# Patient Record
Sex: Female | Born: 1955 | Race: White | Hispanic: No
Health system: Midwestern US, Community
[De-identification: ages and names within clinical notes are randomized; demographics above are authoritative.]

## PROBLEM LIST (undated history)

## (undated) DIAGNOSIS — R0683 Snoring: Secondary | ICD-10-CM

## (undated) DIAGNOSIS — R0602 Shortness of breath: Secondary | ICD-10-CM

## (undated) DIAGNOSIS — I10 Essential (primary) hypertension: Secondary | ICD-10-CM

## (undated) DIAGNOSIS — IMO0002 Reserved for concepts with insufficient information to code with codable children: Secondary | ICD-10-CM

## (undated) DIAGNOSIS — E119 Type 2 diabetes mellitus without complications: Secondary | ICD-10-CM

## (undated) DIAGNOSIS — M25569 Pain in unspecified knee: Secondary | ICD-10-CM

## (undated) DIAGNOSIS — G2581 Restless legs syndrome: Secondary | ICD-10-CM

## (undated) DIAGNOSIS — M2550 Pain in unspecified joint: Secondary | ICD-10-CM

## (undated) DIAGNOSIS — R7303 Prediabetes: Secondary | ICD-10-CM

## (undated) DIAGNOSIS — E559 Vitamin D deficiency, unspecified: Secondary | ICD-10-CM

## (undated) DIAGNOSIS — M5430 Sciatica, unspecified side: Secondary | ICD-10-CM

## (undated) DIAGNOSIS — M797 Fibromyalgia: Secondary | ICD-10-CM

## (undated) DIAGNOSIS — R600 Localized edema: Secondary | ICD-10-CM

## (undated) DIAGNOSIS — M1990 Unspecified osteoarthritis, unspecified site: Secondary | ICD-10-CM

## (undated) DIAGNOSIS — K5900 Constipation, unspecified: Secondary | ICD-10-CM

## (undated) DIAGNOSIS — R519 Headache, unspecified: Secondary | ICD-10-CM

## (undated) DIAGNOSIS — M419 Scoliosis, unspecified: Secondary | ICD-10-CM

## (undated) DIAGNOSIS — F32A Depression, unspecified: Secondary | ICD-10-CM

## (undated) DIAGNOSIS — M549 Dorsalgia, unspecified: Secondary | ICD-10-CM

## (undated) HISTORY — DX: Localized edema: R60.0

## (undated) HISTORY — DX: Pain in unspecified joint: M25.50

## (undated) HISTORY — DX: Unspecified osteoarthritis, unspecified site: M19.90

## (undated) HISTORY — DX: Fibromyalgia: M79.7

## (undated) HISTORY — DX: Vitamin D deficiency, unspecified: E55.9

## (undated) HISTORY — PX: TUBAL LIGATION: SHX77

## (undated) HISTORY — DX: Prediabetes: R73.03

## (undated) HISTORY — PX: ABDOMINAL HYSTERECTOMY: SHX81

## (undated) HISTORY — PX: BACK SURGERY: SHX140

## (undated) HISTORY — DX: Pain in unspecified knee: M25.569

## (undated) HISTORY — DX: Depression, unspecified: F32.A

## (undated) HISTORY — DX: Dorsalgia, unspecified: M54.9

## (undated) HISTORY — DX: Scoliosis, unspecified: M41.9

## (undated) HISTORY — DX: Shortness of breath: R06.02

## (undated) HISTORY — DX: Restless legs syndrome: G25.81

## (undated) HISTORY — DX: Type 2 diabetes mellitus without complications: E11.9

## (undated) HISTORY — DX: Constipation, unspecified: K59.00

---

## 1997-09-01 HISTORY — PX: ABDOMINAL HYSTERECTOMY: SHX81

## 1998-09-01 DIAGNOSIS — D869 Sarcoidosis, unspecified: Secondary | ICD-10-CM

## 1998-09-01 HISTORY — DX: Sarcoidosis, unspecified: D86.9

## 2002-08-30 ENCOUNTER — Emergency Department (HOSPITAL_COMMUNITY): Admission: EM | Admit: 2002-08-30 | Discharge: 2002-08-31 | Payer: Self-pay | Admitting: *Deleted

## 2005-04-17 ENCOUNTER — Ambulatory Visit: Payer: Self-pay | Admitting: Family Medicine

## 2005-04-25 ENCOUNTER — Ambulatory Visit: Payer: Self-pay | Admitting: *Deleted

## 2005-04-25 ENCOUNTER — Ambulatory Visit (HOSPITAL_COMMUNITY): Admission: RE | Admit: 2005-04-25 | Discharge: 2005-04-25 | Payer: Self-pay | Admitting: Family Medicine

## 2005-04-25 ENCOUNTER — Ambulatory Visit: Payer: Self-pay | Admitting: Family Medicine

## 2005-07-02 ENCOUNTER — Encounter (INDEPENDENT_AMBULATORY_CARE_PROVIDER_SITE_OTHER): Payer: Self-pay | Admitting: Internal Medicine

## 2005-07-18 ENCOUNTER — Ambulatory Visit: Payer: Self-pay | Admitting: Family Medicine

## 2005-07-18 ENCOUNTER — Encounter (INDEPENDENT_AMBULATORY_CARE_PROVIDER_SITE_OTHER): Payer: Self-pay | Admitting: Family Medicine

## 2005-07-30 ENCOUNTER — Ambulatory Visit: Payer: Self-pay | Admitting: Internal Medicine

## 2005-09-11 ENCOUNTER — Ambulatory Visit: Payer: Self-pay | Admitting: Family Medicine

## 2005-09-11 ENCOUNTER — Ambulatory Visit (HOSPITAL_COMMUNITY): Admission: RE | Admit: 2005-09-11 | Discharge: 2005-09-11 | Payer: Self-pay | Admitting: Family Medicine

## 2005-09-22 ENCOUNTER — Ambulatory Visit: Payer: Self-pay | Admitting: Family Medicine

## 2005-09-25 ENCOUNTER — Ambulatory Visit (HOSPITAL_COMMUNITY): Admission: RE | Admit: 2005-09-25 | Discharge: 2005-09-25 | Payer: Self-pay | Admitting: Family Medicine

## 2005-10-23 ENCOUNTER — Ambulatory Visit: Payer: Self-pay | Admitting: Family Medicine

## 2006-06-04 ENCOUNTER — Ambulatory Visit: Payer: Self-pay | Admitting: Family Medicine

## 2006-06-25 ENCOUNTER — Ambulatory Visit: Payer: Self-pay | Admitting: Family Medicine

## 2006-07-02 ENCOUNTER — Ambulatory Visit: Payer: Self-pay | Admitting: Family Medicine

## 2007-02-28 ENCOUNTER — Emergency Department (HOSPITAL_COMMUNITY): Admission: EM | Admit: 2007-02-28 | Discharge: 2007-02-28 | Payer: Self-pay | Admitting: Emergency Medicine

## 2007-05-07 ENCOUNTER — Encounter (INDEPENDENT_AMBULATORY_CARE_PROVIDER_SITE_OTHER): Payer: Self-pay | Admitting: Internal Medicine

## 2007-05-13 DIAGNOSIS — K069 Disorder of gingiva and edentulous alveolar ridge, unspecified: Secondary | ICD-10-CM

## 2007-05-13 DIAGNOSIS — K056 Periodontal disease, unspecified: Secondary | ICD-10-CM | POA: Insufficient documentation

## 2007-05-13 DIAGNOSIS — I10 Essential (primary) hypertension: Secondary | ICD-10-CM

## 2007-05-13 DIAGNOSIS — T7840XA Allergy, unspecified, initial encounter: Secondary | ICD-10-CM | POA: Insufficient documentation

## 2007-05-13 DIAGNOSIS — E1159 Type 2 diabetes mellitus with other circulatory complications: Secondary | ICD-10-CM | POA: Insufficient documentation

## 2007-05-13 DIAGNOSIS — R319 Hematuria, unspecified: Secondary | ICD-10-CM | POA: Insufficient documentation

## 2007-05-13 DIAGNOSIS — F329 Major depressive disorder, single episode, unspecified: Secondary | ICD-10-CM | POA: Insufficient documentation

## 2007-05-19 ENCOUNTER — Encounter (INDEPENDENT_AMBULATORY_CARE_PROVIDER_SITE_OTHER): Payer: Self-pay | Admitting: *Deleted

## 2007-12-14 ENCOUNTER — Ambulatory Visit: Payer: Self-pay | Admitting: Family Medicine

## 2007-12-14 ENCOUNTER — Ambulatory Visit (HOSPITAL_COMMUNITY): Admission: RE | Admit: 2007-12-14 | Discharge: 2007-12-14 | Payer: Self-pay | Admitting: Family Medicine

## 2007-12-14 DIAGNOSIS — M542 Cervicalgia: Secondary | ICD-10-CM | POA: Insufficient documentation

## 2007-12-14 DIAGNOSIS — R1084 Generalized abdominal pain: Secondary | ICD-10-CM | POA: Insufficient documentation

## 2007-12-14 DIAGNOSIS — K625 Hemorrhage of anus and rectum: Secondary | ICD-10-CM | POA: Insufficient documentation

## 2007-12-14 LAB — CONVERTED CEMR LAB
Albumin: 4.4 g/dL (ref 3.5–5.2)
BUN: 12 mg/dL (ref 6–23)
Calcium: 10 mg/dL (ref 8.4–10.5)
Chloride: 105 meq/L (ref 96–112)
Glucose, Bld: 104 mg/dL — ABNORMAL HIGH (ref 70–99)
Lymphocytes Relative: 34 % (ref 12–46)
Lymphs Abs: 2.9 10*3/uL (ref 0.7–4.0)
Monocytes Relative: 6 % (ref 3–12)
Neutro Abs: 5 10*3/uL (ref 1.7–7.7)
Neutrophils Relative %: 58 % (ref 43–77)
Potassium: 3.9 meq/L (ref 3.5–5.3)
RBC: 4.78 M/uL (ref 3.87–5.11)
WBC: 8.6 10*3/uL (ref 4.0–10.5)

## 2007-12-21 ENCOUNTER — Ambulatory Visit: Payer: Self-pay | Admitting: Family Medicine

## 2007-12-21 DIAGNOSIS — D869 Sarcoidosis, unspecified: Secondary | ICD-10-CM | POA: Insufficient documentation

## 2007-12-21 DIAGNOSIS — R5381 Other malaise: Secondary | ICD-10-CM | POA: Insufficient documentation

## 2007-12-21 DIAGNOSIS — R5383 Other fatigue: Secondary | ICD-10-CM

## 2007-12-22 ENCOUNTER — Ambulatory Visit (HOSPITAL_COMMUNITY): Admission: RE | Admit: 2007-12-22 | Discharge: 2007-12-22 | Payer: Self-pay | Admitting: Family Medicine

## 2007-12-28 ENCOUNTER — Ambulatory Visit (HOSPITAL_COMMUNITY): Admission: RE | Admit: 2007-12-28 | Discharge: 2007-12-28 | Payer: Self-pay | Admitting: Family Medicine

## 2007-12-28 ENCOUNTER — Encounter (INDEPENDENT_AMBULATORY_CARE_PROVIDER_SITE_OTHER): Payer: Self-pay | Admitting: Family Medicine

## 2008-01-04 ENCOUNTER — Ambulatory Visit: Payer: Self-pay | Admitting: Family Medicine

## 2008-01-04 LAB — CONVERTED CEMR LAB
Rhuematoid fact SerPl-aCnc: <20 intl units/mL (ref 0–20)
Sed Rate: 30 mm/hr — ABNORMAL HIGH (ref 0–22)

## 2008-01-05 ENCOUNTER — Ambulatory Visit (HOSPITAL_COMMUNITY): Admission: RE | Admit: 2008-01-05 | Discharge: 2008-01-05 | Payer: Self-pay | Admitting: Family Medicine

## 2008-01-25 ENCOUNTER — Ambulatory Visit: Payer: Self-pay | Admitting: Family Medicine

## 2008-05-29 ENCOUNTER — Ambulatory Visit: Payer: Self-pay | Admitting: Family Medicine

## 2008-05-29 DIAGNOSIS — M545 Low back pain, unspecified: Secondary | ICD-10-CM | POA: Insufficient documentation

## 2008-06-22 ENCOUNTER — Telehealth (INDEPENDENT_AMBULATORY_CARE_PROVIDER_SITE_OTHER): Payer: Self-pay | Admitting: Family Medicine

## 2008-06-26 ENCOUNTER — Emergency Department (HOSPITAL_COMMUNITY): Admission: EM | Admit: 2008-06-26 | Discharge: 2008-06-26 | Payer: Self-pay | Admitting: Emergency Medicine

## 2008-07-06 ENCOUNTER — Encounter (INDEPENDENT_AMBULATORY_CARE_PROVIDER_SITE_OTHER): Payer: Self-pay | Admitting: *Deleted

## 2008-10-05 ENCOUNTER — Emergency Department (HOSPITAL_COMMUNITY): Admission: EM | Admit: 2008-10-05 | Discharge: 2008-10-05 | Payer: Self-pay | Admitting: Emergency Medicine

## 2010-09-21 ENCOUNTER — Encounter: Payer: Self-pay | Admitting: Family Medicine

## 2010-09-22 ENCOUNTER — Encounter: Payer: Self-pay | Admitting: Family Medicine

## 2013-03-02 ENCOUNTER — Encounter (HOSPITAL_COMMUNITY): Payer: Self-pay | Admitting: Family Medicine

## 2013-03-02 ENCOUNTER — Emergency Department (HOSPITAL_COMMUNITY)
Admission: EM | Admit: 2013-03-02 | Discharge: 2013-03-03 | Disposition: A | Discharge status: Home / Self Care | Payer: Medicaid Other | Attending: Emergency Medicine | Admitting: Emergency Medicine

## 2013-03-02 ENCOUNTER — Emergency Department (HOSPITAL_COMMUNITY): Payer: Medicaid Other

## 2013-03-02 DIAGNOSIS — M545 Low back pain, unspecified: Secondary | ICD-10-CM | POA: Insufficient documentation

## 2013-03-02 DIAGNOSIS — IMO0002 Reserved for concepts with insufficient information to code with codable children: Secondary | ICD-10-CM | POA: Insufficient documentation

## 2013-03-02 DIAGNOSIS — F17200 Nicotine dependence, unspecified, uncomplicated: Secondary | ICD-10-CM | POA: Insufficient documentation

## 2013-03-02 DIAGNOSIS — Z79899 Other long term (current) drug therapy: Secondary | ICD-10-CM | POA: Insufficient documentation

## 2013-03-02 DIAGNOSIS — M5416 Radiculopathy, lumbar region: Secondary | ICD-10-CM

## 2013-03-02 DIAGNOSIS — I10 Essential (primary) hypertension: Secondary | ICD-10-CM | POA: Insufficient documentation

## 2013-03-02 HISTORY — DX: Essential (primary) hypertension: I10

## 2013-03-02 MED ORDER — METHYLPREDNISOLONE 4 MG PO KIT: PACK | ORAL | Status: DC

## 2013-03-02 MED ORDER — MORPHINE SULFATE 4 MG/ML IJ SOLN
4.0000 mg | Freq: Once | INTRAMUSCULAR | Status: AC
  Administered 2013-03-02: 4 mg via INTRAMUSCULAR
  Filled 2013-03-02: qty 1

## 2013-03-02 MED ORDER — HYDROCODONE-ACETAMINOPHEN 5-325 MG PO TABS: ORAL_TABLET | ORAL | Status: DC

## 2013-03-02 MED ORDER — DIAZEPAM 5 MG PO TABS: 5.0000 mg | ORAL_TABLET | Freq: Four times a day (QID) | ORAL | Status: DC | PRN

## 2013-03-02 MED ORDER — DIAZEPAM 5 MG PO TABS
5.0000 mg | ORAL_TABLET | Freq: Once | ORAL | Status: AC
  Administered 2013-03-02: 5 mg via ORAL
  Filled 2013-03-02: qty 1

## 2013-03-02 MED ORDER — ONDANSETRON 4 MG PO TBDP
4.0000 mg | ORAL_TABLET | Freq: Once | ORAL | Status: AC
  Administered 2013-03-02: 4 mg via ORAL
  Filled 2013-03-02: qty 1

## 2013-03-02 NOTE — ED Notes (Addendum)
Patient states that she has been having bilateral leg pain "for 5 months." States her legs "feel like wobbley noddles sometimes." Patient states she has pain with walking and at night her feet are very cold and her legs hurt. Went to a free clinic and was told she needed xrays and be evaluated for blood clots because her legs are sore to the touch.

## 2013-03-02 NOTE — ED Notes (Signed)
Pt ambulated to bathroom with assistance. Pt c/o 9/10 pain while ambulating.

## 2013-03-02 NOTE — ED Provider Notes (Signed)
History    CSN: [id number] Arrival date & time 03/02/13  2141  First MD Initiated Contact with Patient 03/02/13 2159     Chief Complaint  Patient presents with  . Leg Pain   (Consider location/radiation/quality/duration/timing/severity/associated sxs/prior Treatment) HPI  Shannon [name] is a 57 y.o. female complaining of low back pain radiating down with bilateral leg pain from hip to toes worsening over the course of the last 5 months. Patient describes her pain as 8/10, aching, exacerbated by walking and weightbearing. And taking BC powder and Aleve with little relief. She denies any trauma. Patient was seen by family practice clinic in [place] and sent to the ED for "x-rays and tests to rule out blood clots" because she was tender to palpation. Past medical history significant for hypertension, active daily smoker, herniated lumbar discs. Patient denies recent immobilization, exogenous estrogen, calf specific pain, leg swelling, change in bowel or bladder habits, fever, history of IV drug use, history of cancer.   Past Medical History  Diagnosis Date  . Hypertension    Past Surgical History  Procedure Laterality Date  . Abdominal hysterectomy     No family history on file. History  Substance Use Topics  . Smoking status: Current Every Day Smoker -- 1.00 packs/day    Types: Cigarettes  . Smokeless tobacco: Not on file  . Alcohol Use: No   OB History   Grav Para Term Preterm Abortions TAB SAB Ect Mult Living                 Review of Systems  Constitutional: Negative for fever.  Respiratory: Negative for shortness of breath.   Cardiovascular: Negative for chest pain.  Gastrointestinal: Negative for nausea, vomiting, abdominal pain and diarrhea.  Musculoskeletal: Positive for arthralgias.  All other systems reviewed and are negative.    Allergies  Propoxyphene-acetaminophen  Home Medications  No current outpatient prescriptions on file. BP 112/66  Pulse 84   Temp(Src) 98.8 F (37.1 C) (Oral)  Resp 18  SpO2 93% Physical Exam  Nursing note and vitals reviewed. Constitutional: She is oriented to person, place, and time. She appears well-developed and well-nourished. No distress.  HENT:  Head: Normocephalic.  Mouth/Throat: Oropharynx is clear and moist.  Eyes: Conjunctivae and EOM are normal. Pupils are equal, round, and reactive to light.  Neck: Normal range of motion. No JVD present.  Cardiovascular: Normal rate, regular rhythm and intact distal pulses.   Pulmonary/Chest: Effort normal and breath sounds normal. No stridor. No respiratory distress. She has no wheezes. She has no rales. She exhibits no tenderness.  Abdominal: Soft. Bowel sounds are normal. She exhibits no distension and no mass. There is no tenderness. There is no rebound and no guarding.  Musculoskeletal: Normal range of motion. She exhibits no edema and no tenderness.  No calf asymmetry, superficial collaterals, palpable cords, edema, Homans sign negative bilaterally.   Dorsalis pedis 2+ bilaterally, feet are warm, well perfused and cap refill is less than 2 seconds x10 digits  Straight leg raise is positive bilaterally at approximately 40.  Patellar DTRs 2+ bilaterally.  Extensor hallicus longus 5 out of 5 bilaterally   Neurological: She is alert and oriented to person, place, and time.  Psychiatric: She has a normal mood and affect.    ED Course  Procedures (including critical care time) Labs Reviewed - No data to display Dg Lumbar Spine Complete  03/02/2013   *RADIOLOGY REPORT*  Clinical Data: No injury.  Low back pain radiating to  both legs.  LUMBAR SPINE - COMPLETE 4+ VIEW  Comparison: MRI 09/25/2005.  Findings: Grade 1 anterolisthesis of L4 on L5 appears degenerative, and associated with facet arthrosis.  Severe L5-S1 degenerative disc disease with vacuum disc.  L4-L5 vacuum disc is also present. Vertebral body height is preserved.  There is no compression fracture  or acute osseous abnormality.  Notably, there is a mild levoconvex curvature with the apex at L4. Potentially this could be due to spasm or positioning.  This curvature appears little changed compared to the prior abdominal radiograph 04/02/2011.  IMPRESSION: No acute osseous abnormality.  Lumbar spondylosis and facet arthrosis with grade 1 anterolisthesis of L4 on L5.   Original Report Authenticated By: Andreas Newport, M.D.   1. Lumbar radicular pain     MDM   Filed Vitals:   03/02/13 2148  BP: 112/66  Pulse: 84  Temp: 98.8 F (37.1 C)  TempSrc: Oral  Resp: 18  SpO2: 93%     Shannon [name] is a 57 y.o. female  with low back pain and bilateral lower extremity pain ranging from the hip to the toes. Clinically, and I doubt DVT. Likely lumbar radiculopathy. I will treat her with pain control and steroids for anti-inflammatory  Medications  morphine 4 MG/ML injection 4 mg (4 mg Intramuscular Given 03/02/13 2252)  ondansetron (ZOFRAN-ODT) disintegrating tablet 4 mg (4 mg Oral Given 03/02/13 2252)  diazepam (VALIUM) tablet 5 mg (5 mg Oral Given 03/02/13 2351)    Pt is hemodynamically stable, appropriate for, and amenable to discharge at this time. Pt verbalized understanding and agrees with care plan. Outpatient follow-up and specific return precautions discussed.    New Prescriptions   DIAZEPAM (VALIUM) 5 MG TABLET    Take 1 tablet (5 mg total) by mouth every 6 (six) hours as needed (muscle spasms).   HYDROCODONE-ACETAMINOPHEN (NORCO/VICODIN) 5-325 MG PER TABLET    Take 1-2 tablets by mouth every 6 hours as needed for pain.   METHYLPREDNISOLONE (MEDROL DOSEPAK) 4 MG TABLET    As directed by package insert     [employer], PA-C 03/03/13 0000

## 2013-03-03 NOTE — ED Notes (Signed)
Patient is very sleepy after the pain medication. VSS - the patient is d/c'd to home with a family to monitor her pain levels. RR WNL and o2 sats WNL. The patient reports that she wants to go home.

## 2013-03-03 NOTE — ED Provider Notes (Signed)
Medical screening examination/treatment/procedure(s) were performed by non-physician practitioner and as supervising physician I was immediately available for consultation/collaboration.  Flint Melter, MD 03/03/13 [phone]

## 2013-04-04 ENCOUNTER — Emergency Department (HOSPITAL_COMMUNITY)
Admission: EM | Admit: 2013-04-04 | Discharge: 2013-04-04 | Disposition: A | Discharge status: Home / Self Care | Payer: Medicaid Other | Attending: Emergency Medicine | Admitting: Emergency Medicine

## 2013-04-04 ENCOUNTER — Encounter (HOSPITAL_COMMUNITY): Payer: Self-pay | Admitting: Nurse Practitioner

## 2013-04-04 ENCOUNTER — Emergency Department (HOSPITAL_COMMUNITY): Payer: Medicaid Other

## 2013-04-04 DIAGNOSIS — E876 Hypokalemia: Secondary | ICD-10-CM | POA: Insufficient documentation

## 2013-04-04 DIAGNOSIS — I10 Essential (primary) hypertension: Secondary | ICD-10-CM | POA: Insufficient documentation

## 2013-04-04 DIAGNOSIS — F17200 Nicotine dependence, unspecified, uncomplicated: Secondary | ICD-10-CM | POA: Insufficient documentation

## 2013-04-04 DIAGNOSIS — J029 Acute pharyngitis, unspecified: Secondary | ICD-10-CM | POA: Insufficient documentation

## 2013-04-04 DIAGNOSIS — R1310 Dysphagia, unspecified: Secondary | ICD-10-CM | POA: Insufficient documentation

## 2013-04-04 DIAGNOSIS — Z79899 Other long term (current) drug therapy: Secondary | ICD-10-CM | POA: Insufficient documentation

## 2013-04-04 LAB — BASIC METABOLIC PANEL
CO2: 27 mEq/L (ref 19–32)
Chloride: 106 mEq/L (ref 96–112)
Creatinine, Ser: 0.68 mg/dL (ref 0.50–1.10)
Glucose, Bld: 104 mg/dL — ABNORMAL HIGH (ref 70–99)
Sodium: 142 mEq/L (ref 135–145)

## 2013-04-04 LAB — CBC WITH DIFFERENTIAL/PLATELET
Basophils Absolute: 0 10*3/uL (ref 0.0–0.1)
Hemoglobin: 12.6 g/dL (ref 12.0–15.0)
MCH: 29.4 pg (ref 26.0–34.0)
MCHC: 34 g/dL (ref 30.0–36.0)
Monocytes Absolute: 0.5 10*3/uL (ref 0.1–1.0)
Monocytes Relative: 6 % (ref 3–12)
Neutrophils Relative %: 64 % (ref 43–77)
Platelets: 240 10*3/uL (ref 150–400)

## 2013-04-04 MED ORDER — CLINDAMYCIN PHOSPHATE 600 MG/50ML IV SOLN
600.0000 mg | Freq: Once | INTRAVENOUS | Status: AC
  Administered 2013-04-04: 600 mg via INTRAVENOUS
  Filled 2013-04-04: qty 50

## 2013-04-04 MED ORDER — POTASSIUM CHLORIDE CRYS ER 20 MEQ PO TBCR: 20.0000 meq | EXTENDED_RELEASE_TABLET | Freq: Two times a day (BID) | ORAL | Status: DC

## 2013-04-04 MED ORDER — IOHEXOL 300 MG/ML  SOLN
80.0000 mL | Freq: Once | INTRAMUSCULAR | Status: AC | PRN
  Administered 2013-04-04: 80 mL via INTRAVENOUS

## 2013-04-04 MED ORDER — ONDANSETRON HCL 4 MG/2ML IJ SOLN
4.0000 mg | Freq: Once | INTRAMUSCULAR | Status: AC
  Administered 2013-04-04: 4 mg via INTRAVENOUS
  Filled 2013-04-04: qty 2

## 2013-04-04 MED ORDER — ACETAMINOPHEN 160 MG/5ML PO SOLN
325.0000 mg | Freq: Once | ORAL | Status: AC
  Administered 2013-04-04: 325 mg via ORAL

## 2013-04-04 MED ORDER — POTASSIUM CHLORIDE CRYS ER 20 MEQ PO TBCR: 40.0000 meq | EXTENDED_RELEASE_TABLET | Freq: Once | ORAL | Status: DC

## 2013-04-04 MED ORDER — ACETAMINOPHEN-CODEINE 120-12 MG/5ML PO SUSP: 10.0000 mL | Freq: Four times a day (QID) | ORAL | Status: DC | PRN

## 2013-04-04 MED ORDER — CLINDAMYCIN HCL 150 MG PO CAPS: 150.0000 mg | ORAL_CAPSULE | Freq: Four times a day (QID) | ORAL | Status: DC

## 2013-04-04 MED ORDER — POTASSIUM CHLORIDE 20 MEQ/15ML (10%) PO LIQD
40.0000 meq | Freq: Once | ORAL | Status: AC
  Administered 2013-04-04: 40 meq via ORAL
  Filled 2013-04-04 (×2): qty 30

## 2013-04-04 NOTE — ED Provider Notes (Signed)
Medical screening examination/treatment/procedure(s) were performed by non-physician practitioner and as supervising physician I was immediately available for consultation/collaboration.  Lyanne Co, MD 04/04/13 [phone]

## 2013-04-04 NOTE — ED Notes (Signed)
Pt c/o 8/10 burning sensation on right ear and side of face accompanied with headache (hx of migraines). Pt sts also having increased salivation and difficulty swallowing. Pt orientedx4 and arousable with loud speech. Pt resting comfortably in bed hooked up to cardiac and oximetry monitoring.

## 2013-04-04 NOTE — ED Notes (Signed)
Pt. C/o headache. Tiffany PA-C made aware.

## 2013-04-04 NOTE — ED Provider Notes (Signed)
CSN: [id number]     Arrival date & time 04/04/13  1635 History     First MD Initiated Contact with Patient 04/04/13 1659     Chief Complaint  Patient presents with  . lethargic    (Consider location/radiation/quality/duration/timing/severity/associated sxs/prior Treatment) HPI   Rande Dario is a 57 y.o.female with a significant PMH of hypertension presents to the ER by EMS transfer for lethargy. HPI taken from patient who is sedated but will wake up to verbal stimuli. She went to the Urgent Care for right sided sore throat, thick and foul tasting [place] with burning to the right side of her face and intense pain with swallowing. They were concerned that the patient was having a stroke and gave her 0.2 mg of clonidine and 1 mg of Xanax. Pt denies that she was anxious but she said that her BP was high, but she doesn't know how high. After receiving the medications she  Became very sedated at which point she was transferred here to the ED. She has not had any fevers, slurring of speech, difficulty talking, difficulty ambulating or focal weakness.   Past Medical History  Diagnosis Date  . Hypertension    Past Surgical History  Procedure Laterality Date  . Abdominal hysterectomy     No family history on file. History  Substance Use Topics  . Smoking status: Current Every Day Smoker -- 1.00 packs/day    Types: Cigarettes  . Smokeless tobacco: Not on file  . Alcohol Use: No   OB History   Grav Para Term Preterm Abortions TAB SAB Ect Mult Living                 Review of Systems  Constitutional:       Sedated  HENT: Positive for sore throat and trouble swallowing.     Allergies  Propoxyphene-acetaminophen  Home Medications   Current Outpatient Rx  Name  Route  Sig  Dispense  Refill  . amLODipine (NORVASC) 5 MG tablet   Oral   Take 5 mg by mouth daily.         . cloNIDine (CATAPRES) 0.1 MG tablet   Oral   Take 0.1 mg by mouth 2 (two) times daily.         .  diazepam (VALIUM) 5 MG tablet   Oral   Take 5 mg by mouth every 6 (six) hours as needed for anxiety.         Marland Kitchen HYDROcodone-acetaminophen (NORCO/VICODIN) 5-325 MG per tablet   Oral   Take 1 tablet by mouth every 6 (six) hours as needed for pain.         Marland Kitchen lisinopril-hydrochlorothiazide (PRINZIDE,ZESTORETIC) 20-25 MG per tablet   Oral   Take 1 tablet by mouth daily.         . Multiple Vitamin (MULTIVITAMIN WITH MINERALS) TABS   Oral   Take 1 tablet by mouth daily.         . sertraline (ZOLOFT) 100 MG tablet   Oral   Take 100 mg by mouth daily.          BP 111/61  Pulse 65  Temp(Src) 97.6 F (36.4 C) (Oral)  Resp 18  SpO2 95% Physical Exam  Nursing note and vitals reviewed. Constitutional: She is oriented to person, place, and time. She appears well-developed and well-nourished. No distress.  HENT:  Head: Normocephalic and atraumatic. No trismus in the jaw.  Right Ear: Tympanic membrane, external ear and ear canal normal.  Left Ear: Tympanic membrane, external ear and ear canal normal.  Nose: Nose normal. No rhinorrhea. Right sinus exhibits no maxillary sinus tenderness and no frontal sinus tenderness. Left sinus exhibits no maxillary sinus tenderness and no frontal sinus tenderness.  Mouth/Throat: Uvula is midline and mucous membranes are normal. Normal dentition. No dental abscesses or edematous. Oropharyngeal exudate and posterior oropharyngeal edema present. No posterior oropharyngeal erythema or tonsillar abscesses.  No submental edema, tongue not elevated, no trismus. No impending airway obstruction; Pt able to speak full sentences, swallow intact, no drooling, stridor,. No palatal petechia  pts right tonsil is grossly larger than the left and erythematous.    Eyes: Conjunctivae are normal.  Neck: Trachea normal, normal range of motion and full passive range of motion without pain. Neck supple. No rigidity. Normal range of motion present. No Brudzinski's sign  noted.  Flexion and extension of neck without pain or difficulty. Able to breath without difficulty in extension.  Cardiovascular: Normal rate and regular rhythm.   Pulmonary/Chest: Effort normal and breath sounds normal. No stridor. No respiratory distress. She has no wheezes.  Abdominal: Soft. There is no tenderness.  No obvious evidence of splenomegaly. Non ttp.   Musculoskeletal: Normal range of motion.  Lymphadenopathy:       Head (right side): No preauricular and no posterior auricular adenopathy present.       Head (left side): No preauricular and no posterior auricular adenopathy present.    She has cervical adenopathy.  Neurological: She is alert and oriented to person, place, and time.  Skin: Skin is warm and dry. No rash noted. She is not diaphoretic.  Psychiatric: She has a normal mood and affect.    ED Course   Procedures (including critical care time)  Labs Reviewed  BASIC METABOLIC PANEL - Abnormal; Notable for the following:    Potassium 2.9 (*)    Glucose, Bld 104 (*)    All other components within normal limits  CBC WITH DIFFERENTIAL   Ct Soft Tissue Neck W Contrast  04/04/2013   *RADIOLOGY REPORT*  Clinical Data: Right-sided asymmetric tonsil swelling.  Difficult swallowing over past 2 days.  CT NECK WITH CONTRAST  Technique:  Multidetector CT imaging of the neck was performed with intravenous contrast.  Contrast: 80mL OMNIPAQUE IOHEXOL 300 MG/ML  SOLN  Comparison: None.  Findings: Mild prominence of the lymphoid tissue of Waldeyers ring. This includes slightly prominent palatine tonsils which may indicate result of mild inflammation however, no drainable abscess is noted.  Direct visualization would be necessary to exclude mucosal abnormality.  No inflammatory process noted involving the parapharyngeal space.  Scattered normal to slightly enlarged lymph nodes largest in the level II region measuring up to 1.2 cm short axis dimension.  Poor dentition.  Partial  opacification anterior right ethmoid sinus air cells.  Lung apices without worrisome mass identified.  Mild cervical spondylotic changes most notable C5-6.  IMPRESSION: Mild prominence of the lymphoid tissue of Waldeyers ring.  This includes slightly prominent palatine tonsils which may indicate result of mild inflammation however, no drainable abscess is noted. No inflammatory process noted involving the parapharyngeal space.  Scattered normal to slightly enlarged lymph nodes largest in the level II region measuring up to 1.2 cm short axis dimension.  Poor dentition.   Original Report Authenticated By: Lacy Duverney, M.D.   1. Pharyngitis     MDM  CT scan is negative for peritonsillar abscess. But does show infection and inflamed lymph nodes. The patient is still  sleepy but stays awake and has full conversations and is oriented x 4. She would like to go home now. She said this happened before when they gave her 4 mg Morphine at [facility], Husband, niece and sister are here and feel comfortable with her going home. Patient wants to go home.   She received 600 mg IV of Clindamycin.  Rx:  Clindamycin Tylenol # 3 Potassium    56 y.o.Emmalynne Epps's evaluation in the Emergency Department is complete. It has been determined that no acute conditions requiring further emergency intervention are present at this time. The patient/guardian have been advised of the diagnosis and plan. We have discussed signs and symptoms that warrant return to the ED, such as changes or worsening in symptoms.  Vital signs are stable at discharge. Filed Vitals:   04/04/13 2030  BP: 135/85  Pulse: 59  Temp:   Resp:     Patient/guardian has voiced understanding and agreed to follow-up with the PCP or specialist.   Dorthula Matas, PA-C 04/04/13 2056

## 2013-04-04 NOTE — ED Notes (Signed)
Per EMS pt from [facility] on [address] being seen Right sided facial burning and thick saliva. [facility] gave pt 0.2 of clonidine and 1mg  of xanax. Pt became increasing lethargic. Sent here. SpO2 98%/RA BP 130/80 HR 70

## 2013-04-04 NOTE — ED Provider Notes (Signed)
Date: 04/04/2013  Rate: 67  Rhythm: normal sinus rhythm  QRS Axis: normal  Intervals: normal  ST/T Wave abnormalities: normal  Conduction Disutrbances: none  Narrative Interpretation:   Old EKG Reviewed: No significant changes noted     Lyanne Co, MD 04/04/13 2046

## 2013-10-27 ENCOUNTER — Ambulatory Visit: Payer: Medicaid Other

## 2013-11-07 ENCOUNTER — Ambulatory Visit: Payer: Medicaid Other | Attending: Neurosurgery

## 2013-11-07 DIAGNOSIS — M545 Low back pain, unspecified: Secondary | ICD-10-CM | POA: Insufficient documentation

## 2013-11-07 DIAGNOSIS — IMO0001 Reserved for inherently not codable concepts without codable children: Secondary | ICD-10-CM | POA: Insufficient documentation

## 2013-11-07 DIAGNOSIS — R5381 Other malaise: Secondary | ICD-10-CM | POA: Insufficient documentation

## 2013-11-07 DIAGNOSIS — R293 Abnormal posture: Secondary | ICD-10-CM | POA: Insufficient documentation

## 2013-11-07 DIAGNOSIS — R262 Difficulty in walking, not elsewhere classified: Secondary | ICD-10-CM | POA: Insufficient documentation

## 2013-11-07 DIAGNOSIS — R269 Unspecified abnormalities of gait and mobility: Secondary | ICD-10-CM | POA: Insufficient documentation

## 2014-01-01 ENCOUNTER — Encounter (HOSPITAL_COMMUNITY): Payer: Self-pay | Admitting: Emergency Medicine

## 2014-01-01 ENCOUNTER — Emergency Department (HOSPITAL_COMMUNITY)
Admission: EM | Admit: 2014-01-01 | Discharge: 2014-01-01 | Disposition: A | Discharge status: Home / Self Care | Payer: Medicaid Other | Attending: Emergency Medicine | Admitting: Emergency Medicine

## 2014-01-01 ENCOUNTER — Emergency Department (HOSPITAL_COMMUNITY): Payer: Medicaid Other

## 2014-01-01 DIAGNOSIS — Z8739 Personal history of other diseases of the musculoskeletal system and connective tissue: Secondary | ICD-10-CM | POA: Insufficient documentation

## 2014-01-01 DIAGNOSIS — K08109 Complete loss of teeth, unspecified cause, unspecified class: Secondary | ICD-10-CM | POA: Insufficient documentation

## 2014-01-01 DIAGNOSIS — I10 Essential (primary) hypertension: Secondary | ICD-10-CM | POA: Insufficient documentation

## 2014-01-01 DIAGNOSIS — J029 Acute pharyngitis, unspecified: Secondary | ICD-10-CM | POA: Insufficient documentation

## 2014-01-01 DIAGNOSIS — K047 Periapical abscess without sinus: Secondary | ICD-10-CM | POA: Insufficient documentation

## 2014-01-01 DIAGNOSIS — F17200 Nicotine dependence, unspecified, uncomplicated: Secondary | ICD-10-CM | POA: Insufficient documentation

## 2014-01-01 DIAGNOSIS — Z79899 Other long term (current) drug therapy: Secondary | ICD-10-CM | POA: Insufficient documentation

## 2014-01-01 DIAGNOSIS — K029 Dental caries, unspecified: Secondary | ICD-10-CM | POA: Insufficient documentation

## 2014-01-01 DIAGNOSIS — K0889 Other specified disorders of teeth and supporting structures: Secondary | ICD-10-CM

## 2014-01-01 DIAGNOSIS — E876 Hypokalemia: Secondary | ICD-10-CM | POA: Insufficient documentation

## 2014-01-01 DIAGNOSIS — K0381 Cracked tooth: Secondary | ICD-10-CM | POA: Insufficient documentation

## 2014-01-01 DIAGNOSIS — K002 Abnormalities of size and form of teeth: Secondary | ICD-10-CM | POA: Insufficient documentation

## 2014-01-01 HISTORY — DX: Sciatica, unspecified side: M54.30

## 2014-01-01 HISTORY — DX: Reserved for concepts with insufficient information to code with codable children: IMO0002

## 2014-01-01 LAB — CBC
HCT: 45.1 % (ref 36.0–46.0)
HEMOGLOBIN: 15.2 g/dL — AB (ref 12.0–15.0)
MCH: 29.1 pg (ref 26.0–34.0)
MCHC: 33.7 g/dL (ref 30.0–36.0)
MCV: 86.4 fL (ref 78.0–100.0)
PLATELETS: 261 10*3/uL (ref 150–400)
RBC: 5.22 MIL/uL — ABNORMAL HIGH (ref 3.87–5.11)
RDW: 13.6 % (ref 11.5–15.5)
WBC: 9.5 10*3/uL (ref 4.0–10.5)

## 2014-01-01 LAB — I-STAT CHEM 8, ED
BUN: 8 mg/dL (ref 6–23)
Calcium, Ion: 1.16 mmol/L (ref 1.12–1.23)
Chloride: 104 mEq/L (ref 96–112)
Creatinine, Ser: 0.9 mg/dL (ref 0.50–1.10)
Glucose, Bld: 112 mg/dL — ABNORMAL HIGH (ref 70–99)
HEMATOCRIT: 49 % — AB (ref 36.0–46.0)
HEMOGLOBIN: 16.7 g/dL — AB (ref 12.0–15.0)
POTASSIUM: 3.6 meq/L — AB (ref 3.7–5.3)
SODIUM: 142 meq/L (ref 137–147)
TCO2: 23 mmol/L (ref 0–100)

## 2014-01-01 MED ORDER — ONDANSETRON 4 MG PO TBDP: 4.0000 mg | ORAL_TABLET | Freq: Once | ORAL | Status: DC

## 2014-01-01 MED ORDER — ACETAMINOPHEN 325 MG PO TABS
325.0000 mg | ORAL_TABLET | Freq: Once | ORAL | Status: AC
  Administered 2014-01-01: 325 mg via ORAL
  Filled 2014-01-01: qty 1

## 2014-01-01 MED ORDER — CLINDAMYCIN PHOSPHATE 600 MG/50ML IV SOLN: 600.0000 mg | Freq: Once | INTRAVENOUS | Status: DC

## 2014-01-01 MED ORDER — HYDROCODONE-ACETAMINOPHEN 5-325 MG PO TABS: 1.0000 | ORAL_TABLET | Freq: Once | ORAL | Status: DC

## 2014-01-01 MED ORDER — CLINDAMYCIN HCL 150 MG PO CAPS: 450.0000 mg | ORAL_CAPSULE | Freq: Four times a day (QID) | ORAL | Status: DC

## 2014-01-01 MED ORDER — ONDANSETRON 4 MG PO TBDP
4.0000 mg | ORAL_TABLET | Freq: Once | ORAL | Status: AC
  Administered 2014-01-01: 4 mg via ORAL
  Filled 2014-01-01: qty 1

## 2014-01-01 MED ORDER — ACETAMINOPHEN 325 MG PO TABS: 325.0000 mg | ORAL_TABLET | Freq: Four times a day (QID) | ORAL | Status: DC | PRN

## 2014-01-01 MED ORDER — ACETAMINOPHEN 325 MG PO TABS: 325.0000 mg | ORAL_TABLET | Freq: Once | ORAL | Status: DC

## 2014-01-01 MED ORDER — CLINDAMYCIN HCL 300 MG PO CAPS
450.0000 mg | ORAL_CAPSULE | Freq: Once | ORAL | Status: AC
  Administered 2014-01-01: 450 mg via ORAL
  Filled 2014-01-01: qty 1

## 2014-01-01 MED ORDER — IOHEXOL 300 MG/ML  SOLN
100.0000 mL | Freq: Once | INTRAMUSCULAR | Status: AC | PRN
  Administered 2014-01-01: 100 mL via INTRAVENOUS

## 2014-01-01 MED ORDER — ONDANSETRON HCL 4 MG PO TABS: 4.0000 mg | ORAL_TABLET | Freq: Four times a day (QID) | ORAL | Status: DC

## 2014-01-01 NOTE — Discharge Instructions (Signed)
Please call and set-up an appointment with dentist-teeth need to be extracted/removed, only way for full relief. Please take antibiotics as prescribed and on a full stomach - please finish a full round of antibiotics Please take medications as prescribed-no more than 4000 mg/4 g of Tylenol per day Please take antinausea medications as prescribed Please apply warm and cool compressions Please continue to monitor symptoms closely if symptoms are to worsen or change (fever greater than 101, chills, swelling to the face, nausea, vomiting, neck pain, neck stiffness, chest pain, shortness of breath, difficulty breathing, worsening or changes to pain symptoms, visual changes, blurred vision, redness, hot to the touch of the face) please report back to the ED immediately   Dental Abscess A dental abscess is a collection of infected fluid (pus) from a bacterial infection in the inner part of the tooth (pulp). It usually occurs at the end of the tooth's root.  CAUSES   Severe tooth decay.  Trauma to the tooth that allows bacteria to enter into the pulp, such as a broken or chipped tooth. SYMPTOMS   Severe pain in and around the infected tooth.  Swelling and redness around the abscessed tooth or in the mouth or face.  Tenderness.  Pus drainage.  Bad breath.  Bitter taste in the mouth.  Difficulty swallowing.  Difficulty opening the mouth.  Nausea.  Vomiting.  Chills.  Swollen neck glands. DIAGNOSIS   A medical and dental history will be taken.  An examination will be performed by tapping on the abscessed tooth.  X-rays may be taken of the tooth to identify the abscess. TREATMENT The goal of treatment is to eliminate the infection. You may be prescribed antibiotic medicine to stop the infection from spreading. A root canal may be performed to save the tooth. If the tooth cannot be saved, it may be pulled (extracted) and the abscess may be drained.  HOME CARE INSTRUCTIONS  Only  take over-the-counter or prescription medicines for pain, fever, or discomfort as directed by your caregiver.  Rinse your mouth (gargle) often with salt water ( tsp salt in 8 oz [250 ml] of warm water) to relieve pain or swelling.  Do not drive after taking pain medicine (narcotics).  Do not apply heat to the outside of your face.  Return to your dentist for further treatment as directed. SEEK MEDICAL CARE IF:  Your pain is not helped by medicine.  Your pain is getting worse instead of better. SEEK IMMEDIATE MEDICAL CARE IF:  You have a fever or persistent symptoms for more than 2 3 days.  You have a fever and your symptoms suddenly get worse.  You have chills or a very bad headache.  You have problems breathing or swallowing.  You have trouble opening your mouth.  You have swelling in the neck or around the eye. Document Released: 08/18/2005 Document Revised: 05/12/2012 Document Reviewed: 11/26/2010 [facility] Patient Information 2014 [place], [place].   Emergency Department Resource Guide 1) Find a Doctor and Pay Out of Pocket Although you won't have to find out who is covered by your insurance plan, it is a good idea to ask around and get recommendations. You will then need to call the office and see if the doctor you have chosen will accept you as a new patient and what types of options they offer for patients who are self-pay. Some doctors offer discounts or will set up payment plans for their patients who do not have insurance, but you will need to  ask so you aren't surprised when you get to your appointment.  2) Contact Your Local Health Department Not all health departments have doctors that can see patients for sick visits, but many do, so it is worth a call to see if yours does. If you don't know where your local health department is, you can check in your phone book. The CDC also has a tool to help you locate your state's health department, and many state websites also  have listings of all of their local health departments.  3) Find a [facility] If your illness is not likely to be very severe or complicated, you may want to try a walk in clinic. These are popping up all over the country in pharmacies, drugstores, and shopping centers. They're usually staffed by nurse practitioners or physician assistants that have been trained to treat common illnesses and complaints. They're usually fairly quick and inexpensive. However, if you have serious medical issues or chronic medical problems, these are probably not your best option.  No Primary Care Doctor: - Call Health Connect at  [phone] - they can help you locate a primary care doctor that  accepts your insurance, provides certain services, etc. - Physician Referral Service- [phone]  Chronic Pain Problems: Organization         Address  Phone   Notes  [facility]  [phone] Patients need to be referred by their primary care doctor.   Medication Assistance: Organization         Address  Phone   Notes  [facility] Medication [facility] [address] [place], [place] [address] [phone] --Must be a resident of [facility] -- Must have NO insurance coverage whatsoever (no Medicaid/ Medicare, etc.) -- The pt. MUST have a primary care doctor that directs their care regularly and follows them in the community   [facility]ist  [phone]   [employer]  [phone]    Agencies that provide inexpensive medical care: Organization         Address  Phone   Notes  [place]  [phone]   [facility]    [phone]   [facility] [place], [place] [address] [phone]   [facility] [address], [place] [phone]   Planned Parenthood    [phone]   [facility]    [phone]   [place] [address] [place] [address], [place] Phone:  [phone], Fax:  [phone] Hours of Operation:  9 am - 6 pm, M-F.  Also accepts Medicaid/Medicare and self-pay.  [facility] [place], [address], [place] Phone: [phone], Fax: [phone]. Hours of Operation:  8:30 am - 5:30 pm, M-F.  Also accepts Medicaid and self-pay.  [facility] [place] [address] [place] Phone: [phone]   [place], [place], [place] [phone], Ext. [phone] Mondays & Thursdays: 7-9 AM.  First 15 patients are seen on a first come, first serve basis.    [place] Providers:  Organization         Address  Phone   Notes  [facility] [address], [place] [phone] Also accepts self-pay patients.  [facility] [address], [place]  [phone])  [place], [address], [place] [phone]   [place] [address] [place] [phone]   Lucianne Lei [address], [place]   [phone] Only accepts [place] Access [place] patients after they have their name applied to their card.   Self-Pay (no insurance) in [facility]:  Organization         Address  Phone   Notes  Sickle Cell Patients, [facility] [place] [phone]   [facility] [place] [phone]   [facility] [place]  [place], [place], [place] [phone]   [facility]/Dr. Osei-Bonsu  [address] [place] or [address], [place] [phone] Phone number for both [place] and [place] locations is the same.  [facility] [address] [place] [phone]   [facility] [address]  [place] or [address] [phone]   [facility] [address], [place] [phone], phone; [phone], fax Sees patients 1st and 3rd Saturday of every month.  Must not qualify for public or private insurance (i.e. Medicaid, Medicare, [place] Health Choice, Veterans' Benefits)  Household income should be no more than 200% of the poverty level The clinic cannot treat you if you are pregnant or think you are pregnant  Sexually transmitted diseases are not treated at the clinic.    Dental Care: Organization         Address  Phone  Notes  [facility] Department of [facility] [place] [phone] Accepts children up to age 67 who are enrolled in [place] or [place]; pregnant women with a Medicaid card; and children who have applied for Medicaid or [place] Health Choice, but were declined, whose parents can pay a reduced fee at time of service.  [facility] Department of [facility]  [address], [place] [phone] Accepts children up to age 83 who are enrolled in [place] or [place]; pregnant women with a Medicaid card; and children who have applied for Medicaid or [place] Health Choice, but were declined, whose parents can pay a reduced fee at time of service.  [facility]  [place] [phone] Patients are seen by appointment only. Walk-ins are not accepted. [place] will see patients 9 years of age and older. Monday - Tuesday (8am-5pm) Most Wednesdays (8:30-5pm) $30 per visit, cash only  [facility] Adult Dental Access PROGRAM  [address], [facility] [phone] Patients are seen by appointment only. Walk-ins are not accepted. [place] will see patients 23 years of age and older. One Wednesday Evening (Monthly: Volunteer Based).  $30 per visit, cash only  [place]  [phone] for adults; Children under age 44, call Graduate Pediatric Dentistry at [phone]. Children aged 38-14, please call [phone] to request a pediatric application.  Dental services are provided in all areas of dental care including fillings, crowns and bridges, complete and partial dentures, implants, gum treatment, root canals, and extractions. Preventive care is also provided. Treatment is provided to both adults and children. Patients are selected via a lottery and there is often  a waiting list.   [facility] [address] [place]  [phone] [email or web address]   [facility] [address] [place], [place] [phone], Ext. [phone] Second and Fourth Thursday of each month, opens at 6:30 AM; Clinic ends at 9 AM.  Patients are seen on a first-come first-served basis, and a limited number are seen during each clinic.   [facility]  [address] Ether Griffins [place], [place] [phone]   Eligibility Requirements You must have lived in [place], [place], or [place] counties for at least the last three months.   You cannot be eligible for state or federal sponsored [place], including [employer], [place], or [employer].   You generally cannot be eligible for healthcare insurance through your employer.    How to apply: Eligibility screenings are held every Tuesday and Wednesday afternoon from 1:00 pm until 4:00 pm. You do not need an appointment for the interview!  [facility] [address] [place], [place] [phone]   [facility] Health Department  [phone]   [facility] Health Department  [phone]   [facility] Health Department  [phone]    Behavioral Health Resources in the Community: Intensive Outpatient Programs Organization         Address  Phone  Notes  [facility] [address] [place], [place] [phone]     [facility] Outpatient [address] [place], [place] [phone]   [facility] [address], [place], [place]  [phone]   [facility] Mental Health [address],  [place], [place] [phone] or [phone]   Substance Abuse Resources Organization         Address  Phone  Notes  Alcohol [facility] Drug Services  [phone]   [facility]  [phone]   [facility]  [phone]   Floydene Flock  [phone]   Residential & Outpatient Substance Abuse Program  [phone]   Psychological Services Organization         Address  Phone  Notes  [facility] Behavioral Health  [phone]   [facility] Services  [phone]   [facility] Mental Health [address] [place] [phone] or [phone]    Mobile Crisis Teams Organization         Address  Phone  Notes  [facility], Mobile Crisis Care Unit  [phone]   [facility]  [address] [place], [place] [phone]   Doristine Locks [address] [place] [phone]    Self-Help/Support Groups Organization         Address  Phone             Notes  [facility] [place] - variety of support groups  [phone]- [phone] Call for more information  Narcotics Anonymous (NA), [facility] [address], [employer] [place]  2 meetings at this location   Statistician         Address  Phone  Notes  [facility] [address],    [place]  [phone]   [facility]  [address], [place] [address], [place], [place] [phone]   [facility] [address] [place], [place] [phone] Admissions: 8am-3pm M-F  [facility] [address],    [place], [place] [phone]   [facility] [address] [place], [place], [place] [phone]  [facility] [address],  [place],  [place]   [facility] - Intensive Outpatient [address], [place], [place]   [facility] [place].,  [place], [place] [phone] or [phone]   [facility] (RTS) [address] [place], [place] Accepts Medicaid  [facility] [place] [address],  [place] [phone] Substance Abuse/Addiction Treatment   [facility] Organization         Address  Phone  Notes  [facility]  [phone]   Domenic Schwab, PhD [address] Arlis Porta [place], [place]   [phone] or [phone]   [place], [place] [phone]   [address], [place], [place] [phone] Insurance/Medicaid/sponsorship through [facility] and Families [address]                                    [place], [place] [phone] [place] [address], [place] [phone]    Dr. Adele Schilder  [phone]   [facility] Dept. 1) [address] [place] 2) [place] 3)  [place] [address], [place] [phone]  [phone]   [place] [phone] or [phone] (After Hours)

## 2014-01-01 NOTE — ED Notes (Signed)
Pt c/o dental pain x several months.  States that it got worse yesterday

## 2014-01-01 NOTE — ED Provider Notes (Signed)
CSN: [id number]     Arrival date & time 01/01/14  1028 History   First MD Initiated Contact with Patient 01/01/14 1028     Chief Complaint  Patient presents with  . Dental Pain     (Consider location/radiation/quality/duration/timing/severity/associated sxs/prior Treatment) The history is provided by the patient. No language interpreter was used.  Shannon [name] is a 58 year old female with past medical history of hypertension, sciatica, herniated discs presenting to the ED with dental pain that started yesterday. Patient reported that she last seen her dentist approximately one month ago when she was due to get her teeth extracted, reported that she did not get procedure performed secondary to insurance issues. Patient reports a muscular discomfort is localized to the right side of the maxillary gum line described as a throbbing sensation with radiation towards bilateral aspects of her neck. Stated that the pain is constant. Reported that she has used salt water to gargle without much relief. Stated that she has felt some swelling. Denied dentures, numbness, tingling, blurred vision, sudden loss of vision, neck pain, neck stiffness, chest pain, shortness of breath, difficulty breathing, bleeding, discharge, abnormal taste in mouth. PCP none   Past Medical History  Diagnosis Date  . Hypertension   . Sciatica   . Herniated disc    Past Surgical History  Procedure Laterality Date  . Abdominal hysterectomy     No family history on file. History  Substance Use Topics  . Smoking status: Current Every Day Smoker -- 1.00 packs/day    Types: Cigarettes  . Smokeless tobacco: Not on file  . Alcohol Use: No   OB History   Grav Para Term Preterm Abortions TAB SAB Ect Mult Living                 Review of Systems  Constitutional: Negative for fever and chills.  HENT: Positive for dental problem, sore throat and trouble swallowing.   Respiratory: Negative for shortness of breath.     Cardiovascular: Negative for chest pain.  Neurological: Negative for numbness.  All other systems reviewed and are negative.     Allergies  Propoxyphene n-acetaminophen  Home Medications   Prior to Admission medications   Medication Sig Start Date End Date Taking? Authorizing Provider  acetaminophen-codeine 120-12 MG/5ML suspension Take 10 mLs by mouth every 6 (six) hours as needed for pain. 04/04/13   Tiffany Marilu Favre, PA-C  amLODipine (NORVASC) 5 MG tablet Take 5 mg by mouth daily.    Historical Provider, MD  clindamycin (CLEOCIN) 150 MG capsule Take 1 capsule (150 mg total) by mouth every 6 (six) hours. 04/04/13   Linus Mako, PA-C  cloNIDine (CATAPRES) 0.1 MG tablet Take 0.1 mg by mouth 2 (two) times daily.    Historical Provider, MD  diazepam (VALIUM) 5 MG tablet Take 5 mg by mouth every 6 (six) hours as needed for anxiety.    Historical Provider, MD  HYDROcodone-acetaminophen (NORCO/VICODIN) 5-325 MG per tablet Take 1 tablet by mouth every 6 (six) hours as needed for pain.    Historical Provider, MD  lisinopril-hydrochlorothiazide (PRINZIDE,ZESTORETIC) 20-25 MG per tablet Take 1 tablet by mouth daily.    Historical Provider, MD  Multiple Vitamin (MULTIVITAMIN WITH MINERALS) TABS Take 1 tablet by mouth daily.    Historical Provider, MD  potassium chloride SA (K-DUR,KLOR-CON) 20 MEQ tablet Take 1 tablet (20 mEq total) by mouth 2 (two) times daily. 04/04/13   Linus Mako, PA-C  sertraline (ZOLOFT) 100 MG tablet Take 100  mg by mouth daily.    Historical Provider, MD   BP 158/91  Pulse 92  Temp(Src) 98.5 F (36.9 C)  Resp 18  SpO2 100% Physical Exam  Nursing note and vitals reviewed. Constitutional: She is oriented to person, place, and time. She appears well-developed and well-nourished. No distress.  HENT:  Head: Normocephalic and atraumatic.  Mouth/Throat: No oropharyngeal exudate.  Negative swelling or facial cellulitis noted.  Poor dentition noted with missing  maxillary teeth - most teeth have been broken off and in decaying process. Swelling and erythema noted to the gums surrounding the right maxillary canine and first premolar region. Negative active bleeding or drainage noted.  Discomfort upon palpation. Negative trismus noted. Uvula midline with symmetrical elevation. Negative sublingual lesions noted.   Eyes: Conjunctivae and EOM are normal. Pupils are equal, round, and reactive to light. Right eye exhibits no discharge. Left eye exhibits no discharge.  Neck: Normal range of motion. Neck supple. No tracheal deviation present.    Negative neck stiffness Negative nuchal rigidity Negative pain upon palpation to the c-spine Negative meningismus Mild tonsillar adenopathy noted bilaterally  Discomfort upon palpation to the anterior and left side of the neck   Cardiovascular: Normal rate, regular rhythm and normal heart sounds.  Exam reveals no friction rub.   No murmur heard. Pulmonary/Chest: Effort normal and breath sounds normal. No respiratory distress. She has no wheezes. She has no rales.  Musculoskeletal: Normal range of motion.  Full ROM to upper and lower extremities without difficulty noted, negative ataxia noted.  Lymphadenopathy:    She has cervical adenopathy.  Neurological: She is alert and oriented to person, place, and time. No cranial nerve deficit. She exhibits normal muscle tone. Coordination normal.  Cranial nerves III-XII grossly intact  Skin: Skin is warm and dry. No rash noted. She is not diaphoretic. No erythema.  Psychiatric: She has a normal mood and affect. Her behavior is normal. Thought content normal.    ED Course  Procedures (including critical care time)  10:57 AM This provider spoke with CT tech who recommended CT soft tissue with contrast to be ordered, this image can get teeth as well.   Labs Review Labs Reviewed  CBC - Abnormal; Notable for the following:    RBC 5.22 (*)    Hemoglobin 15.2 (*)    All  other components within normal limits  I-STAT CHEM 8, ED - Abnormal; Notable for the following:    Potassium 3.6 (*)    Glucose, Bld 112 (*)    Hemoglobin 16.7 (*)    HCT 49.0 (*)    All other components within normal limits    Imaging Review Ct Soft Tissue Neck W Contrast  01/01/2014   CLINICAL DATA:  Teeth pain  EXAM: CT NECK WITH CONTRAST  TECHNIQUE: Multidetector CT imaging of the neck was performed using the standard protocol following the bolus administration of intravenous contrast.  CONTRAST:  186mL OMNIPAQUE IOHEXOL 300 MG/ML  SOLN  COMPARISON:  04/04/2013  FINDINGS: The visualized intracranial contents appear normal. And parotid glands appear normal. The vascular structures of the neck are patent. There is a low attenuation nodule in the right lobe of thyroid gland measuring 7 mm. Cervical lymph nodes. No soft tissue fluid collections or mass identified. The paranasal sinuses are clear. Very poor dentition. There are several right upper periapical abscess ease involving the first bicuspid, first molar, and second molar. Right lower second bicuspid periapical abscesses noted.  IMPRESSION: 1. Multiple right upper and  lower periapical abscesses which may account for the patient's dental symptoms. 2. No soft tissue abscess identified.   Electronically Signed   By: Kerby Moors M.D.   On: 01/01/2014 12:40     EKG Interpretation None      MDM   Final diagnoses:  Periapical abscess  Pain, dental  Hypokalemia   Medications  ondansetron (ZOFRAN-ODT) disintegrating tablet 4 mg (not administered)  clindamycin (CLEOCIN) capsule 450 mg (not administered)  acetaminophen (TYLENOL) tablet 325 mg (not administered)  iohexol (OMNIPAQUE) 300 MG/ML solution 100 mL (100 mLs Intravenous Contrast Given 01/01/14 1229)   Filed Vitals:   01/01/14 1035  BP: 158/91  Pulse: 92  Temp: 98.5 F (36.9 C)  Resp: 18  SpO2: 100%    CBC negative elevated white blood cell count identified. Chem-8 -  kidney functioning well. Mild hypokalemia 3.6. CT soft tissue identified multiple right upper and lower periapical abscess these which may account for the patient's dental symptoms. No soft tissue abscess identified. Negative findings of peritonsillar abscess. Negative findings of retropharyngeal abscess. Periapical abscess ease identified to the right upper and lower gumline-poor dentition noted with numerous teeth diagrammed in missing, all teeth are in decaying process. No identified drainable abscess identified in the gumline. Dose of antibiotics given in ED setting. Patient is not septic appearing-no elevated white blood cell count, leukocytosis. Patient stable, afebrile. Patient no acute respiratory distress. Discharged patient. Discharged patient with antibiotics, Tylenol, antibiotics. Discussed with patient to apply warm compressions. Referred patient to dentist and oral surgeon. Discussed with patient importance of following up with a dentist. Discussed with patient to closely monitor symptoms and if symptoms are to worsen or change to report back to the ED - strict return instructions given.  Patient agreed to plan of care, understood, all questions answered.   Jamse Mead, PA-C 01/01/14 2053

## 2014-01-03 NOTE — ED Provider Notes (Signed)
Medical screening examination/treatment/procedure(s) were performed by non-physician practitioner and as supervising physician I was immediately available for consultation/collaboration.   EKG Interpretation None        Mirna Mires, MD 01/03/14 [phone]

## 2014-01-12 ENCOUNTER — Other Ambulatory Visit: Payer: Self-pay | Admitting: Neurosurgery

## 2014-01-12 DIAGNOSIS — M4316 Spondylolisthesis, lumbar region: Secondary | ICD-10-CM

## 2014-01-15 ENCOUNTER — Ambulatory Visit
Admission: RE | Admit: 2014-01-15 | Discharge: 2014-01-15 | Disposition: A | Discharge status: Home / Self Care | Payer: Medicaid Other | Source: Ambulatory Visit | Attending: Neurosurgery | Admitting: Neurosurgery

## 2014-01-15 DIAGNOSIS — M4316 Spondylolisthesis, lumbar region: Secondary | ICD-10-CM

## 2014-08-28 ENCOUNTER — Emergency Department (HOSPITAL_COMMUNITY)
Admission: EM | Admit: 2014-08-28 | Discharge: 2014-08-28 | Disposition: A | Discharge status: Home / Self Care | Payer: Medicaid Other | Attending: Emergency Medicine | Admitting: Emergency Medicine

## 2014-08-28 ENCOUNTER — Emergency Department (HOSPITAL_COMMUNITY): Payer: Medicaid Other

## 2014-08-28 ENCOUNTER — Encounter (HOSPITAL_COMMUNITY): Payer: Self-pay

## 2014-08-28 DIAGNOSIS — M1990 Unspecified osteoarthritis, unspecified site: Secondary | ICD-10-CM | POA: Diagnosis not present

## 2014-08-28 DIAGNOSIS — R52 Pain, unspecified: Secondary | ICD-10-CM

## 2014-08-28 DIAGNOSIS — I10 Essential (primary) hypertension: Secondary | ICD-10-CM | POA: Diagnosis not present

## 2014-08-28 DIAGNOSIS — M19042 Primary osteoarthritis, left hand: Secondary | ICD-10-CM

## 2014-08-28 DIAGNOSIS — Z792 Long term (current) use of antibiotics: Secondary | ICD-10-CM | POA: Diagnosis not present

## 2014-08-28 DIAGNOSIS — Z79899 Other long term (current) drug therapy: Secondary | ICD-10-CM | POA: Insufficient documentation

## 2014-08-28 DIAGNOSIS — M79642 Pain in left hand: Secondary | ICD-10-CM | POA: Diagnosis present

## 2014-08-28 MED ORDER — HYDROCODONE-ACETAMINOPHEN 5-325 MG PO TABS: ORAL_TABLET | ORAL | Status: DC

## 2014-08-28 NOTE — Discharge Instructions (Signed)
Rest, Ice intermittently (in the first 24-48 hours), Gentle compression with an Ace wrap, and elevate (Limb above the level of the heart)   Take up to 400mg  of ibuprofen (that is usually 4 over the counter pills)  3 times a day for 5 days. Take with food.  Take vicodin for breakthrough pain, do not drink alcohol, drive, care for children or do other critical tasks while taking vicodin.  Please follow with your primary care doctor in the next 2 days for a check-up. They must obtain records for further management.   Do not hesitate to return to the Emergency Department for any new, worsening or concerning symptoms.    Arthritis, Nonspecific Arthritis is inflammation of a joint. This usually means pain, redness, warmth or swelling are present. One or more joints may be involved. There are a number of types of arthritis. Your caregiver may not be able to tell what type of arthritis you have right away. CAUSES  The most common cause of arthritis is the wear and tear on the joint (osteoarthritis). This causes damage to the cartilage, which can break down over time. The knees, hips, back and neck are most often affected by this type of arthritis. Other types of arthritis and common causes of joint pain include:  Sprains and other injuries near the joint. Sometimes minor sprains and injuries cause pain and swelling that develop hours later.  Rheumatoid arthritis. This affects hands, feet and knees. It usually affects both sides of your body at the same time. It is often associated with chronic ailments, fever, weight loss and general weakness.  Crystal arthritis. Gout and pseudo gout can cause occasional acute severe pain, redness and swelling in the foot, ankle, or knee.  Infectious arthritis. Bacteria can get into a joint through a break in overlying skin. This can cause infection of the joint. Bacteria and viruses can also spread through the blood and affect your joints.  Drug, infectious and  allergy reactions. Sometimes joints can become mildly painful and slightly swollen with these types of illnesses. SYMPTOMS   Pain is the main symptom.  Your joint or joints can also be red, swollen and warm or hot to the touch.  You may have a fever with certain types of arthritis, or even feel overall ill.  The joint with arthritis will hurt with movement. Stiffness is present with some types of arthritis. DIAGNOSIS  Your caregiver will suspect arthritis based on your description of your symptoms and on your exam. Testing may be needed to find the type of arthritis:  Blood and sometimes urine tests.  X-ray tests and sometimes CT or MRI scans.  Removal of fluid from the joint (arthrocentesis) is done to check for bacteria, crystals or other causes. Your caregiver (or a specialist) will numb the area over the joint with a local anesthetic, and use a needle to remove joint fluid for examination. This procedure is only minimally uncomfortable.  Even with these tests, your caregiver may not be able to tell what kind of arthritis you have. Consultation with a specialist (rheumatologist) may be helpful. TREATMENT  Your caregiver will discuss with you treatment specific to your type of arthritis. If the specific type cannot be determined, then the following general recommendations may apply. Treatment of severe joint pain includes:  Rest.  Elevation.  Anti-inflammatory medication (for example, ibuprofen) may be prescribed. Avoiding activities that cause increased pain.  Only take over-the-counter or prescription medicines for pain and discomfort as recommended by your  caregiver.  Cold packs over an inflamed joint may be used for 10 to 15 minutes every hour. Hot packs sometimes feel better, but do not use overnight. Do not use hot packs if you are diabetic without your caregiver's permission.  A cortisone shot into arthritic joints may help reduce pain and swelling.  Any acute arthritis  that gets worse over the next 1 to 2 days needs to be looked at to be sure there is no joint infection. Long-term arthritis treatment involves modifying activities and lifestyle to reduce joint stress jarring. This can include weight loss. Also, exercise is needed to nourish the joint cartilage and remove waste. This helps keep the muscles around the joint strong. HOME CARE INSTRUCTIONS   Do not take aspirin to relieve pain if gout is suspected. This elevates uric acid levels.  Only take over-the-counter or prescription medicines for pain, discomfort or fever as directed by your caregiver.  Rest the joint as much as possible.  If your joint is swollen, keep it elevated.  Use crutches if the painful joint is in your leg.  Drinking plenty of fluids may help for certain types of arthritis.  Follow your caregiver's dietary instructions.  Try low-impact exercise such as:  Swimming.  Water aerobics.  Biking.  Walking.  Morning stiffness is often relieved by a warm shower.  Put your joints through regular range-of-motion. SEEK MEDICAL CARE IF:   You do not feel better in 24 hours or are getting worse.  You have side effects to medications, or are not getting better with treatment. SEEK IMMEDIATE MEDICAL CARE IF:   You have a fever.  You develop severe joint pain, swelling or redness.  Many joints are involved and become painful and swollen.  There is severe back pain and/or leg weakness.  You have loss of bowel or bladder control. Document Released: 09/25/2004 Document Revised: 11/10/2011 Document Reviewed: 10/11/2008 [facility] Patient Information 2015 [place], [place]. This information is not intended to replace advice given to you by your health care provider. Make sure you discuss any questions you have with your health care provider.

## 2014-08-28 NOTE — ED Provider Notes (Signed)
CSN: [id number]     Arrival date & time 08/28/14  1035 History   First MD Initiated Contact with Patient 08/28/14 1044     Chief Complaint  Patient presents with  . Hand Pain     (Consider location/radiation/quality/duration/timing/severity/associated sxs/prior Treatment) HPI  Shannon [name] is a 58 y.o. female complaining of worsening left hand hand and wrist pain over the course of one month. Patient states she hit the hand lightly, she didn't think twice about it this was approximately one month ago (note that this contradicts triage note) 6 that the pain started about 3 days after the initial mild trauma. It's been worsening since then. Rates it 7 out of 10, is on the radial side of the hand and radiates up the arm. She's been taking naproxen BC powder a home with some relief. Patient is right-hand-dominant. States this started swelling recently. Patient was not evaluated after the initial trauma, no x-rays were taken. She's been using a wrist brace which she has for the right hand at home with some relief. States that this pain is so bad that it stops her from sleep at night. Patient denies weakness, numbness, prior trauma or surgeries to the affected joints.  Past Medical History  Diagnosis Date  . Hypertension   . Sciatica   . Herniated disc    Past Surgical History  Procedure Laterality Date  . Abdominal hysterectomy     History reviewed. No pertinent family history. History  Substance Use Topics  . Smoking status: Current Every Day Smoker -- 1.00 packs/day    Types: Cigarettes  . Smokeless tobacco: Not on file  . Alcohol Use: No   OB History    No data available     Review of Systems  10 systems reviewed and found to be negative, except as noted in the HPI.  Allergies  Propoxyphene n-acetaminophen  Home Medications   Prior to Admission medications   Medication Sig Start Date End Date Taking? Authorizing Provider  acetaminophen (TYLENOL) 325 MG tablet Take 1  tablet (325 mg total) by mouth every 6 (six) hours as needed. 01/01/14   Marissa Sciacca, PA-C  acetaminophen-codeine 120-12 MG/5ML suspension Take 10 mLs by mouth every 6 (six) hours as needed for pain. 04/04/13   Tiffany Marilu Favre, PA-C  amLODipine (NORVASC) 5 MG tablet Take 5 mg by mouth daily.    Historical Provider, MD  clindamycin (CLEOCIN) 150 MG capsule Take 1 capsule (150 mg total) by mouth every 6 (six) hours. 04/04/13   Tiffany Marilu Favre, PA-C  clindamycin (CLEOCIN) 150 MG capsule Take 3 capsules (450 mg total) by mouth 4 (four) times daily. 01/01/14   Marissa Sciacca, PA-C  cloNIDine (CATAPRES) 0.1 MG tablet Take 0.1 mg by mouth 2 (two) times daily.    Historical Provider, MD  diazepam (VALIUM) 5 MG tablet Take 5 mg by mouth every 6 (six) hours as needed for anxiety.    Historical Provider, MD  HYDROcodone-acetaminophen (NORCO/VICODIN) 5-325 MG per tablet Take 1-2 tablets by mouth every 6 hours as needed for pain. 08/28/14   Nickolaos Brallier, PA-C  lisinopril-hydrochlorothiazide (PRINZIDE,ZESTORETIC) 20-25 MG per tablet Take 1 tablet by mouth daily.    Historical Provider, MD  Multiple Vitamin (MULTIVITAMIN WITH MINERALS) TABS Take 1 tablet by mouth daily.    Historical Provider, MD  ondansetron (ZOFRAN) 4 MG tablet Take 1 tablet (4 mg total) by mouth every 6 (six) hours. 01/01/14   Marissa Sciacca, PA-C  potassium chloride SA (K-DUR,KLOR-CON) 20 MEQ  tablet Take 1 tablet (20 mEq total) by mouth 2 (two) times daily. 04/04/13   Tiffany Marilu Favre, PA-C  sertraline (ZOLOFT) 100 MG tablet Take 100 mg by mouth daily.    Historical Provider, MD   BP 135/81 mmHg  Pulse 85  Temp(Src) 98.7 F (37.1 C) (Oral)  Resp 20  SpO2 97% Physical Exam  Constitutional: She is oriented to person, place, and time. She appears well-developed and well-nourished. No distress.  HENT:  Head: Normocephalic.  Eyes: Conjunctivae and EOM are normal.  Cardiovascular: Normal rate.   Pulmonary/Chest: Effort normal. No stridor.    Musculoskeletal: Normal range of motion. She exhibits edema and tenderness.       Arms: NVI,   Mild swelling to dorsum of left wrist   + Snuffbox TTP  Neurological: She is alert and oriented to person, place, and time.  Psychiatric: She has a normal mood and affect.  Nursing note and vitals reviewed.   ED Course  Procedures (including critical care time) Labs Review Labs Reviewed - No data to display  Imaging Review Dg Wrist Complete Left  08/28/2014   CLINICAL DATA:  Left hand and wrist pain and swelling. Pain worse through the thumb and radial aspect of the wrist.  EXAM: LEFT WRIST - COMPLETE 3+ VIEW  COMPARISON:  None.  FINDINGS: No fracture or dislocation. The alignment and joint spaces are maintained. No erosion or periosteal reaction pre no soft tissue calcifications or focal soft tissue abnormality.  IMPRESSION: Unremarkable radiographs of the left wrist.   Electronically Signed   By: Jeb Levering M.D.   On: 08/28/2014 11:19   Dg Hand Complete Left  08/28/2014   CLINICAL DATA:  Left hand and wrist pain and swelling. Pain worse in the thumb and radial aspect of the wrist.  EXAM: LEFT HAND - COMPLETE 3+ VIEW  COMPARISON:  None.  FINDINGS: No fracture or dislocation. The alignment and joint spaces are maintained. Bone mineralization is normal. No erosion or periosteal reaction. There are tiny osteophytes in the thumb interphalangeal joint and distal interphalangeal joints of the digits. No abnormal soft tissue calcifications or focal soft tissue abnormality.  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 No acute bony abnormality or findings of inflammatory arthropathy.   Electronically Signed   By: Jeb Levering M.D.   On: 08/28/2014 11:18     EKG Interpretation None      MDM   Final diagnoses:  Arthritis of left hand    Filed Vitals:   08/28/14 1049  BP: 135/81  Pulse: 85  Temp: 98.7 F (37.1 C)  TempSrc:  Oral  Resp: 20  SpO2: 97%   Shannon [name] is a pleasant 58 y.o. female presenting with severe left hand and wrist pain worsening over the course of the month. Patient is right-hand-dominant. Physical exam with swelling and diffusely tender to palpation. X-ray shows an arthritis in the left thumb which is seems to be the focal point of patient's pain. Will put her in a splint and she says this gives her relief. Patient will be encouraged to take NSAIDs and she will be given Vicodin for breakthrough pain she states that this pain is severe it stops her from sleeping at night. I've encouraged her to follow closely with her primary care physician for management of this chronic issue.   Evaluation does not show pathology that would require ongoing emergent intervention or inpatient treatment. Pt is hemodynamically stable and mentating appropriately. Discussed findings and  plan with patient/guardian, who agrees with care plan. All questions answered. Return precautions discussed and outpatient follow up given.   New Prescriptions   HYDROCODONE-ACETAMINOPHEN (NORCO/VICODIN) 5-325 MG PER TABLET    Take 1-2 tablets by mouth every 6 hours as needed for pain.         Monico Blitz, PA-C 08/28/14 [place], MD 08/29/14 [phone]

## 2014-08-28 NOTE — ED Notes (Signed)
Hand has been hurting for "a while".  Pt states pain is getting worse.  Does not recall trauma. Using brace at home with no improvement.

## 2014-12-04 DIAGNOSIS — Z0100 Encounter for examination of eyes and vision without abnormal findings: Secondary | ICD-10-CM | POA: Diagnosis not present

## 2014-12-20 DIAGNOSIS — K219 Gastro-esophageal reflux disease without esophagitis: Secondary | ICD-10-CM | POA: Diagnosis not present

## 2014-12-20 DIAGNOSIS — R6884 Jaw pain: Secondary | ICD-10-CM | POA: Diagnosis not present

## 2015-02-13 ENCOUNTER — Emergency Department (HOSPITAL_COMMUNITY)
Admission: EM | Admit: 2015-02-13 | Discharge: 2015-02-13 | Disposition: A | Discharge status: Home / Self Care | Payer: Commercial Managed Care - HMO | Attending: Emergency Medicine | Admitting: Emergency Medicine

## 2015-02-13 ENCOUNTER — Encounter (HOSPITAL_COMMUNITY): Payer: Self-pay | Admitting: Emergency Medicine

## 2015-02-13 DIAGNOSIS — Z79899 Other long term (current) drug therapy: Secondary | ICD-10-CM | POA: Insufficient documentation

## 2015-02-13 DIAGNOSIS — Z720 Tobacco use: Secondary | ICD-10-CM | POA: Insufficient documentation

## 2015-02-13 DIAGNOSIS — I10 Essential (primary) hypertension: Secondary | ICD-10-CM | POA: Insufficient documentation

## 2015-02-13 DIAGNOSIS — Z791 Long term (current) use of non-steroidal anti-inflammatories (NSAID): Secondary | ICD-10-CM | POA: Diagnosis not present

## 2015-02-13 DIAGNOSIS — M79642 Pain in left hand: Secondary | ICD-10-CM | POA: Diagnosis not present

## 2015-02-13 DIAGNOSIS — M1990 Unspecified osteoarthritis, unspecified site: Secondary | ICD-10-CM | POA: Diagnosis not present

## 2015-02-13 DIAGNOSIS — Z7982 Long term (current) use of aspirin: Secondary | ICD-10-CM | POA: Insufficient documentation

## 2015-02-13 MED ORDER — IBUPROFEN 800 MG PO TABS
800.0000 mg | ORAL_TABLET | Freq: Once | ORAL | Status: AC
Start: 2015-02-13 — End: 2015-02-13
  Administered 2015-02-13: 800 mg via ORAL
  Filled 2015-02-13: qty 1

## 2015-02-13 NOTE — ED Provider Notes (Signed)
CSN: [id number]     Arrival date & time 02/13/15  1217 History  This chart was scribed for non-physician practitioner, Ottie Glazier, PA-C working with Jola Schmidt, MD, by Chester Holstein, ED Scribe. This patient was seen in room WTR5/WTR5 and the patient's care was started at 12:40 PM.     Chief Complaint  Patient presents with  . Hand Pain     The history is provided by the patient. No language interpreter was used.   HPI Comments: Shannon [name] is a 59 y.o. female with PMHx of HTN, sciatica and herniated disc who presents to the Emergency Department complaining of constant left hand pain with first onset in December. Pt states pain began after hitting her hand on dog cage. She states pain is mostly in fingers and wrist and notes sharp pains that intermittently radiate into upper arm. She notes associated swelling and states holding items aggravates the pain. Pt is right handed. Pt was seen in ED on 08/28/2014 and was told she has arthritis in that hand but states she does no think this is so, since it feels different than her h/o arthritis in her other hand.  Pt has been taking Aleve and BC powder for relief. Pt states she does not type on a computer often at work.  Pt denies neck pain, numbness, weakness, and known recent injury.   Past Medical History  Diagnosis Date  . Hypertension   . Sciatica   . Herniated disc    Past Surgical History  Procedure Laterality Date  . Abdominal hysterectomy     History reviewed. No pertinent family history. History  Substance Use Topics  . Smoking status: Current Every Day Smoker -- 1.00 packs/day    Types: Cigarettes  . Smokeless tobacco: Not on file  . Alcohol Use: No   OB History    No data available     Review of Systems  Musculoskeletal: Positive for myalgias, joint swelling and arthralgias.  Neurological: Positive for numbness. Negative for weakness.      Allergies  Propoxyphene n-acetaminophen  Home Medications   Prior  to Admission medications   Medication Sig Start Date End Date Taking? Authorizing Provider  amLODipine (NORVASC) 5 MG tablet Take 5 mg by mouth daily.   Yes Historical Provider, MD  Aspirin-Salicylamide-Caffeine (BC HEADACHE POWDER PO) Take 1 each by mouth daily as needed (pain).   Yes Historical Provider, MD  cloNIDine (CATAPRES) 0.1 MG tablet Take 0.1 mg by mouth 2 (two) times daily.   Yes Historical Provider, MD  diazepam (VALIUM) 5 MG tablet Take 5 mg by mouth every 6 (six) hours as needed for anxiety.   Yes Historical Provider, MD  HYDROcodone-acetaminophen (NORCO/VICODIN) 5-325 MG per tablet Take 1-2 tablets by mouth every 6 hours as needed for pain. Patient taking differently: Take 1-2 tablets by mouth every 6 (six) hours as needed for moderate pain.  08/28/14  Yes Nicole Pisciotta, PA-C  lisinopril-hydrochlorothiazide (PRINZIDE,ZESTORETIC) 20-25 MG per tablet Take 1 tablet by mouth daily.   Yes Historical Provider, MD  Multiple Vitamin (MULTIVITAMIN WITH MINERALS) TABS Take 1 tablet by mouth daily.   Yes Historical Provider, MD  naproxen sodium (ANAPROX) 220 MG tablet Take 440 mg by mouth 2 (two) times daily as needed (pain).   Yes Historical Provider, MD  acetaminophen (TYLENOL) 325 MG tablet Take 1 tablet (325 mg total) by mouth every 6 (six) hours as needed. Patient not taking: Reported on 02/13/2015 01/01/14   Jamse Mead, PA-C  acetaminophen-codeine 120-12  MG/5ML suspension Take 10 mLs by mouth every 6 (six) hours as needed for pain. Patient not taking: Reported on 02/13/2015 04/04/13   Delos Haring, PA-C  clindamycin (CLEOCIN) 150 MG capsule Take 1 capsule (150 mg total) by mouth every 6 (six) hours. Patient not taking: Reported on 02/13/2015 04/04/13   Delos Haring, PA-C  clindamycin (CLEOCIN) 150 MG capsule Take 3 capsules (450 mg total) by mouth 4 (four) times daily. Patient not taking: Reported on 02/13/2015 01/01/14   Marissa Sciacca, PA-C  ondansetron (ZOFRAN) 4 MG tablet Take 1  tablet (4 mg total) by mouth every 6 (six) hours. Patient not taking: Reported on 02/13/2015 01/01/14   Marissa Sciacca, PA-C  potassium chloride SA (K-DUR,KLOR-CON) 20 MEQ tablet Take 1 tablet (20 mEq total) by mouth 2 (two) times daily. Patient not taking: Reported on 02/13/2015 04/04/13   Delos Haring, PA-C   BP 174/82 mmHg  Pulse 82  Temp(Src) 98.2 F (36.8 C) (Oral)  Resp 16  SpO2 98% Physical Exam  Constitutional: She is oriented to person, place, and time. She appears well-developed and well-nourished.  HENT:  Head: Normocephalic.  Eyes: Conjunctivae are normal.  Neck: Normal range of motion. Neck supple.  Cardiovascular: Intact distal pulses.   Good radial pulse  Pulmonary/Chest: Effort normal.  Musculoskeletal: Normal range of motion. She exhibits edema (left wrist and hand).       Left hand: She exhibits tenderness (snuff box). She exhibits normal capillary refill (<2 sec). Normal sensation noted. Normal strength noted.  Neurological: She is alert and oriented to person, place, and time. She has normal strength.  Skin: Skin is warm and dry.  Psychiatric: She has a normal mood and affect. Her behavior is normal.  Nursing note and vitals reviewed.   ED Course  Procedures (including critical care time) DIAGNOSTIC STUDIES: Oxygen Saturation is 97% on room air, normal by my interpretation.    COORDINATION OF CARE: 12:46 PM Discussed treatment plan with patient at beside, the patient agrees with the plan and has no further questions at this time.   Labs Review Labs Reviewed - No data to display  Imaging Review No results found.   EKG Interpretation None      MDM   Final diagnoses:  Hand pain, left  Arthritis   Patient presents for chronic left hand pain. She is been evaluated in the ED before and had an x-ray which showed arthritis along the area of her pain.  She has no new injury to the hand since her initial visit. She has mild swelling of the left hand and  wrist but good radial pulse, and NVI.  On her previous wrist x-ray she does not have a scaphoid fracture. She explained that she did not think she had arthritis in her left hand. I have given her the x-ray results from both her hand and wrist. I also gave her referral to hand surgery, Dr. Grandville Silos. She can take Tylenol or naproxen for pain and agrees with the plan. She has a hand splint as well as a wrist splint at home but states it is too bulky to wear all the time. I personally performed the services described in this documentation, which was scribed in my presence. The recorded information has been reviewed and is accurate.     Ottie Glazier, PA-C 02/13/15 [place], MD 02/16/15 [phone]

## 2015-02-13 NOTE — Discharge Instructions (Signed)
Arthritis, Nonspecific Take ibuprofen or Tylenol for pain. Follow-up with hand orthopedics. Arthritis is inflammation of a joint. This usually means pain, redness, warmth or swelling are present. One or more joints may be involved. There are a number of types of arthritis. Your caregiver may not be able to tell what type of arthritis you have right away. CAUSES  The most common cause of arthritis is the wear and tear on the joint (osteoarthritis). This causes damage to the cartilage, which can break down over time. The knees, hips, back and neck are most often affected by this type of arthritis. Other types of arthritis and common causes of joint pain include:  Sprains and other injuries near the joint. Sometimes minor sprains and injuries cause pain and swelling that develop hours later.  Rheumatoid arthritis. This affects hands, feet and knees. It usually affects both sides of your body at the same time. It is often associated with chronic ailments, fever, weight loss and general weakness.  Crystal arthritis. Gout and pseudo gout can cause occasional acute severe pain, redness and swelling in the foot, ankle, or knee.  Infectious arthritis. Bacteria can get into a joint through a break in overlying skin. This can cause infection of the joint. Bacteria and viruses can also spread through the blood and affect your joints.  Drug, infectious and allergy reactions. Sometimes joints can become mildly painful and slightly swollen with these types of illnesses. SYMPTOMS   Pain is the main symptom.  Your joint or joints can also be red, swollen and warm or hot to the touch.  You may have a fever with certain types of arthritis, or even feel overall ill.  The joint with arthritis will hurt with movement. Stiffness is present with some types of arthritis. DIAGNOSIS  Your caregiver will suspect arthritis based on your description of your symptoms and on your exam. Testing may be needed to find  the type of arthritis:  Blood and sometimes urine tests.  X-ray tests and sometimes CT or MRI scans.  Removal of fluid from the joint (arthrocentesis) is done to check for bacteria, crystals or other causes. Your caregiver (or a specialist) will numb the area over the joint with a local anesthetic, and use a needle to remove joint fluid for examination. This procedure is only minimally uncomfortable.  Even with these tests, your caregiver may not be able to tell what kind of arthritis you have. Consultation with a specialist (rheumatologist) may be helpful. TREATMENT  Your caregiver will discuss with you treatment specific to your type of arthritis. If the specific type cannot be determined, then the following general recommendations may apply. Treatment of severe joint pain includes:  Rest.  Elevation.  Anti-inflammatory medication (for example, ibuprofen) may be prescribed. Avoiding activities that cause increased pain.  Only take over-the-counter or prescription medicines for pain and discomfort as recommended by your caregiver.  Cold packs over an inflamed joint may be used for 10 to 15 minutes every hour. Hot packs sometimes feel better, but do not use overnight. Do not use hot packs if you are diabetic without your caregiver's permission.  A cortisone shot into arthritic joints may help reduce pain and swelling.  Any acute arthritis that gets worse over the next 1 to 2 days needs to be looked at to be sure there is no joint infection. Long-term arthritis treatment involves modifying activities and lifestyle to reduce joint stress jarring. This can include weight loss. Also, exercise is needed to nourish  the joint cartilage and remove waste. This helps keep the muscles around the joint strong. HOME CARE INSTRUCTIONS   Do not take aspirin to relieve pain if gout is suspected. This elevates uric acid levels.  Only take over-the-counter or prescription medicines for pain, discomfort  or fever as directed by your caregiver.  Rest the joint as much as possible.  If your joint is swollen, keep it elevated.  Use crutches if the painful joint is in your leg.  Drinking plenty of fluids may help for certain types of arthritis.  Follow your caregiver's dietary instructions.  Try low-impact exercise such as:  Swimming.  Water aerobics.  Biking.  Walking.  Morning stiffness is often relieved by a warm shower.  Put your joints through regular range-of-motion. SEEK MEDICAL CARE IF:   You do not feel better in 24 hours or are getting worse.  You have side effects to medications, or are not getting better with treatment. SEEK IMMEDIATE MEDICAL CARE IF:   You have a fever.  You develop severe joint pain, swelling or redness.  Many joints are involved and become painful and swollen.  There is severe back pain and/or leg weakness.  You have loss of bowel or bladder control. Document Released: 09/25/2004 Document Revised: 11/10/2011 Document Reviewed: 10/11/2008 [facility] Patient Information 2015 [place], [place]. This information is not intended to replace advice given to you by your health care provider. Make sure you discuss any questions you have with your health care provider.

## 2015-02-13 NOTE — ED Notes (Signed)
Pt states she hurt left hand before and was seen for it and was told it was arthritis. Pt states it is not arthritis. States she has sharp pains and is unable to hold objects in hand.

## 2015-02-15 DIAGNOSIS — M25562 Pain in left knee: Secondary | ICD-10-CM | POA: Diagnosis not present

## 2015-02-15 DIAGNOSIS — M25532 Pain in left wrist: Secondary | ICD-10-CM | POA: Diagnosis not present

## 2015-02-15 DIAGNOSIS — M25561 Pain in right knee: Secondary | ICD-10-CM | POA: Diagnosis not present

## 2015-03-16 DIAGNOSIS — R5383 Other fatigue: Secondary | ICD-10-CM | POA: Diagnosis not present

## 2015-03-16 DIAGNOSIS — E559 Vitamin D deficiency, unspecified: Secondary | ICD-10-CM | POA: Diagnosis not present

## 2015-03-21 DIAGNOSIS — M654 Radial styloid tenosynovitis [de Quervain]: Secondary | ICD-10-CM | POA: Diagnosis not present

## 2015-03-28 DIAGNOSIS — Z720 Tobacco use: Secondary | ICD-10-CM | POA: Diagnosis not present

## 2015-03-29 DIAGNOSIS — M170 Bilateral primary osteoarthritis of knee: Secondary | ICD-10-CM | POA: Diagnosis not present

## 2015-03-29 DIAGNOSIS — M4806 Spinal stenosis, lumbar region: Secondary | ICD-10-CM | POA: Diagnosis not present

## 2015-04-04 DIAGNOSIS — M4806 Spinal stenosis, lumbar region: Secondary | ICD-10-CM | POA: Diagnosis not present

## 2015-04-04 DIAGNOSIS — M25561 Pain in right knee: Secondary | ICD-10-CM | POA: Diagnosis not present

## 2015-04-10 DIAGNOSIS — M1711 Unilateral primary osteoarthritis, right knee: Secondary | ICD-10-CM | POA: Diagnosis not present

## 2015-04-10 DIAGNOSIS — M25561 Pain in right knee: Secondary | ICD-10-CM | POA: Diagnosis not present

## 2015-04-10 DIAGNOSIS — M4806 Spinal stenosis, lumbar region: Secondary | ICD-10-CM | POA: Diagnosis not present

## 2015-04-10 DIAGNOSIS — M1712 Unilateral primary osteoarthritis, left knee: Secondary | ICD-10-CM | POA: Diagnosis not present

## 2015-04-10 DIAGNOSIS — M545 Low back pain: Secondary | ICD-10-CM | POA: Diagnosis not present

## 2015-04-19 DIAGNOSIS — M25561 Pain in right knee: Secondary | ICD-10-CM | POA: Diagnosis not present

## 2015-04-19 DIAGNOSIS — M1711 Unilateral primary osteoarthritis, right knee: Secondary | ICD-10-CM | POA: Diagnosis not present

## 2015-04-19 DIAGNOSIS — M4806 Spinal stenosis, lumbar region: Secondary | ICD-10-CM | POA: Diagnosis not present

## 2015-04-25 DIAGNOSIS — M25561 Pain in right knee: Secondary | ICD-10-CM | POA: Diagnosis not present

## 2015-04-25 DIAGNOSIS — M4806 Spinal stenosis, lumbar region: Secondary | ICD-10-CM | POA: Diagnosis not present

## 2015-05-02 DIAGNOSIS — M25561 Pain in right knee: Secondary | ICD-10-CM | POA: Diagnosis not present

## 2015-05-02 DIAGNOSIS — M4806 Spinal stenosis, lumbar region: Secondary | ICD-10-CM | POA: Diagnosis not present

## 2015-05-08 DIAGNOSIS — M1711 Unilateral primary osteoarthritis, right knee: Secondary | ICD-10-CM | POA: Diagnosis not present

## 2015-05-08 DIAGNOSIS — M1712 Unilateral primary osteoarthritis, left knee: Secondary | ICD-10-CM | POA: Diagnosis not present

## 2015-05-09 DIAGNOSIS — R140 Abdominal distension (gaseous): Secondary | ICD-10-CM | POA: Diagnosis not present

## 2015-05-09 DIAGNOSIS — M4806 Spinal stenosis, lumbar region: Secondary | ICD-10-CM | POA: Diagnosis not present

## 2015-05-09 DIAGNOSIS — M25561 Pain in right knee: Secondary | ICD-10-CM | POA: Diagnosis not present

## 2015-05-10 ENCOUNTER — Other Ambulatory Visit: Payer: Self-pay | Admitting: Family Medicine

## 2015-05-10 ENCOUNTER — Ambulatory Visit
Admission: RE | Admit: 2015-05-10 | Discharge: 2015-05-10 | Disposition: A | Discharge status: Home / Self Care | Payer: Commercial Managed Care - HMO | Source: Ambulatory Visit | Attending: Family Medicine | Admitting: Family Medicine

## 2015-05-10 ENCOUNTER — Other Ambulatory Visit (HOSPITAL_COMMUNITY): Payer: Self-pay | Admitting: Family Medicine

## 2015-05-10 DIAGNOSIS — Z1231 Encounter for screening mammogram for malignant neoplasm of breast: Secondary | ICD-10-CM

## 2015-05-10 DIAGNOSIS — R1084 Generalized abdominal pain: Secondary | ICD-10-CM

## 2015-05-10 DIAGNOSIS — R109 Unspecified abdominal pain: Secondary | ICD-10-CM | POA: Diagnosis not present

## 2015-05-16 ENCOUNTER — Ambulatory Visit (HOSPITAL_COMMUNITY)
Admission: RE | Admit: 2015-05-16 | Discharge: 2015-05-16 | Disposition: A | Discharge status: Home / Self Care | Payer: Commercial Managed Care - HMO | Source: Ambulatory Visit | Attending: Family Medicine | Admitting: Family Medicine

## 2015-05-16 DIAGNOSIS — M25561 Pain in right knee: Secondary | ICD-10-CM | POA: Diagnosis not present

## 2015-05-16 DIAGNOSIS — Z1231 Encounter for screening mammogram for malignant neoplasm of breast: Secondary | ICD-10-CM | POA: Diagnosis not present

## 2015-05-16 DIAGNOSIS — M4806 Spinal stenosis, lumbar region: Secondary | ICD-10-CM | POA: Diagnosis not present

## 2015-05-17 DIAGNOSIS — M170 Bilateral primary osteoarthritis of knee: Secondary | ICD-10-CM | POA: Diagnosis not present

## 2015-05-23 DIAGNOSIS — M25561 Pain in right knee: Secondary | ICD-10-CM | POA: Diagnosis not present

## 2015-05-23 DIAGNOSIS — M4806 Spinal stenosis, lumbar region: Secondary | ICD-10-CM | POA: Diagnosis not present

## 2015-05-24 DIAGNOSIS — R0602 Shortness of breath: Secondary | ICD-10-CM | POA: Diagnosis not present

## 2015-05-24 DIAGNOSIS — M170 Bilateral primary osteoarthritis of knee: Secondary | ICD-10-CM | POA: Diagnosis not present

## 2015-05-29 ENCOUNTER — Ambulatory Visit (INDEPENDENT_AMBULATORY_CARE_PROVIDER_SITE_OTHER): Payer: Commercial Managed Care - HMO | Admitting: Internal Medicine

## 2015-05-29 ENCOUNTER — Other Ambulatory Visit (INDEPENDENT_AMBULATORY_CARE_PROVIDER_SITE_OTHER): Payer: Commercial Managed Care - HMO

## 2015-05-29 ENCOUNTER — Encounter: Payer: Self-pay | Admitting: Internal Medicine

## 2015-05-29 VITALS — BP 166/106 | HR 82 | Ht 66.0 in | Wt 246.0 lb

## 2015-05-29 DIAGNOSIS — E669 Obesity, unspecified: Secondary | ICD-10-CM | POA: Diagnosis not present

## 2015-05-29 DIAGNOSIS — R0600 Dyspnea, unspecified: Secondary | ICD-10-CM | POA: Diagnosis not present

## 2015-05-29 DIAGNOSIS — I10 Essential (primary) hypertension: Secondary | ICD-10-CM | POA: Diagnosis not present

## 2015-05-29 DIAGNOSIS — D869 Sarcoidosis, unspecified: Secondary | ICD-10-CM | POA: Diagnosis not present

## 2015-05-29 LAB — BRAIN NATRIURETIC PEPTIDE: Pro B Natriuretic peptide (BNP): 31 pg/mL (ref 0.0–100.0)

## 2015-05-29 LAB — TSH: TSH: 1.33 u[IU]/mL (ref 0.35–4.50)

## 2015-05-29 MED ORDER — NEBIVOLOL HCL 5 MG PO TABS: 5.0000 mg | ORAL_TABLET | Freq: Every day | ORAL | Status: DC

## 2015-05-29 NOTE — Patient Instructions (Addendum)
bystolic 5 mg one daily  Please remember to go to the lab  department downstairs for your tests - we will call you with the results when they are available.  Pantoprazole 40 mg  Take 30-60 min before first meal of the day and Pepcid ac (famotidine) 20 mg one @  bedtime until cough is completely gone for at least a week without the need for cough suppression    GERD (REFLUX)  is an extremely common cause of respiratory symptoms just like yours , many times with no obvious heartburn at all.    It can be treated with medication, but also with lifestyle changes including elevation of the head of your bed (ideally with 6 inch  bed blocks),  Smoking cessation, avoidance of late meals, excessive alcohol, and avoid fatty foods, chocolate, peppermint, colas, red wine, and acidic juices such as orange juice.  NO MINT OR MENTHOL PRODUCTS SO NO COUGH DROPS  USE SUGARLESS CANDY INSTEAD (Jolley ranchers or Stover's or Life Savers) or even ice chips will also do - the key is to swallow to prevent all throat clearing. NO OIL BASED VITAMINS - use powdered substitutes.    Please schedule a follow up office visit in 4 weeks, sooner if needed to see Tammy Np for blood pressure check and set up additional studies if needed

## 2015-05-29 NOTE — Progress Notes (Signed)
Subjective:    Patient ID: Shannon [name], female    DOB: 05/16/1956,   MRN: [id number]  HPI   61 yobf quit smoking 04/02/2015 dx with sarcoid at [facility]  around 2000 with sob and knots in neck > rx prednisone just a week or so then had side effects and never took any more  but breathing got  better until sping 2016  Then indolent onset progressive short of breath with exerting or talking assoc with wt gain and worse late Aug 2016 so referred by referred by [facility] to pulmonary clinic 05/29/2015    05/29/2015 1st [facility] office visit/ [facility]   Chief Complaint  Patient presents with  . Pulmonary Consult    Referred by Dr. Kathyrn Lass. Pt c/o increased DOE for the past month.   does fine sitting perfectly still, has to prop up at least 30 degrees at hs, and doe with anyting more than very minimal adls assoc with sense of pnds but min excess mucus.   No obvious other patterns in day to day or daytime variabilty or assoc chronic cough or cp or chest tightness, subjective wheeze overt sinus or hb symptoms. No unusual exp hx or h/o childhood pna/ asthma or knowledge of premature birth.  Sleeping ok at hob >30 degress without nocturnal  or early am exacerbation  of respiratory  c/o's or need for noct saba. Also denies any obvious fluctuation of symptoms with weather or environmental changes or other aggravating or alleviating factors except as outlined above   Current Medications, Allergies, Complete Past Medical History, Past Surgical History, Family History, and Social History were reviewed in [employer] record.              Review of Systems  Constitutional: Positive for chills. Negative for fever and unexpected weight change.  HENT: Positive for dental problem, postnasal drip and sinus pressure. Negative for congestion, ear pain, nosebleeds, rhinorrhea, sneezing, sore throat, trouble swallowing and voice change.   Eyes: Positive for visual disturbance.    Respiratory: Positive for shortness of breath. Negative for cough and choking.   Cardiovascular: Positive for leg swelling. Negative for chest pain.  Gastrointestinal: Negative for vomiting, abdominal pain and diarrhea.  Genitourinary: Negative for difficulty urinating.  Musculoskeletal: Positive for arthralgias.  Skin: Negative for rash.  Neurological: Negative for tremors, syncope and headaches.  Hematological: Does not bruise/bleed easily.       Objective:   Physical Exam  amb obese bf gets very sob with talking   Wt Readings from Last 3 Encounters:  05/29/15 246 lb (111.585 kg)  05/29/08 201 lb (91.173 kg)  01/25/08 223 lb (101.152 kg)    Vital signs reviewed    HEENT: upper dentures,  turbinates, and orophanx. Nl external ear canals without cough reflex   NECK :  without JVD/Nodes/TM/ nl carotid upstrokes bilaterally   LUNGS: no acc muscle use, clear to A and P bilaterally without cough on insp or exp maneuvers   CV:  RRR  no s3 or murmur or increase in P2, no edema   ABD:  soft and nontender with nl excursion in the supine position. No bruits or organomegaly, bowel sounds nl  MS:  warm without deformities, calf tenderness, cyanosis or clubbing  SKIN: warm and dry without lesions    NEURO:  alert, approp, no deficits     I personally reviewed images and agree with radiology impression as follows:   CXR 05/24/15 slt decreased lung volumes/ vasc congestion  Labs ordered/ reviewed 05/29/2015 :  D dimer   Lab Results  Component Value Date   TSH 1.33 05/29/2015     Lab Results  Component Value Date   PROBNP 31.0 05/29/2015      labs 05/25/15  Esr 33 ACE 90 hct 41.5 hc03 28       Assessment & Plan:

## 2015-05-30 LAB — D-DIMER, QUANTITATIVE: D-Dimer, Quant: 0.36 ug/mL-FEU (ref 0.00–0.48)

## 2015-05-30 NOTE — Assessment & Plan Note (Signed)
She has history of using Ace inhibitors and it's unclear when they were stopped but they're not a good choice in a patient with nonspecific unresolved respiratory complaints.  Strongly prefer in this setting: Bystolic, the most beta -1  selective Beta blocker available in sample form, with bisoprolol the most selective generic choice  on the market.   Try bystolic 5 mg daily and f/u in 2 weeks

## 2015-05-30 NOTE — Assessment & Plan Note (Signed)
There is no evidence at all the support active sarcoidosis here. A good rule of thumb is that if  the patient wasn't prednisone dependent at the time of the diagnosis it is unlikely at this late date that she would become active now or require prednisone. The other is that 95% of patients with sarcoid have a abnormal chest x-ray if they have active disease (and the x-ray typically looks  much worse than the patient, which is clearly not the case here) , and she does not have any evidence at all of active disease on her chest x-ray.

## 2015-05-30 NOTE — Assessment & Plan Note (Addendum)
-   05/29/2015  Walked RA x 1 laps @ 185 ft each stopped due to  Tired/ sob/ tearful slow pace but sats 99%   Symptoms are markedly disproportionate to objective findings and not clear this is a lung problem but pt does appear to have difficult airway management issues. DDX of  difficult airways management all start with A and  include Adherence, Ace Inhibitors, Acid Reflux, Active Sinus Disease, Alpha 1 Antitripsin deficiency, Anxiety masquerading as Airways dz,  ABPA,  allergy(esp in young), Aspiration (esp in elderly), Adverse effects of meds,  Active smokers, A bunch of PE's (a small clot burden can't cause this syndrome unless there is already severe underlying pulm or vascular dz with poor reserve) plus two Bs  = Bronchiectasis and Beta blocker use..and one C= CHF  Adherence is always the initial "prime suspect" and is a multilayered concern that requires a "trust but verify" approach in every patient - starting with knowing how to use medications, especially inhalers, correctly, keeping up with refills and understanding the fundamental difference between maintenance and prns vs those medications only taken for a very short course and then stopped and not refilled.   ? Acid (or non-acid) GERD > always difficult to exclude as up to 75% of pts in some series report no assoc GI/ Heartburn symptoms> rec max (24h)  acid suppression and diet restrictions/ reviewed and instructions given in writing.   ? Anxiety > usually at the bottom of this list of usual suspects but should be much higher on this pt's based on H and P and  Fact that breathing much worse since she stopped smoking which is usually a red flag for me that anxiety may have been partially suppressed by nicotine and now is full-blown issue >defer rx to primary care  ? A bunch of pe's > D dimer nl - while  A nl valute  may miss small peripheral pe, the clot burden with sob is moderately high and the d dimer has a very high neg pred value in this  setting    ? Active smoking > denies x 2 m  ? chf > ruled out with bnp so low     Needs to return for pfts to complete the w/u  Total time = 67m review case with pt/ discussion/ counseling/ giving and going over instructions (see avs)

## 2015-05-30 NOTE — Assessment & Plan Note (Signed)
Body mass index is 39.72 kg/(m^2).  Lab Results  Component Value Date   TSH 1.33 05/29/2015     Contributing to gerd tendency/ doe/reviewed the need and the process to achieve and maintain neg calorie balance > defer f/u primary care including intermittently monitoring thyroid status

## 2015-06-07 DIAGNOSIS — M1712 Unilateral primary osteoarthritis, left knee: Secondary | ICD-10-CM | POA: Diagnosis not present

## 2015-06-07 DIAGNOSIS — M1711 Unilateral primary osteoarthritis, right knee: Secondary | ICD-10-CM | POA: Diagnosis not present

## 2015-06-26 ENCOUNTER — Ambulatory Visit: Payer: Commercial Managed Care - HMO | Admitting: Adult Health

## 2015-07-03 ENCOUNTER — Ambulatory Visit (INDEPENDENT_AMBULATORY_CARE_PROVIDER_SITE_OTHER): Payer: Commercial Managed Care - HMO | Admitting: Adult Health

## 2015-07-03 ENCOUNTER — Encounter: Payer: Self-pay | Admitting: Adult Health

## 2015-07-03 VITALS — BP 138/88 | HR 89 | Temp 98.2°F | Ht 66.0 in | Wt 246.0 lb

## 2015-07-03 DIAGNOSIS — D869 Sarcoidosis, unspecified: Secondary | ICD-10-CM | POA: Diagnosis not present

## 2015-07-03 DIAGNOSIS — M1712 Unilateral primary osteoarthritis, left knee: Secondary | ICD-10-CM | POA: Diagnosis not present

## 2015-07-03 DIAGNOSIS — I10 Essential (primary) hypertension: Secondary | ICD-10-CM

## 2015-07-03 DIAGNOSIS — R0600 Dyspnea, unspecified: Secondary | ICD-10-CM | POA: Diagnosis not present

## 2015-07-03 DIAGNOSIS — M1711 Unilateral primary osteoarthritis, right knee: Secondary | ICD-10-CM | POA: Diagnosis not present

## 2015-07-03 MED ORDER — NEBIVOLOL HCL 5 MG PO TABS: 5.0000 mg | ORAL_TABLET | Freq: Every day | ORAL | Status: DC

## 2015-07-03 NOTE — Assessment & Plan Note (Signed)
Work on weight loss.  Great job on not smoking .  Set up for CT chest  Follow up with Dr. Melvyn Novas  In 2 weeks with PFT .  Please contact office for sooner follow up if symptoms do not improve or worsen or seek emergency care

## 2015-07-03 NOTE — Assessment & Plan Note (Signed)
Will need further evaluation  Check CT chest w/ contrast .  Check PFT   Plan  Great job on not smoking .  Set up for CT chest  Follow up with Dr. Melvyn Novas  In 2 weeks with PFT .  Please contact office for sooner follow up if symptoms do not improve or worsen or seek emergency care

## 2015-07-03 NOTE — Assessment & Plan Note (Signed)
Restart Bystolic 5mg  daily .  Follow up with Primary for blood pressure management in next month.  Work on weight loss.  Great job on not smoking .   Follow up with Dr. Melvyn Novas  In 2 weeks with PFT .  Please contact office for sooner follow up if symptoms do not improve or worsen or seek emergency care

## 2015-07-03 NOTE — Patient Instructions (Addendum)
Restart Bystolic 5mg  daily .  Follow up with Primary for blood pressure management in next month.  Work on weight loss.  Great job on not smoking .  Set up for CT chest  Follow up with Dr. Melvyn Novas  In 2 weeks with PFT .  Please contact office for sooner follow up if symptoms do not improve or worsen or seek emergency care

## 2015-07-03 NOTE — Progress Notes (Signed)
Subjective:    Patient ID: Shannon [name], female    DOB: 1956/06/11,   MRN: [id number]  HPI   66 yobf quit smoking 04/02/2015 dx with sarcoid at [facility]  around 2000 with sob and knots in neck > rx prednisone just a week or so then had side effects and never took any more  but breathing got  better until sping 2016  Then indolent onset progressive short of breath with exerting or talking assoc with wt gain and worse late Aug 2016 so referred by referred by [facility] to pulmonary clinic 05/29/2015    05/29/2015 1st [facility] office visit/ [facility]   Chief Complaint  Patient presents with  . Pulmonary Consult    Referred by Dr. Kathyrn Lass. Pt c/o increased DOE for the past month.   does fine sitting perfectly still, has to prop up at least 30 degrees at hs, and doe with anyting more than very minimal adls assoc with sense of pnds but min excess mucus.  >>add bystolic for elevated b/p , labs   07/03/2015 Follow up : Dyspnea and Sarcoid    Patient returns for a 6 week follow-up.  patient was seen last visit for pulmonary consult for dyspnea.  She has a previous diagnosis of sarcoid diagnosed in 2000.  It appears that this has been in active as she has not been on treatment for many years.  She says that she used prednisone initially. However, had a significant skin reaction is not  Been able to take this.  Lab work last visit showed a normal d-dimer , BNP,  TSH.   Patient's blood pressure was elevated at last visit and patient was started on Bystolic.  She says she did improve, however, ran  Out of samples and needs a prescription.  She has not smoked since last visit.  She was congratulated on smoking cessation.  Patient says that she continues to get short of breath with activity.  His unable to exercise due to her shortness of breath.  Patient has not had any previous PFTs.  Notes from last visit revealed a previous ace level at 90.  patient denies any rash, chest pain, orthopnea PND or increased  leg swelling.   Current Medications, Allergies, Complete Past Medical History, Past Surgical History, Family History, and Social History were reviewed in [employer] record.              Review of Systems   Constitutional:   No  weight loss, night sweats,  Fevers, chills, + fatigue, or  lassitude.  HEENT:   No headaches,  Difficulty swallowing,  Tooth/dental problems, or  Sore throat,                No sneezing, itching, ear ache, nasal congestion, post nasal drip,   CV:  No chest pain,  Orthopnea, PND, swelling in lower extremities, anasarca, dizziness, palpitations, syncope.   GI  No heartburn, indigestion, abdominal pain, nausea, vomiting, diarrhea, change in bowel habits, loss of appetite, bloody stools.   Resp:  .  No chest wall deformity  Skin: no rash or lesions.  GU: no dysuria, change in color of urine, no urgency or frequency.  No flank pain, no hematuria   MS:  No joint pain or swelling.  No decreased range of motion.  No back pain.  Psych:  No change in mood or affect. No depression or anxiety.  No memory loss.  Objective:   Physical Exam  amb obese bf    Vital signs reviewed    HEENT: upper dentures,  turbinates, and orophanx. Nl external ear canals without cough reflex   NECK :  without JVD/Nodes/TM/ nl carotid upstrokes bilaterally   LUNGS: no acc muscle use, clear to A and P bilaterally without cough on insp or exp maneuvers   CV:  RRR  no s3 or murmur or increase in P2, no edema   ABD:  soft and nontender with nl excursion in the supine position. No bruits or organomegaly, bowel sounds nl  MS:  warm without deformities, calf tenderness, cyanosis or clubbing  SKIN: warm and dry without lesions    NEURO:  alert, approp, no deficits       CXR 05/24/15 slt decreased lung volumes/ vasc congestion     Labs ordered/ reviewed 05/29/2015 :  D dimer   Lab Results  Component Value Date   TSH 1.33  05/29/2015     Lab Results  Component Value Date   PROBNP 31.0 05/29/2015      labs 05/25/15  Esr 33 ACE 90 hct 41.5 hc03 28       Assessment & Plan:   

## 2015-07-04 NOTE — Progress Notes (Signed)
Chart and office note reviewed in detail  > agree with a/p as outlined    

## 2015-07-09 ENCOUNTER — Ambulatory Visit (INDEPENDENT_AMBULATORY_CARE_PROVIDER_SITE_OTHER)
Admission: RE | Admit: 2015-07-09 | Discharge: 2015-07-09 | Disposition: A | Discharge status: Home / Self Care | Payer: Commercial Managed Care - HMO | Source: Ambulatory Visit | Attending: Adult Health | Admitting: Adult Health

## 2015-07-09 DIAGNOSIS — Z87891 Personal history of nicotine dependence: Secondary | ICD-10-CM | POA: Diagnosis not present

## 2015-07-09 DIAGNOSIS — J432 Centrilobular emphysema: Secondary | ICD-10-CM | POA: Diagnosis not present

## 2015-07-09 DIAGNOSIS — D869 Sarcoidosis, unspecified: Secondary | ICD-10-CM | POA: Diagnosis not present

## 2015-07-09 MED ORDER — IOHEXOL 300 MG/ML  SOLN
75.0000 mL | Freq: Once | INTRAMUSCULAR | Status: AC | PRN
  Administered 2015-07-09: 75 mL via INTRAVENOUS

## 2015-07-10 NOTE — Progress Notes (Signed)
Quick Note:  Called and spoke with pt. Reviewed results and recs. I verified her PFT on 07/24/15 at 12pm and her ov with MW on 07/24/15 at 1:45. Pt voiced understanding and had no further questions. ______

## 2015-07-12 DIAGNOSIS — M25562 Pain in left knee: Secondary | ICD-10-CM | POA: Diagnosis not present

## 2015-07-17 DIAGNOSIS — M170 Bilateral primary osteoarthritis of knee: Secondary | ICD-10-CM | POA: Diagnosis not present

## 2015-07-24 ENCOUNTER — Encounter: Payer: Self-pay | Admitting: Internal Medicine

## 2015-07-24 ENCOUNTER — Ambulatory Visit (INDEPENDENT_AMBULATORY_CARE_PROVIDER_SITE_OTHER): Payer: Commercial Managed Care - HMO | Admitting: Internal Medicine

## 2015-07-24 ENCOUNTER — Ambulatory Visit (HOSPITAL_COMMUNITY)
Admission: RE | Admit: 2015-07-24 | Discharge: 2015-07-24 | Disposition: A | Discharge status: Home / Self Care | Payer: Commercial Managed Care - HMO | Source: Ambulatory Visit | Attending: Internal Medicine | Admitting: Internal Medicine

## 2015-07-24 VITALS — BP 146/98 | HR 91 | Ht 66.0 in | Wt 248.4 lb

## 2015-07-24 DIAGNOSIS — R0600 Dyspnea, unspecified: Secondary | ICD-10-CM | POA: Diagnosis not present

## 2015-07-24 DIAGNOSIS — F17210 Nicotine dependence, cigarettes, uncomplicated: Secondary | ICD-10-CM | POA: Diagnosis not present

## 2015-07-24 DIAGNOSIS — R0609 Other forms of dyspnea: Secondary | ICD-10-CM | POA: Insufficient documentation

## 2015-07-24 DIAGNOSIS — I10 Essential (primary) hypertension: Secondary | ICD-10-CM | POA: Diagnosis not present

## 2015-07-24 DIAGNOSIS — D869 Sarcoidosis, unspecified: Secondary | ICD-10-CM | POA: Insufficient documentation

## 2015-07-24 DIAGNOSIS — R05 Cough: Secondary | ICD-10-CM | POA: Insufficient documentation

## 2015-07-24 DIAGNOSIS — Z720 Tobacco use: Secondary | ICD-10-CM

## 2015-07-24 LAB — PULMONARY FUNCTION TEST
DL/VA % pred: 82 %
DL/VA: 4.15 ml/min/mmHg/L
DLCO unc % pred: 64 %
DLCO unc: 17.33 ml/min/mmHg
FEF 25-75 PRE: 2.29 L/s
FEF 25-75 Post: 2.69 L/sec
FEF2575-%CHANGE-POST: 17 %
FEF2575-%PRED-PRE: 105 %
FEF2575-%Pred-Post: 124 %
FEV1-%Change-Post: 3 %
FEV1-%Pred-Post: 105 %
FEV1-%Pred-Pre: 102 %
FEV1-Post: 2.36 L
FEV1-Pre: 2.29 L
FEV1FVC-%CHANGE-POST: 1 %
FEV1FVC-%Pred-Pre: 101 %
FEV6-%CHANGE-POST: 0 %
FEV6-%PRED-POST: 104 %
FEV6-%Pred-Pre: 103 %
FEV6-PRE: 2.86 L
FEV6-Post: 2.89 L
FEV6FVC-%CHANGE-POST: 0 %
FEV6FVC-%PRED-PRE: 102 %
FEV6FVC-%Pred-Post: 102 %
FVC-%CHANGE-POST: 1 %
FVC-%PRED-POST: 101 %
FVC-%Pred-Pre: 100 %
FVC-Post: 2.9 L
FVC-Pre: 2.86 L
PRE FEV1/FVC RATIO: 80 %
Post FEV1/FVC ratio: 81 %
Post FEV6/FVC ratio: 99 %
Pre FEV6/FVC Ratio: 100 %

## 2015-07-24 MED ORDER — ALBUTEROL SULFATE (2.5 MG/3ML) 0.083% IN NEBU
2.5000 mg | INHALATION_SOLUTION | Freq: Once | RESPIRATORY_TRACT | Status: AC
  Administered 2015-07-24: 2.5 mg via RESPIRATORY_TRACT

## 2015-07-24 MED ORDER — NEBIVOLOL HCL 5 MG PO TABS: 5.0000 mg | ORAL_TABLET | Freq: Every day | ORAL | Status: DC

## 2015-07-24 NOTE — Patient Instructions (Addendum)
Only use your albuterol as a rescue medication to be used if you can't catch your breath by resting or doing a relaxed purse lip breathing pattern.  - The less you use it, the better it will work when you need it. - Ok to use up to 2 puffs  every 4 hours if you must but call for immediate appointment if use goes up over your usual need - Don't leave home without it !!  (think of it like the spare tire for your car)   Weight control is simply a matter of calorie balance which needs to be tilted in your favor by eating less and exercising more.  To get the most out of exercise, you need to be continuously aware that you are short of breath, but never out of breath, for 30 minutes daily. As you improve, it will actually be easier for you to do the same amount of exercise  in  30 minutes so always push to the level where you are short of breath.  If this does not result in gradual weight reduction then I strongly recommend you see a nutritionist with a food diary x 2 weeks so that we can work out a negative calorie balance which is universally effective in steady weight loss programs.  Think of your calorie balance like you do your bank account where in this case you want the balance to go down so you must take in less calories than you burn up.  It's just that simple:  Hard to do, but easy to understand.  Good luck!   You do not have significant lung damage from smoking or sarcoid, only the effects of your weight on your lungs    If you are satisfied with your treatment plan,  let your doctor know and he/she can either refill your medications or you can return here when your prescription runs out.     If in any way you are not 100% satisfied,  please tell us.  If 100% better, tell your friends!  Pulmonary follow up is as needed

## 2015-07-24 NOTE — Progress Notes (Signed)
Subjective:    Patient ID: Shannon [name], female    DOB: 1956-01-09   MRN: [id number]    Brief patient profile:  59 yobf  Active smoker  dx with sarcoid at [facility]  around 2000 with sob and knots in neck > rx prednisone just a week or so then had side effects and never took any more  but breathing got  better until sping 2016  Then indolent onset progressive short of breath with exerting or talking assoc with wt gain and worse late Aug 2016 so referred by referred by [facility] to pulmonary clinic 05/29/2015    History of Present Illness  05/29/2015 1st [facility] office visit/ [facility]   Chief Complaint  Patient presents with  . Pulmonary Consult    Referred by Dr. Kathyrn Lass. Pt c/o increased DOE for the past month.   does fine sitting perfectly still, has to prop up at least 30 degrees at hs, and doe with anyting more than very minimal adls assoc with sense of pnds but min excess mucus.  >>add bystolic for elevated b/p    07/03/2015 NP Follow up : Dyspnea and Sarcoid    Patient returns for a 6 week follow-up.  patient was seen last visit for pulmonary consult for dyspnea.  She has a previous diagnosis of sarcoid diagnosed in 2000.  It appears that this has been in active as she has not been on treatment for many years.  She says that she used prednisone initially. However, had a significant skin reaction is not  Been able to take this.  Lab work last visit showed a normal d-dimer , BNP,  TSH.   Patient's blood pressure was elevated at last visit and patient was started on Bystolic.  She says she did improve, however, ran  Out of samples and needs a prescription.  Patient says that she continues to get short of breath with activity.  His unable to exercise due to her shortness of breath.  Patient has not had any previous PFTs.  Notes from last visit revealed a previous ace level at 90. rec Restart Bystolic 81m daily .  Follow up with Primary for blood pressure management in next month.    Work on weight loss.   Set up for CT chest  Follow up with Dr. WMelvyn Novas In 2 weeks with PFT .     07/24/2015  f/u ov/[facility] re:  Sob related to obesity   Chief Complaint  Patient presents with  . Follow-up    Pt following for Sarcoidosis with PFT: pt states she is about the same since she was last here. pt c/o SOB, some wheezing and  states sometimes when she takes a deep breath she gets a sharp pain in her chest.    still smoking   No obvious day to day or daytime variability or assoc chronic cough or   chest tightness,   or overt sinus or hb symptoms. No unusual exp hx or h/o childhood pna/ asthma or knowledge of premature birth.  Sleeping ok without nocturnal  or early am exacerbation  of respiratory  c/o's or need for noct saba. Also denies any obvious fluctuation of symptoms with weather or environmental changes or other aggravating or alleviating factors except as outlined above   Current Medications, Allergies, Complete Past Medical History, Past Surgical History, Family History, and Social History were reviewed [facility] [employer].  ROS  The following are not active complaints unless bolded sore throat,  dysphagia, dental problems, itching, sneezing,  nasal congestion or excess/ purulent secretions, ear ache,   fever, chills, sweats, unintended wt loss, classically pleuritic or exertional cp, hemoptysis,  orthopnea pnd or leg swelling, presyncope, palpitations, abdominal pain, anorexia, nausea, vomiting, diarrhea  or change in bowel or bladder habits, change in stools or urine, dysuria,hematuria,  rash, arthralgias, visual complaints, headache, numbness, weakness or ataxia or problems with walking or coordination,  change in mood/affect or memory.                         Objective:   Physical Exam  amb obese bf nad    Wt Readings from Last 3 Encounters:  07/24/15 248 lb 6.4 oz (112.674 kg)  07/03/15 246 lb (111.585 kg)  05/29/15 246 lb (111.585 kg)     Vital signs reviewed    HEENT: upper dentures,  turbinates, and orophanx. Nl external ear canals without cough reflex   NECK :  without JVD/Nodes/TM/ nl carotid upstrokes bilaterally   LUNGS: no acc muscle use, clear to A and P bilaterally without cough on insp or exp maneuvers   CV:  RRR  no s3 or murmur or increase in P2, no edema   ABD:  soft and nontender with nl excursion in the supine position. No bruits or organomegaly, bowel sounds nl  MS:  warm without deformities, calf tenderness, cyanosis or clubbing  SKIN: warm and dry without lesions    NEURO:  alert, approp, no deficits       I personally reviewed images and agree with radiology impression as follows:  CT Chest with contrast  07/09/15 1. No evidence of pulmonary parenchymal or nodal sarcoidosis. 2. Mild centrilobular and paraseptal emphysema. No other explanation for patient's symptoms.          labs 05/25/15  Esr 33 ACE 90 hct 41.5 hc03 28       Assessment & Plan:   Outpatient Encounter Prescriptions as of 07/24/2015  Medication Sig  . albuterol (VENTOLIN HFA) 108 (90 BASE) MCG/ACT inhaler Inhale 2 puffs into the lungs every 6 (six) hours as needed for wheezing or shortness of breath.  . CHANTIX STARTING MONTH PAK 0.5 MG X 11 & 1 MG X 42 tablet Take 1 tablet by mouth 2 (two) times daily.  Marland Kitchen DEXILANT 60 MG capsule Take 1 tablet by mouth daily as needed.  Marland Kitchen levofloxacin (LEVAQUIN) 750 MG tablet Take 750 mg by mouth daily.  . meloxicam (MOBIC) 15 MG tablet Take 1 tablet by mouth daily.  . naproxen sodium (ANAPROX) 220 MG tablet Take 440 mg by mouth 2 (two) times daily as needed (pain).  . nebivolol (BYSTOLIC) 5 MG tablet Take 1 tablet (5 mg total) by mouth daily.  . pantoprazole (PROTONIX) 40 MG tablet Take 1 tablet by mouth daily.  . potassium chloride SA (K-DUR,KLOR-CON) 20 MEQ tablet Take 20 mEq by mouth daily.  Marland Kitchen rOPINIRole (REQUIP) 0.5 MG tablet Take 1 tablet by mouth at bedtime.  .  [DISCONTINUED] nebivolol (BYSTOLIC) 5 MG tablet Take 1 tablet (5 mg total) by mouth daily.  . ondansetron (ZOFRAN) 4 MG tablet Take 1 tablet (4 mg total) by mouth every 6 (six) hours. (Patient not taking: Reported on 07/03/2015)   No facility-administered encounter medications on file as of 07/24/2015.

## 2015-07-24 NOTE — Assessment & Plan Note (Signed)
Body mass index is 40.11 kg/(m^2).  Lab Results  Component Value Date   TSH 1.33 05/29/2015     Contributing to gerd tendency/ doe/reviewed the need and the process to achieve and maintain neg calorie balance > defer f/u primary care including intermittently monitoring thyroid status

## 2015-07-26 ENCOUNTER — Encounter: Payer: Self-pay | Admitting: Internal Medicine

## 2015-07-26 DIAGNOSIS — F17210 Nicotine dependence, cigarettes, uncomplicated: Secondary | ICD-10-CM | POA: Insufficient documentation

## 2015-07-26 NOTE — Assessment & Plan Note (Signed)
Adequate control on present rx, reviewed > no change in rx needed  > Follow up per Primary Care planned

## 2015-07-26 NOTE — Assessment & Plan Note (Signed)

## 2015-07-26 NOTE — Assessment & Plan Note (Addendum)
-   05/29/2015  Walked RA x 1 laps @ 185 ft each stopped due to Tired/ sob/ tearful slow pace but sats 99% - CT chest 07/09/15 ok x min emphysema  - 07/24/2015   wnl except for erv 52%     I had an extended summary final  discussion with the patient reviewing all relevant studies completed to date and  lasting 15 to 20 minutes of a 25 minute visit   1) her wt is her main problem, not ild from sarcoid and not copd, at least not so far but note still smoking (see sep a/p)     2) Each maintenance medication was reviewed in detail including most importantly the difference between maintenance and prns and under what circumstances the prns are to be triggered using an action plan format that is not reflected in the computer generated alphabetically organized AVS.    Please see instructions for details which were reviewed in writing and the patient given a copy highlighting the part that I personally wrote and discussed at today's ov.

## 2015-09-19 DIAGNOSIS — Z23 Encounter for immunization: Secondary | ICD-10-CM | POA: Diagnosis not present

## 2015-09-19 DIAGNOSIS — Z13220 Encounter for screening for lipoid disorders: Secondary | ICD-10-CM | POA: Diagnosis not present

## 2015-09-19 DIAGNOSIS — Z0000 Encounter for general adult medical examination without abnormal findings: Secondary | ICD-10-CM | POA: Diagnosis not present

## 2015-09-19 DIAGNOSIS — E559 Vitamin D deficiency, unspecified: Secondary | ICD-10-CM | POA: Diagnosis not present

## 2015-09-19 DIAGNOSIS — I10 Essential (primary) hypertension: Secondary | ICD-10-CM | POA: Diagnosis not present

## 2015-09-19 DIAGNOSIS — G2581 Restless legs syndrome: Secondary | ICD-10-CM | POA: Diagnosis not present

## 2015-10-10 DIAGNOSIS — R197 Diarrhea, unspecified: Secondary | ICD-10-CM | POA: Diagnosis not present

## 2015-10-10 DIAGNOSIS — G8929 Other chronic pain: Secondary | ICD-10-CM | POA: Diagnosis not present

## 2015-10-10 DIAGNOSIS — I10 Essential (primary) hypertension: Secondary | ICD-10-CM | POA: Diagnosis not present

## 2015-10-10 DIAGNOSIS — F322 Major depressive disorder, single episode, severe without psychotic features: Secondary | ICD-10-CM | POA: Diagnosis not present

## 2015-10-30 ENCOUNTER — Other Ambulatory Visit: Payer: Self-pay | Admitting: Gastroenterology

## 2015-10-30 DIAGNOSIS — R197 Diarrhea, unspecified: Secondary | ICD-10-CM | POA: Diagnosis not present

## 2015-10-30 DIAGNOSIS — R1084 Generalized abdominal pain: Secondary | ICD-10-CM | POA: Diagnosis not present

## 2015-10-30 DIAGNOSIS — R109 Unspecified abdominal pain: Secondary | ICD-10-CM

## 2015-10-31 DIAGNOSIS — I10 Essential (primary) hypertension: Secondary | ICD-10-CM | POA: Diagnosis not present

## 2015-10-31 DIAGNOSIS — F322 Major depressive disorder, single episode, severe without psychotic features: Secondary | ICD-10-CM | POA: Diagnosis not present

## 2015-11-02 ENCOUNTER — Ambulatory Visit
Admission: RE | Admit: 2015-11-02 | Discharge: 2015-11-02 | Disposition: A | Discharge status: Home / Self Care | Payer: Commercial Managed Care - HMO | Source: Ambulatory Visit | Attending: Gastroenterology | Admitting: Gastroenterology

## 2015-11-02 DIAGNOSIS — R1013 Epigastric pain: Secondary | ICD-10-CM | POA: Diagnosis not present

## 2015-11-02 DIAGNOSIS — R109 Unspecified abdominal pain: Secondary | ICD-10-CM

## 2015-11-28 DIAGNOSIS — M2550 Pain in unspecified joint: Secondary | ICD-10-CM | POA: Diagnosis not present

## 2015-11-28 DIAGNOSIS — G894 Chronic pain syndrome: Secondary | ICD-10-CM | POA: Diagnosis not present

## 2015-11-28 DIAGNOSIS — M797 Fibromyalgia: Secondary | ICD-10-CM | POA: Diagnosis not present

## 2015-12-03 DIAGNOSIS — R1084 Generalized abdominal pain: Secondary | ICD-10-CM | POA: Diagnosis not present

## 2015-12-03 DIAGNOSIS — R110 Nausea: Secondary | ICD-10-CM | POA: Diagnosis not present

## 2015-12-03 DIAGNOSIS — R197 Diarrhea, unspecified: Secondary | ICD-10-CM | POA: Diagnosis not present

## 2015-12-04 DIAGNOSIS — I10 Essential (primary) hypertension: Secondary | ICD-10-CM | POA: Diagnosis not present

## 2015-12-04 DIAGNOSIS — F322 Major depressive disorder, single episode, severe without psychotic features: Secondary | ICD-10-CM | POA: Diagnosis not present

## 2015-12-04 DIAGNOSIS — R3129 Other microscopic hematuria: Secondary | ICD-10-CM | POA: Diagnosis not present

## 2015-12-04 DIAGNOSIS — M797 Fibromyalgia: Secondary | ICD-10-CM | POA: Diagnosis not present

## 2015-12-04 DIAGNOSIS — G8929 Other chronic pain: Secondary | ICD-10-CM | POA: Diagnosis not present

## 2015-12-10 ENCOUNTER — Other Ambulatory Visit: Payer: Self-pay | Admitting: Gastroenterology

## 2015-12-10 DIAGNOSIS — K2970 Gastritis, unspecified, without bleeding: Secondary | ICD-10-CM | POA: Diagnosis not present

## 2015-12-10 DIAGNOSIS — R1013 Epigastric pain: Secondary | ICD-10-CM | POA: Diagnosis not present

## 2015-12-10 DIAGNOSIS — K2950 Unspecified chronic gastritis without bleeding: Secondary | ICD-10-CM | POA: Diagnosis not present

## 2016-01-16 DIAGNOSIS — M797 Fibromyalgia: Secondary | ICD-10-CM | POA: Diagnosis not present

## 2016-01-16 DIAGNOSIS — F322 Major depressive disorder, single episode, severe without psychotic features: Secondary | ICD-10-CM | POA: Diagnosis not present

## 2016-01-17 ENCOUNTER — Other Ambulatory Visit (HOSPITAL_COMMUNITY): Payer: Self-pay | Admitting: Gastroenterology

## 2016-01-17 DIAGNOSIS — R110 Nausea: Secondary | ICD-10-CM | POA: Diagnosis not present

## 2016-01-17 DIAGNOSIS — R6881 Early satiety: Secondary | ICD-10-CM

## 2016-01-17 DIAGNOSIS — R1084 Generalized abdominal pain: Secondary | ICD-10-CM | POA: Diagnosis not present

## 2016-02-04 ENCOUNTER — Ambulatory Visit (HOSPITAL_COMMUNITY)
Admission: RE | Admit: 2016-02-04 | Discharge: 2016-02-04 | Disposition: A | Discharge status: Home / Self Care | Payer: Commercial Managed Care - HMO | Source: Ambulatory Visit | Attending: Gastroenterology | Admitting: Gastroenterology

## 2016-02-04 DIAGNOSIS — R6881 Early satiety: Secondary | ICD-10-CM | POA: Diagnosis not present

## 2016-02-04 DIAGNOSIS — R140 Abdominal distension (gaseous): Secondary | ICD-10-CM | POA: Diagnosis not present

## 2016-02-04 DIAGNOSIS — R1084 Generalized abdominal pain: Secondary | ICD-10-CM | POA: Insufficient documentation

## 2016-02-04 DIAGNOSIS — R110 Nausea: Secondary | ICD-10-CM

## 2016-02-04 MED ORDER — TECHNETIUM TC 99M SULFUR COLLOID: 2.1000 | Freq: Once | INTRAVENOUS | Status: DC | PRN

## 2016-02-15 DIAGNOSIS — R3121 Asymptomatic microscopic hematuria: Secondary | ICD-10-CM | POA: Diagnosis not present

## 2016-02-21 DIAGNOSIS — R3129 Other microscopic hematuria: Secondary | ICD-10-CM | POA: Diagnosis not present

## 2016-02-21 DIAGNOSIS — R3121 Asymptomatic microscopic hematuria: Secondary | ICD-10-CM | POA: Diagnosis not present

## 2016-02-25 DIAGNOSIS — R3121 Asymptomatic microscopic hematuria: Secondary | ICD-10-CM | POA: Diagnosis not present

## 2016-02-26 DIAGNOSIS — R198 Other specified symptoms and signs involving the digestive system and abdomen: Secondary | ICD-10-CM | POA: Diagnosis not present

## 2016-02-26 DIAGNOSIS — R1084 Generalized abdominal pain: Secondary | ICD-10-CM | POA: Diagnosis not present

## 2016-03-19 DIAGNOSIS — G479 Sleep disorder, unspecified: Secondary | ICD-10-CM | POA: Diagnosis not present

## 2016-03-19 DIAGNOSIS — G2581 Restless legs syndrome: Secondary | ICD-10-CM | POA: Diagnosis not present

## 2016-03-19 DIAGNOSIS — R5382 Chronic fatigue, unspecified: Secondary | ICD-10-CM | POA: Diagnosis not present

## 2016-03-19 DIAGNOSIS — F322 Major depressive disorder, single episode, severe without psychotic features: Secondary | ICD-10-CM | POA: Diagnosis not present

## 2016-07-03 DIAGNOSIS — R0681 Apnea, not elsewhere classified: Secondary | ICD-10-CM | POA: Diagnosis not present

## 2016-07-03 DIAGNOSIS — F418 Other specified anxiety disorders: Secondary | ICD-10-CM | POA: Diagnosis not present

## 2016-07-03 DIAGNOSIS — G479 Sleep disorder, unspecified: Secondary | ICD-10-CM | POA: Diagnosis not present

## 2016-07-03 DIAGNOSIS — R0683 Snoring: Secondary | ICD-10-CM | POA: Diagnosis not present

## 2016-07-03 DIAGNOSIS — G2581 Restless legs syndrome: Secondary | ICD-10-CM | POA: Diagnosis not present

## 2016-07-03 DIAGNOSIS — I10 Essential (primary) hypertension: Secondary | ICD-10-CM | POA: Diagnosis not present

## 2016-09-22 DIAGNOSIS — Z1159 Encounter for screening for other viral diseases: Secondary | ICD-10-CM | POA: Diagnosis not present

## 2016-09-22 DIAGNOSIS — E559 Vitamin D deficiency, unspecified: Secondary | ICD-10-CM | POA: Diagnosis not present

## 2016-09-22 DIAGNOSIS — I10 Essential (primary) hypertension: Secondary | ICD-10-CM | POA: Diagnosis not present

## 2016-09-22 DIAGNOSIS — G479 Sleep disorder, unspecified: Secondary | ICD-10-CM | POA: Diagnosis not present

## 2016-09-22 DIAGNOSIS — M797 Fibromyalgia: Secondary | ICD-10-CM | POA: Diagnosis not present

## 2016-09-22 DIAGNOSIS — Z0001 Encounter for general adult medical examination with abnormal findings: Secondary | ICD-10-CM | POA: Diagnosis not present

## 2016-09-22 DIAGNOSIS — F322 Major depressive disorder, single episode, severe without psychotic features: Secondary | ICD-10-CM | POA: Diagnosis not present

## 2016-09-22 DIAGNOSIS — G2581 Restless legs syndrome: Secondary | ICD-10-CM | POA: Diagnosis not present

## 2016-09-22 DIAGNOSIS — R1013 Epigastric pain: Secondary | ICD-10-CM | POA: Diagnosis not present

## 2016-11-05 ENCOUNTER — Other Ambulatory Visit: Payer: Self-pay | Admitting: Gastroenterology

## 2016-11-05 DIAGNOSIS — R110 Nausea: Secondary | ICD-10-CM | POA: Diagnosis not present

## 2016-11-05 DIAGNOSIS — R1013 Epigastric pain: Secondary | ICD-10-CM | POA: Diagnosis not present

## 2016-11-05 DIAGNOSIS — R109 Unspecified abdominal pain: Secondary | ICD-10-CM

## 2016-11-05 DIAGNOSIS — R1084 Generalized abdominal pain: Secondary | ICD-10-CM | POA: Diagnosis not present

## 2016-11-05 DIAGNOSIS — R140 Abdominal distension (gaseous): Secondary | ICD-10-CM | POA: Diagnosis not present

## 2016-11-10 ENCOUNTER — Ambulatory Visit
Admission: RE | Admit: 2016-11-10 | Discharge: 2016-11-10 | Disposition: A | Discharge status: Home / Self Care | Payer: Medicare HMO | Source: Ambulatory Visit | Attending: Gastroenterology | Admitting: Gastroenterology

## 2016-11-10 DIAGNOSIS — R1084 Generalized abdominal pain: Secondary | ICD-10-CM | POA: Diagnosis not present

## 2016-11-10 DIAGNOSIS — R109 Unspecified abdominal pain: Secondary | ICD-10-CM

## 2016-11-10 MED ORDER — IOPAMIDOL (ISOVUE-300) INJECTION 61%: 125.0000 mL | Freq: Once | INTRAVENOUS | Status: DC | PRN

## 2016-11-18 ENCOUNTER — Encounter (HOSPITAL_COMMUNITY): Payer: Self-pay

## 2016-11-18 ENCOUNTER — Emergency Department (HOSPITAL_COMMUNITY): Payer: Medicare HMO

## 2016-11-18 ENCOUNTER — Emergency Department (HOSPITAL_COMMUNITY)
Admission: EM | Admit: 2016-11-18 | Discharge: 2016-11-18 | Disposition: A | Discharge status: Home / Self Care | Payer: Medicare HMO | Attending: Emergency Medicine | Admitting: Emergency Medicine

## 2016-11-18 DIAGNOSIS — M792 Neuralgia and neuritis, unspecified: Secondary | ICD-10-CM | POA: Diagnosis not present

## 2016-11-18 DIAGNOSIS — D3500 Benign neoplasm of unspecified adrenal gland: Secondary | ICD-10-CM | POA: Diagnosis not present

## 2016-11-18 DIAGNOSIS — R319 Hematuria, unspecified: Secondary | ICD-10-CM

## 2016-11-18 DIAGNOSIS — Z79899 Other long term (current) drug therapy: Secondary | ICD-10-CM | POA: Insufficient documentation

## 2016-11-18 DIAGNOSIS — R1013 Epigastric pain: Secondary | ICD-10-CM | POA: Diagnosis not present

## 2016-11-18 DIAGNOSIS — R109 Unspecified abdominal pain: Secondary | ICD-10-CM

## 2016-11-18 DIAGNOSIS — R079 Chest pain, unspecified: Secondary | ICD-10-CM | POA: Diagnosis not present

## 2016-11-18 DIAGNOSIS — R1011 Right upper quadrant pain: Secondary | ICD-10-CM | POA: Diagnosis not present

## 2016-11-18 DIAGNOSIS — I10 Essential (primary) hypertension: Secondary | ICD-10-CM | POA: Diagnosis not present

## 2016-11-18 DIAGNOSIS — F17210 Nicotine dependence, cigarettes, uncomplicated: Secondary | ICD-10-CM | POA: Diagnosis not present

## 2016-11-18 LAB — URINALYSIS, ROUTINE W REFLEX MICROSCOPIC
Bilirubin Urine: NEGATIVE
Glucose, UA: NEGATIVE mg/dL
Ketones, ur: NEGATIVE mg/dL
Leukocytes, UA: NEGATIVE
Nitrite: NEGATIVE
Protein, ur: NEGATIVE mg/dL
SPECIFIC GRAVITY, URINE: 1.024 (ref 1.005–1.030)
pH: 5 (ref 5.0–8.0)

## 2016-11-18 LAB — CBC
HCT: 44.1 % (ref 36.0–46.0)
Hemoglobin: 14.8 g/dL (ref 12.0–15.0)
MCH: 28.8 pg (ref 26.0–34.0)
MCHC: 33.6 g/dL (ref 30.0–36.0)
MCV: 86 fL (ref 78.0–100.0)
PLATELETS: 319 10*3/uL (ref 150–400)
RBC: 5.13 MIL/uL — ABNORMAL HIGH (ref 3.87–5.11)
RDW: 13.8 % (ref 11.5–15.5)
WBC: 12.6 10*3/uL — AB (ref 4.0–10.5)

## 2016-11-18 LAB — COMPREHENSIVE METABOLIC PANEL
ALBUMIN: 4.1 g/dL (ref 3.5–5.0)
ALT: 16 U/L (ref 14–54)
ANION GAP: 8 (ref 5–15)
AST: 18 U/L (ref 15–41)
Alkaline Phosphatase: 93 U/L (ref 38–126)
BILIRUBIN TOTAL: 0.4 mg/dL (ref 0.3–1.2)
BUN: 14 mg/dL (ref 6–20)
CO2: 24 mmol/L (ref 22–32)
Calcium: 9.6 mg/dL (ref 8.9–10.3)
Chloride: 110 mmol/L (ref 101–111)
Creatinine, Ser: 0.74 mg/dL (ref 0.44–1.00)
GFR calc non Af Amer: >60 mL/min (ref >60)
GLUCOSE: 129 mg/dL — AB (ref 65–99)
POTASSIUM: 3.3 mmol/L — AB (ref 3.5–5.1)
Sodium: 142 mmol/L (ref 135–145)
TOTAL PROTEIN: 7.9 g/dL (ref 6.5–8.1)

## 2016-11-18 LAB — D-DIMER, QUANTITATIVE: D-Dimer, Quant: <0.27 ug/mL-FEU (ref 0.00–0.50)

## 2016-11-18 LAB — LIPASE, BLOOD: Lipase: 34 U/L (ref 11–51)

## 2016-11-18 MED ORDER — CEPHALEXIN 500 MG PO CAPS
1000.0000 mg | ORAL_CAPSULE | Freq: Once | ORAL | Status: AC
  Administered 2016-11-18: 1000 mg via ORAL
  Filled 2016-11-18: qty 2

## 2016-11-18 MED ORDER — PANTOPRAZOLE SODIUM 40 MG PO TBEC
40.0000 mg | DELAYED_RELEASE_TABLET | Freq: Once | ORAL | Status: AC
  Administered 2016-11-18: 40 mg via ORAL
  Filled 2016-11-18: qty 1

## 2016-11-18 MED ORDER — NAPROXEN 375 MG PO TABS: 375.0000 mg | ORAL_TABLET | Freq: Two times a day (BID) | ORAL | 0 refills | Status: DC

## 2016-11-18 MED ORDER — CEPHALEXIN 500 MG PO CAPS: 1000.0000 mg | ORAL_CAPSULE | Freq: Two times a day (BID) | ORAL | 0 refills | Status: DC

## 2016-11-18 MED ORDER — PANTOPRAZOLE SODIUM 20 MG PO TBEC: 20.0000 mg | DELAYED_RELEASE_TABLET | Freq: Every day | ORAL | 0 refills | Status: AC

## 2016-11-18 MED ORDER — DOCUSATE SODIUM 100 MG PO CAPS: 100.0000 mg | ORAL_CAPSULE | Freq: Two times a day (BID) | ORAL | 0 refills | Status: DC

## 2016-11-18 MED ORDER — KETOROLAC TROMETHAMINE 60 MG/2ML IM SOLN
60.0000 mg | Freq: Once | INTRAMUSCULAR | Status: AC
  Administered 2016-11-18: 60 mg via INTRAMUSCULAR
  Filled 2016-11-18: qty 2

## 2016-11-18 MED ORDER — HYDROCODONE-ACETAMINOPHEN 5-325 MG PO TABS
2.0000 | ORAL_TABLET | Freq: Once | ORAL | Status: AC
  Administered 2016-11-18: 2 via ORAL
  Filled 2016-11-18: qty 2

## 2016-11-18 MED ORDER — HYDROCODONE-ACETAMINOPHEN 5-325 MG PO TABS: 1.0000 | ORAL_TABLET | ORAL | 0 refills | Status: DC | PRN

## 2016-11-18 NOTE — ED Notes (Signed)
PT DISCHARGED. INSTRUCTIONS AND PRESCRIPTIONS GIVEN. AAOX4. PT IN NO APPARENT DISTRESS OR PAIN. THE OPPORTUNITY TO ASK QUESTIONS WAS PROVIDED. 

## 2016-11-18 NOTE — ED Provider Notes (Signed)
[facility] DEPT Provider Note   CSN: [id number] Arrival date & time: 11/18/16  1322     History   Chief Complaint Chief Complaint  Patient presents with  . Abdominal Pain    HPI Shannon [name] is a 61 y.o. female.  HPI Patient has had right sided upper abdominal posterior thoracic pain as been severe for about 3 days. She reports that wraps around from her back to her front. She also gets a full pressure sensation in her epigastrium although that has been a more long-standing and chronic problem. She reports for that problem she has had endoscopies and CT scans. She states that she is diagnosed with something like irritable bowel but that she doesn't actually have that. The flank pain however is new as of the past 3 days. It has both an aching and a burning quality. She reports it even hurts her skin which feels like it has a prickling and burning sensation. It hurts when she takes a deep breath or tries to lie on the right side. No fever, no cough, no pain or burning with urination. Patient denies calf pain or swelling. Past Medical History:  Diagnosis Date  . Herniated disc   . Hypertension   . Sciatica     Patient Active Problem List   Diagnosis Date Noted  . Cigarette smoker 07/26/2015  . Dyspnea 05/29/2015  . BACK PAIN, LUMBAR 05/29/2008  . SARCOIDOSIS 12/21/2007  . FATIGUE, CHRONIC 12/21/2007  . RECTAL BLEEDING 12/14/2007  . NECK PAIN, ACUTE 12/14/2007  . ABDOMINAL PAIN, GENERALIZED 12/14/2007  . DEPRESSION 05/13/2007  . Essential hypertension 05/13/2007  . 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 HEMATURIA 05/13/2007  . ALLERGY 05/13/2007  . Morbid obesity ([place]) 05/07/2007    Past Surgical History:  Procedure Laterality Date  . ABDOMINAL HYSTERECTOMY      OB History    No data available       Home Medications    Prior to Admission medications   Medication Sig Start Date End Date Taking? Authorizing Provider  acetaminophen (TYLENOL) 500 MG tablet Take  1,000 mg by mouth every 6 (six) hours as needed for moderate pain.   Yes Historical Provider, MD  BLACK CURRANT SEED OIL PO Take 10 mLs by mouth daily.   Yes Historical Provider, MD  EVENING PRIMROSE OIL PO Take 1 capsule by mouth daily.   Yes Historical Provider, MD  gabapentin (NEURONTIN) 300 MG capsule Take 300 mg by mouth at bedtime.   Yes Historical Provider, MD  losartan-hydrochlorothiazide (HYZAAR) 100-12.5 MG tablet Take 1 tablet by mouth daily.  09/11/16  Yes Historical Provider, MD  Probiotic Product (PROBIOTIC PO) Take 1 capsule by mouth daily.   Yes Historical Provider, MD  Psyllium (FIBER) 0.52 g CAPS Take 1 capsule by mouth daily.   Yes Historical Provider, MD  rOPINIRole (REQUIP) 0.5 MG tablet Take 1 tablet by mouth at bedtime. 06/03/15  Yes Historical Provider, MD  TURMERIC PO Take 1 capsule by mouth daily.   Yes Historical Provider, MD  Vitamin D, Ergocalciferol, (DRISDOL) 50000 units CAPS capsule Take 50,000 Units by mouth every 7 (seven) days. Take on Wednesday.   Yes Historical Provider, MD  albuterol (VENTOLIN HFA) 108 (90 BASE) MCG/ACT inhaler Inhale 2 puffs into the lungs every 6 (six) hours as needed for wheezing or shortness of breath.    Historical Provider, MD  cephALEXin (KEFLEX) 500 MG capsule Take 2 capsules (1,000 mg total) by mouth 2 (two) times daily. 11/18/16   Charlesetta Shanks,  MD  docusate sodium (COLACE) 100 MG capsule Take 1 capsule (100 mg total) by mouth every 12 (twelve) hours. 11/18/16   Charlesetta Shanks, MD  HYDROcodone-acetaminophen (NORCO/VICODIN) 5-325 MG tablet Take 1-2 tablets by mouth every 4 (four) hours as needed for moderate pain or severe pain. 11/18/16   Charlesetta Shanks, MD  naproxen (NAPROSYN) 375 MG tablet Take 1 tablet (375 mg total) by mouth 2 (two) times daily. 11/18/16   Charlesetta Shanks, MD  naproxen sodium (ANAPROX) 220 MG tablet Take 440 mg by mouth 2 (two) times daily as needed (pain).    Historical Provider, MD  nebivolol (BYSTOLIC) 5 MG tablet Take  1 tablet (5 mg total) by mouth daily. Patient not taking: Reported on 11/18/2016 07/24/15   Tanda Rockers, MD  ondansetron (ZOFRAN) 4 MG tablet Take 1 tablet (4 mg total) by mouth every 6 (six) hours. Patient not taking: Reported on 07/03/2015 01/01/14   Marissa Sciacca, PA-C  pantoprazole (PROTONIX) 20 MG tablet Take 1 tablet (20 mg total) by mouth daily. 11/18/16   Charlesetta Shanks, MD    Family History Family History  Problem Relation Age of Onset  . Hypertension Mother   . Diabetes Father     Social History Social History  Substance Use Topics  . Smoking status: Current Some Day Smoker    Packs/day: 0.50    Years: 46.00    Types: Cigarettes    Last attempt to quit: 04/02/2015  . Smokeless tobacco: Never Used  . Alcohol use No     Allergies   Propoxyphene n-acetaminophen and Prednisone   Review of Systems Review of Systems 10 Systems reviewed and are negative for acute change except as noted in the HPI.   Physical Exam Updated Vital Signs BP (!) 151/93   Pulse 70   Temp 98 F (36.7 C) (Oral)   Resp 16   Ht 5\' 6"  (1.676 m)   Wt 232 lb (105.2 kg)   SpO2 96%   BMI 37.45 kg/m   Physical Exam  Constitutional: She is oriented to person, place, and time.  Patient is lying on her left side. She appears to be in significant pain. She is alert and nontoxic. No respiratory distress.  HENT:  Head: Normocephalic and atraumatic.  Mouth/Throat: Oropharynx is clear and moist.  Eyes: EOM are normal. Pupils are equal, round, and reactive to light.  Cardiovascular: Normal rate, regular rhythm, normal heart sounds and intact distal pulses.   Pulmonary/Chest: Effort normal and breath sounds normal.  Abdominal: Soft. Bowel sounds are normal.  Patient does endorse some discomfort palpation in the epigastrium and the right upper quadrant. Lower abdomen is nontender. Palpation around the thoracic wall at about the area of T 10 through 12 elicits discomfort which a patient describes as a  burning and uncomfortable sensation. Close examination however of the thorax in the abdomen did not show lesions on the skin.  Musculoskeletal: Normal range of motion. She exhibits no edema or tenderness.  Neurological: She is alert and oriented to person, place, and time. No cranial nerve deficit. She exhibits normal muscle tone. Coordination normal.  Skin: Skin is warm and dry. No rash noted.  Psychiatric: She has a normal mood and affect.     ED Treatments / Results  Labs (all labs ordered are listed, but only abnormal results are displayed) Labs Reviewed  COMPREHENSIVE METABOLIC PANEL - Abnormal; Notable for the following:       Result Value   Potassium 3.3 (*)  Glucose, Bld 129 (*)    All other components within normal limits  CBC - Abnormal; Notable for the following:    WBC 12.6 (*)    RBC 5.13 (*)    All other components within normal limits  URINALYSIS, ROUTINE W REFLEX MICROSCOPIC - Abnormal; Notable for the following:    APPearance HAZY (*)    Hgb urine dipstick MODERATE (*)    Bacteria, UA FEW (*)    Squamous Epithelial / LPF 0-5 (*)    All other components within normal limits  URINE CULTURE  LIPASE, BLOOD  D-DIMER, QUANTITATIVE (NOT AT [facility])    EKG  EKG Interpretation  Date/Time:  Tuesday November 18 2016 13:38:38 EDT Ventricular Rate:  104 PR Interval:    QRS Duration: 107 QT Interval:  320 QTC Calculation: 421 R Axis:   77 Text Interpretation:  Sinus tachycardia Biatrial enlargement RSR' in V1 or V2, right VCD or RVH Nonspecific T abnormalities, lateral leads Baseline wander in lead(s) II III aVF agree. increased t wave inversion compared to old. Confirmed by Johnney Killian, MD, Jeannie Done [phone]) on 11/18/2016 4:49:49 PM       Radiology Dg Chest 2 View  Result Date: 11/18/2016 CLINICAL DATA:  Mid chest pain EXAM: CHEST  2 VIEW COMPARISON:  07/09/2015, 05/24/2015 FINDINGS: The heart size and mediastinal contours are within normal limits. Both lungs are clear. The  visualized skeletal structures are unremarkable. IMPRESSION: No active cardiopulmonary disease. Electronically Signed   By: Donavan Foil M.D.   On: 11/18/2016 17:19   Ct Renal Stone Study  Result Date: 11/18/2016 CLINICAL DATA:  Epigastric pain for 3 days. EXAM: CT ABDOMEN AND PELVIS WITHOUT CONTRAST TECHNIQUE: Multidetector CT imaging of the abdomen and pelvis was performed following the standard protocol without IV contrast. COMPARISON:  CT scan 11/10/2016 FINDINGS: Lower chest: The lung bases are clear of acute process. No pleural effusion or pulmonary lesions. The heart is normal in size. No pericardial effusion. The distal esophagus and aorta are unremarkable. Hepatobiliary: No focal hepatic lesions or intrahepatic biliary dilatation. The gallbladder is grossly normal. Could not exclude small layering dependent gallstones. No common bile duct dilatation. Pancreas: Grossly normal without contrast. Spleen: Normal size.  No focal lesions. Adrenals/Urinary Tract: Stable bilateral adrenal gland nodules consistent with benign adenomas. No renal, ureteral or bladder calculi.  No worrisome renal lesions. Stomach/Bowel: The stomach, duodenum, small bowel and colon are grossly normal without oral contrast. No inflammatory changes, mass lesions or obstructive findings. The terminal ileum and appendix are normal. Vascular/Lymphatic: Scattered aortic calcifications but no aneurysm. No mesenteric or retroperitoneal mass or adenopathy. Reproductive: The uterus and ovaries are surgically absent. Other: No pelvic mass or adenopathy. No free pelvic fluid collections. No inguinal mass or adenopathy. No abdominal wall hernia or subcutaneous lesions. Musculoskeletal: No significant bone scratch but no significant bony findings. Advanced degenerative disc disease and facet disease at L4-5 and L5 -S1 IMPRESSION: 1. No acute abdominal/pelvic findings, mass lesions or adenopathy. 2. No renal, ureteral or bladder calculi. 3.  Possible small layering gallstones in the gallbladder. No findings for acute cholecystitis. 4. Bilateral adrenal gland adenomas. Electronically Signed   By: Marijo Sanes M.D.   On: 11/18/2016 20:05    Procedures Procedures (including critical care time)  Medications Ordered in ED Medications  cephALEXin (KEFLEX) capsule 1,000 mg (not administered)  HYDROcodone-acetaminophen (NORCO/VICODIN) 5-325 MG per tablet 2 tablet (2 tablets Oral Given 11/18/16 1744)  pantoprazole (PROTONIX) EC tablet 40 mg (40 mg Oral Given 11/18/16 1749)  ketorolac (TORADOL) injection 60 mg (60 mg Intramuscular Given 11/18/16 1743)     Initial Impression / Assessment and Plan / ED Course  I have reviewed the triage vital signs and the nursing notes.  Pertinent labs & imaging results that were available during my care of the patient were reviewed by me and considered in my medical decision making (see chart for details).      Final Clinical Impressions(s) / ED Diagnoses   Final diagnoses:  Right flank pain  Hematuria, unspecified type  Neurogenic pain  Patient has flank\thoracic and epigastric pain. A certain quality of this is highly suggestive of zoster however there is no evident rash at this time. It has a highly burning and paresthesias quality to it. Patient also however has significant hematuria. She has had hysterectomy and has not noted any genital or vaginal type blood. CT scan was obtained to rule out kidney stone with significant right flank pain and hematuria. No significant findings were identified other than possible sludge in the gallbladder. There is however no cholecystitis and LFTs are normal. Due to the hematuria and flank pain, patient will be treated empirically with Keflex and a urine culture obtained. Patient is aware the need to follow-up and have this retested by her doctor. Also patient has had longer standing epigastric discomfort and bloating and distention. Will have her start protonic  for a month to see if this improves the symptoms. She is given Vicodin and naproxen to use for pleuritic\neuropathic type right flank pain with a plan to follow up with her doctor within the next couple of days.  New Prescriptions New Prescriptions   CEPHALEXIN (KEFLEX) 500 MG CAPSULE    Take 2 capsules (1,000 mg total) by mouth 2 (two) times daily.   DOCUSATE SODIUM (COLACE) 100 MG CAPSULE    Take 1 capsule (100 mg total) by mouth every 12 (twelve) hours.   HYDROCODONE-ACETAMINOPHEN (NORCO/VICODIN) 5-325 MG TABLET    Take 1-2 tablets by mouth every 4 (four) hours as needed for moderate pain or severe pain.   NAPROXEN (NAPROSYN) 375 MG TABLET    Take 1 tablet (375 mg total) by mouth 2 (two) times daily.   PANTOPRAZOLE (PROTONIX) 20 MG TABLET    Take 1 tablet (20 mg total) by mouth daily.     Charlesetta Shanks, MD 11/18/16 [phone]

## 2016-11-18 NOTE — ED Triage Notes (Signed)
Patient c/o epigastric pain that radiates to the right mid back and c/o burning and feels better when she burps x 3 days

## 2016-11-18 NOTE — ED Notes (Signed)
Pt ambulated steadily to restroom

## 2016-11-20 DIAGNOSIS — M792 Neuralgia and neuritis, unspecified: Secondary | ICD-10-CM | POA: Diagnosis not present

## 2016-11-20 LAB — URINE CULTURE

## 2016-12-10 DIAGNOSIS — G894 Chronic pain syndrome: Secondary | ICD-10-CM | POA: Diagnosis not present

## 2016-12-10 DIAGNOSIS — M797 Fibromyalgia: Secondary | ICD-10-CM | POA: Diagnosis not present

## 2016-12-16 ENCOUNTER — Other Ambulatory Visit: Payer: Self-pay

## 2016-12-16 DIAGNOSIS — N644 Mastodynia: Secondary | ICD-10-CM

## 2016-12-19 DIAGNOSIS — N644 Mastodynia: Secondary | ICD-10-CM | POA: Diagnosis not present

## 2016-12-19 DIAGNOSIS — E559 Vitamin D deficiency, unspecified: Secondary | ICD-10-CM | POA: Diagnosis not present

## 2016-12-24 ENCOUNTER — Other Ambulatory Visit: Payer: Self-pay | Admitting: Family Medicine

## 2016-12-24 DIAGNOSIS — N644 Mastodynia: Secondary | ICD-10-CM

## 2016-12-26 DIAGNOSIS — H524 Presbyopia: Secondary | ICD-10-CM | POA: Diagnosis not present

## 2016-12-30 HISTORY — PX: BREAST BIOPSY: SHX20

## 2017-01-01 DIAGNOSIS — H5213 Myopia, bilateral: Secondary | ICD-10-CM | POA: Diagnosis not present

## 2017-01-01 DIAGNOSIS — H52209 Unspecified astigmatism, unspecified eye: Secondary | ICD-10-CM | POA: Diagnosis not present

## 2017-01-01 DIAGNOSIS — H524 Presbyopia: Secondary | ICD-10-CM | POA: Diagnosis not present

## 2017-01-02 ENCOUNTER — Other Ambulatory Visit: Payer: Self-pay | Admitting: Family Medicine

## 2017-01-02 ENCOUNTER — Ambulatory Visit
Admission: RE | Admit: 2017-01-02 | Discharge: 2017-01-02 | Disposition: A | Discharge status: Home / Self Care | Payer: Medicare HMO | Source: Ambulatory Visit | Attending: Family Medicine | Admitting: Family Medicine

## 2017-01-02 DIAGNOSIS — R928 Other abnormal and inconclusive findings on diagnostic imaging of breast: Secondary | ICD-10-CM | POA: Diagnosis not present

## 2017-01-02 DIAGNOSIS — N6310 Unspecified lump in the right breast, unspecified quadrant: Secondary | ICD-10-CM

## 2017-01-02 DIAGNOSIS — N644 Mastodynia: Secondary | ICD-10-CM

## 2017-01-02 DIAGNOSIS — N6489 Other specified disorders of breast: Secondary | ICD-10-CM | POA: Diagnosis not present

## 2017-01-06 ENCOUNTER — Ambulatory Visit
Admission: RE | Admit: 2017-01-06 | Discharge: 2017-01-06 | Disposition: A | Discharge status: Home / Self Care | Payer: Medicare HMO | Source: Ambulatory Visit | Attending: Family Medicine | Admitting: Family Medicine

## 2017-01-06 ENCOUNTER — Other Ambulatory Visit: Payer: Self-pay | Admitting: Family Medicine

## 2017-01-06 DIAGNOSIS — N6310 Unspecified lump in the right breast, unspecified quadrant: Secondary | ICD-10-CM

## 2017-01-06 DIAGNOSIS — N6489 Other specified disorders of breast: Secondary | ICD-10-CM | POA: Diagnosis not present

## 2017-01-06 DIAGNOSIS — N6011 Diffuse cystic mastopathy of right breast: Secondary | ICD-10-CM | POA: Diagnosis not present

## 2017-01-06 DIAGNOSIS — N6313 Unspecified lump in the right breast, lower outer quadrant: Secondary | ICD-10-CM | POA: Diagnosis not present

## 2017-05-26 DIAGNOSIS — M797 Fibromyalgia: Secondary | ICD-10-CM | POA: Diagnosis not present

## 2017-05-27 DIAGNOSIS — M797 Fibromyalgia: Secondary | ICD-10-CM | POA: Diagnosis not present

## 2017-06-07 ENCOUNTER — Emergency Department (HOSPITAL_COMMUNITY)
Admission: EM | Admit: 2017-06-07 | Discharge: 2017-06-07 | Disposition: A | Discharge status: Home / Self Care | Payer: Medicare HMO | Attending: Emergency Medicine | Admitting: Emergency Medicine

## 2017-06-07 ENCOUNTER — Encounter (HOSPITAL_COMMUNITY): Payer: Self-pay | Admitting: Emergency Medicine

## 2017-06-07 DIAGNOSIS — I10 Essential (primary) hypertension: Secondary | ICD-10-CM | POA: Diagnosis not present

## 2017-06-07 DIAGNOSIS — M79602 Pain in left arm: Secondary | ICD-10-CM

## 2017-06-07 DIAGNOSIS — F17210 Nicotine dependence, cigarettes, uncomplicated: Secondary | ICD-10-CM | POA: Insufficient documentation

## 2017-06-07 DIAGNOSIS — Z79899 Other long term (current) drug therapy: Secondary | ICD-10-CM | POA: Insufficient documentation

## 2017-06-07 MED ORDER — KETOROLAC TROMETHAMINE 60 MG/2ML IM SOLN
30.0000 mg | Freq: Once | INTRAMUSCULAR | Status: AC
  Administered 2017-06-07: 30 mg via INTRAMUSCULAR
  Filled 2017-06-07: qty 2

## 2017-06-07 MED ORDER — TRAMADOL HCL 50 MG PO TABS: 50.0000 mg | ORAL_TABLET | Freq: Four times a day (QID) | ORAL | 0 refills | Status: DC | PRN

## 2017-06-07 MED ORDER — METHOCARBAMOL 500 MG PO TABS: 500.0000 mg | ORAL_TABLET | Freq: Every evening | ORAL | 0 refills | Status: DC | PRN

## 2017-06-07 NOTE — Discharge Instructions (Signed)
Take muscle relaxer and pain medicine as needed for pain/spasms Take Ibuprofen or Tylenol as well Use ice or heat on affected area Do not wear sling for entire day - do range of motion exercises to prevent stiffness Follow up with your doctor

## 2017-06-07 NOTE — ED Provider Notes (Signed)
[facility] DEPT Provider Note   CSN: [id number] Arrival date & time: 06/07/17  8127     History   Chief Complaint Chief Complaint  Patient presents with  . Headache  . Shoulder Pain    HPI Shannon [name] is a 61 y.o. female who presents with left shoulder and arm pain. She states that she had an acute onset of pain last night when she was in the shower and lifted her left arm up to clean under her armpit. The pain is mostly under her arm pit and radiates to the left shoulder and down the arm to the hand. She had difficulty sleeping last night due to severe pain. The pain is throbbing, constant, worse with movement. No weakness or numbness but she states it feels like she hit her "funny bone". She reports a headache and nausea which is similar to prior headaches. No neck pain, chest pain, SOB, abdominal pain, vomiting. She also notes she was stung by a bee on the right side several days ago and is wondering if this is related but this area does not hurt.  HPI  Past Medical History:  Diagnosis Date  . Herniated disc   . Hypertension   . Sciatica     Patient Active Problem List   Diagnosis Date Noted  . Cigarette smoker 07/26/2015  . Dyspnea 05/29/2015  . BACK PAIN, LUMBAR 05/29/2008  . SARCOIDOSIS 12/21/2007  . FATIGUE, CHRONIC 12/21/2007  . RECTAL BLEEDING 12/14/2007  . NECK PAIN, ACUTE 12/14/2007  . ABDOMINAL PAIN, GENERALIZED 12/14/2007  . DEPRESSION 05/13/2007  . Essential hypertension 05/13/2007  . 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 HEMATURIA 05/13/2007  . ALLERGY 05/13/2007  . Morbid obesity ([place]) 05/07/2007    Past Surgical History:  Procedure Laterality Date  . ABDOMINAL HYSTERECTOMY      OB History    No data available       Home Medications    Prior to Admission medications   Medication Sig Start Date End Date Taking? Authorizing Provider  acetaminophen (TYLENOL) 500 MG tablet Take 1,000 mg by mouth every 6 (six) hours as needed for moderate  pain.    [provider]  albuterol (VENTOLIN HFA) 108 (90 BASE) MCG/ACT inhaler Inhale 2 puffs into the lungs every 6 (six) hours as needed for wheezing or shortness of breath.    [provider]  BLACK CURRANT SEED OIL PO Take 10 mLs by mouth daily.    [provider]  cephALEXin (KEFLEX) 500 MG capsule Take 2 capsules (1,000 mg total) by mouth 2 (two) times daily. 11/18/16   Charlesetta Shanks, MD  docusate sodium (COLACE) 100 MG capsule Take 1 capsule (100 mg total) by mouth every 12 (twelve) hours. 11/18/16   Charlesetta Shanks, MD  EVENING PRIMROSE OIL PO Take 1 capsule by mouth daily.    [provider]  gabapentin (NEURONTIN) 300 MG capsule Take 300 mg by mouth at bedtime.    [provider]  HYDROcodone-acetaminophen (NORCO/VICODIN) 5-325 MG tablet Take 1-2 tablets by mouth every 4 (four) hours as needed for moderate pain or severe pain. 11/18/16   Charlesetta Shanks, MD  losartan-hydrochlorothiazide (HYZAAR) 100-12.5 MG tablet Take 1 tablet by mouth daily.  09/11/16   [provider]  naproxen (NAPROSYN) 375 MG tablet Take 1 tablet (375 mg total) by mouth 2 (two) times daily. 11/18/16   Charlesetta Shanks, MD  naproxen sodium (ANAPROX) 220 MG tablet Take 440 mg by mouth 2 (two) times daily as needed (pain).  [provider]  nebivolol (BYSTOLIC) 5 MG tablet Take 1 tablet (5 mg total) by mouth daily. Patient not taking: Reported on 11/18/2016 07/24/15   Tanda Rockers, MD  ondansetron (ZOFRAN) 4 MG tablet Take 1 tablet (4 mg total) by mouth every 6 (six) hours. Patient not taking: Reported on 07/03/2015 01/01/14   Sciacca, Mable Fill, PA-C  pantoprazole (PROTONIX) 20 MG tablet Take 1 tablet (20 mg total) by mouth daily. 11/18/16   Charlesetta Shanks, MD  Probiotic Product (PROBIOTIC PO) Take 1 capsule by mouth daily.    [provider]  Psyllium (FIBER) 0.52 g CAPS Take 1 capsule by mouth daily.    [provider]  rOPINIRole (REQUIP)  0.5 MG tablet Take 1 tablet by mouth at bedtime. 06/03/15   [provider]  TURMERIC PO Take 1 capsule by mouth daily.    [provider]  Vitamin D, Ergocalciferol, (DRISDOL) 50000 units CAPS capsule Take 50,000 Units by mouth every 7 (seven) days. Take on Wednesday.    [provider]    Family History Family History  Problem Relation Age of Onset  . Hypertension Mother   . Diabetes Father     Social History Social History  Substance Use Topics  . Smoking status: Current Some Day Smoker    Packs/day: 0.50    Years: 46.00    Types: Cigarettes    Last attempt to quit: 04/02/2015  . Smokeless tobacco: Never Used  . Alcohol use No     Allergies   Propoxyphene n-acetaminophen and Prednisone   Review of Systems Review of Systems  Respiratory: Negative for shortness of breath.   Cardiovascular: Negative for chest pain.  Gastrointestinal: Positive for nausea. Negative for abdominal pain and vomiting.  Musculoskeletal: Positive for arthralgias and myalgias. Negative for gait problem.  Neurological: Positive for headaches. Negative for weakness and numbness.  All other systems reviewed and are negative.    Physical Exam Updated Vital Signs BP (!) 183/102   Pulse 75   Temp 97.9 F (36.6 C) (Oral)   Resp 16   SpO2 100%   Physical Exam  Constitutional: She is oriented to person, place, and time. She appears well-developed and well-nourished. No distress.  HENT:  Head: Normocephalic and atraumatic.  Eyes: Pupils are equal, round, and reactive to light. Conjunctivae are normal. Right eye exhibits no discharge. Left eye exhibits no discharge. No scleral icterus.  Neck: Normal range of motion.  Cardiovascular: Normal rate and regular rhythm.  Exam reveals no gallop and no friction rub.   No murmur heard. Pulmonary/Chest: Effort normal and breath sounds normal. No respiratory distress. She has no wheezes. She has no rales. She exhibits no tenderness.    Abdominal: She exhibits no distension.  Musculoskeletal:  Left upper extremity: No obvious swelling, deformity, or warmth. Tenderness to palpation of left trapezius, left upper chest wall, left axilla, entire left upper arm and forearm. Decreased ROM of shoulder due to pain. 5/5 grip strength. N/V intact.   Neurological: She is alert and oriented to person, place, and time.  Skin: Skin is warm and dry.  Psychiatric: She has a normal mood and affect. Her behavior is normal.  Nursing note and vitals reviewed.    ED Treatments / Results  Labs (all labs ordered are listed, but only abnormal results are displayed) Labs Reviewed - No data to display  EKG  EKG Interpretation  Date/Time:  Sunday June 07 2017 07:12:38 EDT Ventricular Rate:  77 PR Interval:  QRS Duration: 119 QT Interval:  375 QTC Calculation: 425 R Axis:   68 Text Interpretation:  Sinus rhythm Left atrial enlargement LVH with IVCD and secondary repol abnrm Since last tracing rate slower t wave abnormalities decreased since last tracing Confirmed by Dorie Rank [phone]) on 06/07/2017 8:18:01 AM       Radiology No results found.  Procedures Procedures (including critical care time)  Medications Ordered in ED Medications  ketorolac (TORADOL) injection 30 mg (30 mg Intramuscular Given 06/07/17 0759)     Initial Impression / Assessment and Plan / ED Course  I have reviewed the triage vital signs and the nursing notes.  Pertinent labs & imaging results that were available during my care of the patient were reviewed by me and considered in my medical decision making (see chart for details).  61 year old female presents with left arm pain. Likely MSK etiology. Pain is easily reproducible. She is hypertensive likely due to pain. Otherwise vitals are normal. EKG is SR. Will order Toradol IM here and sling. Advised Ibuprofen and will rx small amount of pain medicine and muscle relaxer. F/u with PCP  Final Clinical  Impressions(s) / ED Diagnoses   Final diagnoses:  Left arm pain    New Prescriptions New Prescriptions   No medications on file     Iris Pert 06/07/17 6834    Dorie Rank, MD 06/07/17 [phone]

## 2017-06-07 NOTE — ED Triage Notes (Signed)
Pt reports pain from head down neck to L arm that started after taking her shower last night. Pt was stung by bee on R arm yesterday. Denies CP or SOB

## 2017-09-01 HISTORY — PX: APPENDECTOMY: SHX54

## 2017-09-09 DIAGNOSIS — R040 Epistaxis: Secondary | ICD-10-CM | POA: Diagnosis not present

## 2017-09-28 DIAGNOSIS — M797 Fibromyalgia: Secondary | ICD-10-CM | POA: Diagnosis not present

## 2017-09-28 DIAGNOSIS — G2581 Restless legs syndrome: Secondary | ICD-10-CM | POA: Diagnosis not present

## 2017-09-28 DIAGNOSIS — R7309 Other abnormal glucose: Secondary | ICD-10-CM | POA: Diagnosis not present

## 2017-09-28 DIAGNOSIS — G894 Chronic pain syndrome: Secondary | ICD-10-CM | POA: Diagnosis not present

## 2017-09-28 DIAGNOSIS — I10 Essential (primary) hypertension: Secondary | ICD-10-CM | POA: Diagnosis not present

## 2017-09-28 DIAGNOSIS — F322 Major depressive disorder, single episode, severe without psychotic features: Secondary | ICD-10-CM | POA: Diagnosis not present

## 2017-09-28 DIAGNOSIS — Z0000 Encounter for general adult medical examination without abnormal findings: Secondary | ICD-10-CM | POA: Diagnosis not present

## 2017-09-28 DIAGNOSIS — Z720 Tobacco use: Secondary | ICD-10-CM | POA: Diagnosis not present

## 2017-10-28 ENCOUNTER — Encounter (HOSPITAL_COMMUNITY): Payer: Self-pay | Admitting: *Deleted

## 2017-10-28 ENCOUNTER — Emergency Department (HOSPITAL_COMMUNITY): Payer: Medicare HMO

## 2017-10-28 ENCOUNTER — Emergency Department (HOSPITAL_COMMUNITY): Payer: Medicare HMO | Admitting: Certified Registered Nurse Anesthetist

## 2017-10-28 ENCOUNTER — Observation Stay (HOSPITAL_COMMUNITY)
Admission: EM | Admit: 2017-10-28 | Discharge: 2017-10-30 | Disposition: A | Discharge status: Home / Self Care | Payer: Medicare HMO | Attending: General Surgery | Admitting: General Surgery

## 2017-10-28 ENCOUNTER — Other Ambulatory Visit: Payer: Self-pay

## 2017-10-28 ENCOUNTER — Encounter (HOSPITAL_COMMUNITY)
Admission: EM | Disposition: A | Discharge status: Home / Self Care | Payer: Self-pay | Source: Home / Self Care | Attending: Emergency Medicine

## 2017-10-28 DIAGNOSIS — F17210 Nicotine dependence, cigarettes, uncomplicated: Secondary | ICD-10-CM | POA: Insufficient documentation

## 2017-10-28 DIAGNOSIS — Z79899 Other long term (current) drug therapy: Secondary | ICD-10-CM | POA: Diagnosis not present

## 2017-10-28 DIAGNOSIS — K219 Gastro-esophageal reflux disease without esophagitis: Secondary | ICD-10-CM | POA: Diagnosis not present

## 2017-10-28 DIAGNOSIS — D869 Sarcoidosis, unspecified: Secondary | ICD-10-CM | POA: Insufficient documentation

## 2017-10-28 DIAGNOSIS — R319 Hematuria, unspecified: Secondary | ICD-10-CM

## 2017-10-28 DIAGNOSIS — E669 Obesity, unspecified: Secondary | ICD-10-CM | POA: Insufficient documentation

## 2017-10-28 DIAGNOSIS — Z6837 Body mass index (BMI) 37.0-37.9, adult: Secondary | ICD-10-CM | POA: Diagnosis not present

## 2017-10-28 DIAGNOSIS — I10 Essential (primary) hypertension: Secondary | ICD-10-CM | POA: Diagnosis not present

## 2017-10-28 DIAGNOSIS — N1330 Unspecified hydronephrosis: Secondary | ICD-10-CM | POA: Diagnosis not present

## 2017-10-28 DIAGNOSIS — K382 Diverticulum of appendix: Secondary | ICD-10-CM | POA: Insufficient documentation

## 2017-10-28 DIAGNOSIS — R5382 Chronic fatigue, unspecified: Secondary | ICD-10-CM | POA: Diagnosis not present

## 2017-10-28 DIAGNOSIS — J45909 Unspecified asthma, uncomplicated: Secondary | ICD-10-CM | POA: Diagnosis not present

## 2017-10-28 DIAGNOSIS — Z888 Allergy status to other drugs, medicaments and biological substances status: Secondary | ICD-10-CM | POA: Insufficient documentation

## 2017-10-28 DIAGNOSIS — F329 Major depressive disorder, single episode, unspecified: Secondary | ICD-10-CM | POA: Insufficient documentation

## 2017-10-28 DIAGNOSIS — K37 Unspecified appendicitis: Secondary | ICD-10-CM | POA: Diagnosis present

## 2017-10-28 DIAGNOSIS — K3580 Unspecified acute appendicitis: Principal | ICD-10-CM | POA: Insufficient documentation

## 2017-10-28 DIAGNOSIS — R109 Unspecified abdominal pain: Secondary | ICD-10-CM | POA: Diagnosis not present

## 2017-10-28 DIAGNOSIS — R110 Nausea: Secondary | ICD-10-CM | POA: Diagnosis not present

## 2017-10-28 HISTORY — PX: LAPAROSCOPIC APPENDECTOMY: SHX408

## 2017-10-28 LAB — COMPREHENSIVE METABOLIC PANEL
ALK PHOS: 102 U/L (ref 38–126)
ALT: 11 U/L — AB (ref 14–54)
AST: 17 U/L (ref 15–41)
Albumin: 3.8 g/dL (ref 3.5–5.0)
Anion gap: 10 (ref 5–15)
BUN: 10 mg/dL (ref 6–20)
CALCIUM: 8.9 mg/dL (ref 8.9–10.3)
CO2: 24 mmol/L (ref 22–32)
CREATININE: 0.67 mg/dL (ref 0.44–1.00)
Chloride: 105 mmol/L (ref 101–111)
GFR calc non Af Amer: >60 mL/min (ref >60)
Glucose, Bld: 101 mg/dL — ABNORMAL HIGH (ref 65–99)
Potassium: 3.3 mmol/L — ABNORMAL LOW (ref 3.5–5.1)
Sodium: 139 mmol/L (ref 135–145)
Total Bilirubin: 0.6 mg/dL (ref 0.3–1.2)
Total Protein: 7.5 g/dL (ref 6.5–8.1)

## 2017-10-28 LAB — URINALYSIS, ROUTINE W REFLEX MICROSCOPIC
Bilirubin Urine: NEGATIVE
GLUCOSE, UA: NEGATIVE mg/dL
Ketones, ur: NEGATIVE mg/dL
Leukocytes, UA: NEGATIVE
Nitrite: NEGATIVE
PH: 6 (ref 5.0–8.0)
Protein, ur: NEGATIVE mg/dL
SPECIFIC GRAVITY, URINE: 1.015 (ref 1.005–1.030)

## 2017-10-28 LAB — CBC
HCT: 42.6 % (ref 36.0–46.0)
Hemoglobin: 14.3 g/dL (ref 12.0–15.0)
MCH: 29.5 pg (ref 26.0–34.0)
MCHC: 33.6 g/dL (ref 30.0–36.0)
MCV: 87.8 fL (ref 78.0–100.0)
PLATELETS: 290 10*3/uL (ref 150–400)
RBC: 4.85 MIL/uL (ref 3.87–5.11)
RDW: 13.5 % (ref 11.5–15.5)
WBC: 15.3 10*3/uL — ABNORMAL HIGH (ref 4.0–10.5)

## 2017-10-28 LAB — LIPASE, BLOOD: Lipase: 30 U/L (ref 11–51)

## 2017-10-28 SURGERY — APPENDECTOMY, LAPAROSCOPIC
Anesthesia: General

## 2017-10-28 MED ORDER — ALBUTEROL SULFATE HFA 108 (90 BASE) MCG/ACT IN AERS: 2.0000 | INHALATION_SPRAY | Freq: Four times a day (QID) | RESPIRATORY_TRACT | Status: DC | PRN

## 2017-10-28 MED ORDER — MORPHINE SULFATE (PF) 4 MG/ML IV SOLN
4.0000 mg | Freq: Once | INTRAVENOUS | Status: AC
  Administered 2017-10-28: 4 mg via INTRAVENOUS
  Filled 2017-10-28: qty 1

## 2017-10-28 MED ORDER — SUGAMMADEX SODIUM 200 MG/2ML IV SOLN
INTRAVENOUS | Status: AC
  Filled 2017-10-28: qty 2

## 2017-10-28 MED ORDER — ONDANSETRON HCL 4 MG/2ML IJ SOLN
4.0000 mg | Freq: Four times a day (QID) | INTRAMUSCULAR | Status: DC | PRN
  Administered 2017-10-28 – 2017-10-29 (×2): 4 mg via INTRAVENOUS
  Filled 2017-10-28: qty 2

## 2017-10-28 MED ORDER — LIDOCAINE 2% (20 MG/ML) 5 ML SYRINGE
INTRAMUSCULAR | Status: AC
Start: 2017-10-28 — End: 2017-10-28
  Filled 2017-10-28: qty 5

## 2017-10-28 MED ORDER — MORPHINE SULFATE (PF) 4 MG/ML IV SOLN
2.0000 mg | INTRAVENOUS | Status: DC | PRN
  Administered 2017-10-28 – 2017-10-29 (×2): 4 mg via INTRAVENOUS
  Filled 2017-10-28 (×2): qty 1

## 2017-10-28 MED ORDER — BUPIVACAINE-EPINEPHRINE 0.25% -1:200000 IJ SOLN
INTRAMUSCULAR | Status: AC
  Filled 2017-10-28: qty 1

## 2017-10-28 MED ORDER — SUGAMMADEX SODIUM 200 MG/2ML IV SOLN
INTRAVENOUS | Status: DC | PRN
  Administered 2017-10-28 (×2): 200 mg via INTRAVENOUS

## 2017-10-28 MED ORDER — PSYLLIUM 95 % PO PACK
1.0000 | PACK | Freq: Every day | ORAL | Status: DC
  Administered 2017-10-29 – 2017-10-30 (×2): 1 via ORAL
  Filled 2017-10-28 (×2): qty 1

## 2017-10-28 MED ORDER — DEXAMETHASONE SODIUM PHOSPHATE 4 MG/ML IJ SOLN
INTRAMUSCULAR | Status: DC | PRN
  Administered 2017-10-28: 5 mg via INTRAVENOUS

## 2017-10-28 MED ORDER — ROPINIROLE HCL 0.5 MG PO TABS
0.5000 mg | ORAL_TABLET | Freq: Every day | ORAL | Status: DC
  Administered 2017-10-28 – 2017-10-29 (×2): 0.5 mg via ORAL
  Filled 2017-10-28 (×2): qty 1

## 2017-10-28 MED ORDER — ONDANSETRON HCL 4 MG/2ML IJ SOLN
INTRAMUSCULAR | Status: AC
  Filled 2017-10-28: qty 2

## 2017-10-28 MED ORDER — FENTANYL CITRATE (PF) 100 MCG/2ML IJ SOLN
INTRAMUSCULAR | Status: DC | PRN
  Administered 2017-10-28 (×2): 50 ug via INTRAVENOUS

## 2017-10-28 MED ORDER — ROCURONIUM BROMIDE 10 MG/ML (PF) SYRINGE
PREFILLED_SYRINGE | INTRAVENOUS | Status: AC
  Filled 2017-10-28: qty 5

## 2017-10-28 MED ORDER — SODIUM CHLORIDE 0.9 % IJ SOLN
INTRAMUSCULAR | Status: AC
  Filled 2017-10-28: qty 50

## 2017-10-28 MED ORDER — KETOROLAC TROMETHAMINE 30 MG/ML IJ SOLN
30.0000 mg | Freq: Once | INTRAMUSCULAR | Status: DC | PRN
  Administered 2017-10-28: 30 mg via INTRAVENOUS

## 2017-10-28 MED ORDER — 0.9 % SODIUM CHLORIDE (POUR BTL) OPTIME
TOPICAL | Status: DC | PRN
  Administered 2017-10-28: 1000 mL

## 2017-10-28 MED ORDER — ONDANSETRON 4 MG PO TBDP
4.0000 mg | ORAL_TABLET | Freq: Four times a day (QID) | ORAL | Status: DC | PRN
  Administered 2017-10-30: 4 mg via ORAL
  Filled 2017-10-28: qty 1

## 2017-10-28 MED ORDER — SENNOSIDES-DOCUSATE SODIUM 8.6-50 MG PO TABS
1.0000 | ORAL_TABLET | Freq: Every day | ORAL | Status: DC
  Administered 2017-10-28 – 2017-10-29 (×2): 1 via ORAL
  Filled 2017-10-28 (×2): qty 1

## 2017-10-28 MED ORDER — LACTATED RINGERS IR SOLN
Status: DC | PRN
  Administered 2017-10-28: 1000 mL

## 2017-10-28 MED ORDER — ENOXAPARIN SODIUM 40 MG/0.4ML ~~LOC~~ SOLN
40.0000 mg | SUBCUTANEOUS | Status: DC
  Administered 2017-10-29 – 2017-10-30 (×2): 40 mg via SUBCUTANEOUS
  Filled 2017-10-28 (×2): qty 0.4

## 2017-10-28 MED ORDER — KETOROLAC TROMETHAMINE 30 MG/ML IJ SOLN
INTRAMUSCULAR | Status: AC
  Filled 2017-10-28: qty 1

## 2017-10-28 MED ORDER — PROMETHAZINE HCL 25 MG/ML IJ SOLN: 6.2500 mg | INTRAMUSCULAR | Status: DC | PRN

## 2017-10-28 MED ORDER — PROPOFOL 10 MG/ML IV BOLUS
INTRAVENOUS | Status: DC | PRN
  Administered 2017-10-28: 200 mg via INTRAVENOUS

## 2017-10-28 MED ORDER — LACTATED RINGERS IV SOLN
INTRAVENOUS | Status: DC | PRN
  Administered 2017-10-28: 20:00:00 via INTRAVENOUS

## 2017-10-28 MED ORDER — BUPIVACAINE-EPINEPHRINE (PF) 0.25% -1:200000 IJ SOLN
INTRAMUSCULAR | Status: DC | PRN
  Administered 2017-10-28: 50 mL

## 2017-10-28 MED ORDER — ALBUTEROL SULFATE (2.5 MG/3ML) 0.083% IN NEBU: 2.5000 mg | INHALATION_SOLUTION | Freq: Four times a day (QID) | RESPIRATORY_TRACT | Status: DC | PRN

## 2017-10-28 MED ORDER — HYDROCODONE-ACETAMINOPHEN 5-325 MG PO TABS
1.0000 | ORAL_TABLET | ORAL | Status: DC | PRN
  Administered 2017-10-29: 2 via ORAL
  Administered 2017-10-29: 1 via ORAL
  Administered 2017-10-29 (×2): 2 via ORAL
  Filled 2017-10-28 (×2): qty 2
  Filled 2017-10-28: qty 1
  Filled 2017-10-28: qty 2

## 2017-10-28 MED ORDER — GABAPENTIN 300 MG PO CAPS
300.0000 mg | ORAL_CAPSULE | Freq: Every day | ORAL | Status: DC
  Administered 2017-10-28 – 2017-10-29 (×2): 300 mg via ORAL
  Filled 2017-10-28 (×2): qty 1

## 2017-10-28 MED ORDER — ROCURONIUM BROMIDE 10 MG/ML (PF) SYRINGE
PREFILLED_SYRINGE | INTRAVENOUS | Status: DC | PRN
  Administered 2017-10-28: 30 mg via INTRAVENOUS

## 2017-10-28 MED ORDER — HYDROMORPHONE HCL 1 MG/ML IJ SOLN
INTRAMUSCULAR | Status: AC
  Filled 2017-10-28: qty 1

## 2017-10-28 MED ORDER — LOSARTAN POTASSIUM-HCTZ 100-12.5 MG PO TABS: 1.0000 | ORAL_TABLET | Freq: Every day | ORAL | Status: DC

## 2017-10-28 MED ORDER — DULOXETINE HCL 20 MG PO CPEP
20.0000 mg | ORAL_CAPSULE | Freq: Two times a day (BID) | ORAL | Status: DC
  Administered 2017-10-28 – 2017-10-30 (×4): 20 mg via ORAL
  Filled 2017-10-28 (×4): qty 1

## 2017-10-28 MED ORDER — HYDROMORPHONE HCL 1 MG/ML IJ SOLN
0.2500 mg | INTRAMUSCULAR | Status: DC | PRN
  Administered 2017-10-28: 0.5 mg via INTRAVENOUS

## 2017-10-28 MED ORDER — METHOCARBAMOL 500 MG PO TABS
500.0000 mg | ORAL_TABLET | Freq: Every evening | ORAL | Status: DC | PRN
  Administered 2017-10-28 – 2017-10-29 (×3): 500 mg via ORAL
  Filled 2017-10-28 (×3): qty 1

## 2017-10-28 MED ORDER — BISACODYL 10 MG RE SUPP: 10.0000 mg | Freq: Every day | RECTAL | Status: DC | PRN

## 2017-10-28 MED ORDER — PANTOPRAZOLE SODIUM 20 MG PO TBEC
20.0000 mg | DELAYED_RELEASE_TABLET | Freq: Every day | ORAL | Status: DC
Start: 2017-10-29 — End: 2017-10-30
  Administered 2017-10-29 – 2017-10-30 (×2): 20 mg via ORAL
  Filled 2017-10-28 (×2): qty 1

## 2017-10-28 MED ORDER — ONDANSETRON HCL 4 MG/2ML IJ SOLN
INTRAMUSCULAR | Status: DC | PRN
  Administered 2017-10-28: 4 mg via INTRAVENOUS

## 2017-10-28 MED ORDER — SODIUM CHLORIDE 0.9 % IV BOLUS (SEPSIS)
1000.0000 mL | Freq: Once | INTRAVENOUS | Status: AC
  Administered 2017-10-28: 1000 mL via INTRAVENOUS

## 2017-10-28 MED ORDER — DEXAMETHASONE SODIUM PHOSPHATE 10 MG/ML IJ SOLN
INTRAMUSCULAR | Status: AC
  Filled 2017-10-28: qty 1

## 2017-10-28 MED ORDER — FENTANYL CITRATE (PF) 100 MCG/2ML IJ SOLN
INTRAMUSCULAR | Status: AC
  Filled 2017-10-28: qty 2

## 2017-10-28 MED ORDER — MEPERIDINE HCL 50 MG/ML IJ SOLN: 6.2500 mg | INTRAMUSCULAR | Status: DC | PRN

## 2017-10-28 MED ORDER — LOSARTAN POTASSIUM 50 MG PO TABS
100.0000 mg | ORAL_TABLET | Freq: Every day | ORAL | Status: DC
  Administered 2017-10-29 – 2017-10-30 (×2): 100 mg via ORAL
  Filled 2017-10-28 (×2): qty 2

## 2017-10-28 MED ORDER — SUCCINYLCHOLINE CHLORIDE 200 MG/10ML IV SOSY
PREFILLED_SYRINGE | INTRAVENOUS | Status: DC | PRN
  Administered 2017-10-28: 140 mg via INTRAVENOUS

## 2017-10-28 MED ORDER — IOPAMIDOL (ISOVUE-300) INJECTION 61%
INTRAVENOUS | Status: AC
  Administered 2017-10-28: 100 mL
  Filled 2017-10-28: qty 100

## 2017-10-28 MED ORDER — LIDOCAINE 2% (20 MG/ML) 5 ML SYRINGE
INTRAMUSCULAR | Status: DC | PRN
  Administered 2017-10-28: 80 mg via INTRAVENOUS

## 2017-10-28 MED ORDER — PROPOFOL 10 MG/ML IV BOLUS
INTRAVENOUS | Status: AC
  Filled 2017-10-28: qty 20

## 2017-10-28 MED ORDER — VARENICLINE TARTRATE 1 MG PO TABS
1.0000 mg | ORAL_TABLET | Freq: Two times a day (BID) | ORAL | Status: DC
  Administered 2017-10-28 – 2017-10-30 (×4): 1 mg via ORAL
  Filled 2017-10-28 (×4): qty 1

## 2017-10-28 MED ORDER — KCL IN DEXTROSE-NACL 20-5-0.45 MEQ/L-%-% IV SOLN
INTRAVENOUS | Status: DC
  Administered 2017-10-28 (×2): via INTRAVENOUS
  Filled 2017-10-28: qty 1000

## 2017-10-28 MED ORDER — SUCCINYLCHOLINE CHLORIDE 200 MG/10ML IV SOSY
PREFILLED_SYRINGE | INTRAVENOUS | Status: AC
Start: 2017-10-28 — End: 2017-10-28
  Filled 2017-10-28: qty 10

## 2017-10-28 MED ORDER — HYDROCHLOROTHIAZIDE 12.5 MG PO CAPS
12.5000 mg | ORAL_CAPSULE | Freq: Every day | ORAL | Status: DC
  Administered 2017-10-29 – 2017-10-30 (×2): 12.5 mg via ORAL
  Filled 2017-10-28 (×2): qty 1

## 2017-10-28 MED ORDER — SODIUM CHLORIDE 0.9 % IV SOLN
2.0000 g | INTRAVENOUS | Status: DC
  Administered 2017-10-28: 2 g via INTRAVENOUS
  Filled 2017-10-28: qty 20

## 2017-10-28 MED ORDER — SIMETHICONE 80 MG PO CHEW: 40.0000 mg | CHEWABLE_TABLET | Freq: Four times a day (QID) | ORAL | Status: DC | PRN

## 2017-10-28 MED ORDER — ONDANSETRON HCL 4 MG/2ML IJ SOLN
4.0000 mg | Freq: Once | INTRAMUSCULAR | Status: AC
  Administered 2017-10-28: 4 mg via INTRAVENOUS
  Filled 2017-10-28: qty 2

## 2017-10-28 SURGICAL SUPPLY — 43 items
ADH SKN CLS APL DERMABOND .7 (GAUZE/BANDAGES/DRESSINGS) ×1
APPLIER CLIP 5 13 M/L LIGAMAX5 (MISCELLANEOUS)
APPLIER CLIP ROT 10 11.4 M/L (STAPLE)
APR CLP MED LRG 11.4X10 (STAPLE)
APR CLP MED LRG 5 ANG JAW (MISCELLANEOUS)
BAG SPEC RTRVL LRG 6X4 10 (ENDOMECHANICALS) ×1
CABLE HIGH FREQUENCY MONO STRZ (ELECTRODE) ×2 IMPLANT
CHLORAPREP W/TINT 26ML (MISCELLANEOUS) ×2 IMPLANT
CLIP APPLIE 5 13 M/L LIGAMAX5 (MISCELLANEOUS) IMPLANT
CLIP APPLIE ROT 10 11.4 M/L (STAPLE) IMPLANT
DECANTER SPIKE VIAL GLASS SM (MISCELLANEOUS) ×2 IMPLANT
DERMABOND ADVANCED (GAUZE/BANDAGES/DRESSINGS) ×1
DERMABOND ADVANCED .7 DNX12 (GAUZE/BANDAGES/DRESSINGS) ×1 IMPLANT
DRAPE LAPAROSCOPIC ABDOMINAL (DRAPES) ×1 IMPLANT
ELECT REM PT RETURN 15FT ADLT (MISCELLANEOUS) ×2 IMPLANT
GLOVE BIO SURGEON STRL SZ 6.5 (GLOVE) ×2 IMPLANT
GLOVE BIOGEL PI IND STRL 7.0 (GLOVE) ×1 IMPLANT
GLOVE BIOGEL PI INDICATOR 7.0 (GLOVE) ×1
GOWN STRL REUS W/TWL 2XL LVL3 (GOWN DISPOSABLE) ×2 IMPLANT
GOWN STRL REUS W/TWL XL LVL3 (GOWN DISPOSABLE) ×2 IMPLANT
GRASPER SUT TROCAR 14GX15 (MISCELLANEOUS) ×1 IMPLANT
HANDLE STAPLE EGIA 4 XL (STAPLE) ×1 IMPLANT
IRRIG SUCT STRYKERFLOW 2 WTIP (MISCELLANEOUS) ×2
IRRIGATION SUCT STRKRFLW 2 WTP (MISCELLANEOUS) ×1 IMPLANT
KIT BASIN OR (CUSTOM PROCEDURE TRAY) ×2 IMPLANT
MARKER SKIN DUAL TIP RULER LAB (MISCELLANEOUS) IMPLANT
POUCH SPECIMEN RETRIEVAL 10MM (ENDOMECHANICALS) ×1 IMPLANT
RELOAD EGIA 45 MED/THCK PURPLE (STAPLE) IMPLANT
RELOAD EGIA 45 TAN VASC (STAPLE) ×1 IMPLANT
RELOAD EGIA 60 MED/THCK PURPLE (STAPLE) ×2 IMPLANT
RELOAD EGIA 60 TAN VASC (STAPLE) IMPLANT
RELOAD STAPLE 60 MED/THCK ART (STAPLE) IMPLANT
SCISSORS LAP 5X35 DISP (ENDOMECHANICALS) IMPLANT
SLEEVE XCEL OPT CAN 5 100 (ENDOMECHANICALS) ×1 IMPLANT
SUT VIC AB 2-0 SH 27 (SUTURE)
SUT VIC AB 2-0 SH 27X BRD (SUTURE) IMPLANT
SUT VIC AB 4-0 PS2 27 (SUTURE) ×2 IMPLANT
SUT VICRYL 0 UR6 27IN ABS (SUTURE) IMPLANT
TOWEL OR 17X26 10 PK STRL BLUE (TOWEL DISPOSABLE) ×2 IMPLANT
TRAY FOLEY CATH 14FRSI W/METER (CATHETERS) ×1 IMPLANT
TRAY LAPAROSCOPIC (CUSTOM PROCEDURE TRAY) ×2 IMPLANT
TROCAR BLADELESS OPT 5 100 (ENDOMECHANICALS) ×1 IMPLANT
TROCAR XCEL BLUNT TIP 100MML (ENDOMECHANICALS) ×2 IMPLANT

## 2017-10-28 NOTE — Anesthesia Postprocedure Evaluation (Signed)
Anesthesia Post Note  Patient: Shannon [name]  Procedure(s) Performed: APPENDECTOMY LAPAROSCOPIC (N/A )     Patient location during evaluation: PACU Anesthesia Type: General Level of consciousness: awake and sedated Pain management: pain level controlled Vital Signs Assessment: post-procedure vital signs reviewed and stable Respiratory status: spontaneous breathing Cardiovascular status: stable Postop Assessment: no apparent nausea or vomiting Anesthetic complications: no    Last Vitals:  Vitals:   10/28/17 2137 10/28/17 2145  BP: (!) 165/97 (!) 170/87  Pulse: 74 68  Resp:  15  Temp: 36.4 C   SpO2: 96% 98%    Last Pain:  Vitals:   10/28/17 1853  TempSrc:   PainSc: 10-Worst pain ever   Pain Goal:                 Burnis Kaser JR,JOHN Verbie Babic

## 2017-10-28 NOTE — Anesthesia Preprocedure Evaluation (Signed)
Anesthesia Evaluation  Patient identified by MRN, date of birth, ID band Patient awake    Reviewed: Allergy & Precautions, NPO status , Patient's Chart, lab work & pertinent test results  Airway Mallampati: I       Dental  (+) Lower Dentures, Upper Dentures   Pulmonary asthma , Current Smoker,    Pulmonary exam normal breath sounds clear to auscultation       Cardiovascular hypertension, Pt. on home beta blockers Normal cardiovascular exam Rhythm:Regular Rate:Normal     Neuro/Psych    GI/Hepatic Neg liver ROS, GERD  Medicated,  Endo/Other  negative endocrine ROS  Renal/GU negative Renal ROS  negative genitourinary   Musculoskeletal negative musculoskeletal ROS (+)   Abdominal (+) + obese,   Peds  Hematology negative hematology ROS (+)   Anesthesia Other Findings   Reproductive/Obstetrics                             Anesthesia Physical Anesthesia Plan  ASA: II  Anesthesia Plan: General   Post-op Pain Management:    Induction: Intravenous  PONV Risk Score and Plan: 3 and Ondansetron, Dexamethasone and Midazolam  Airway Management Planned: Oral ETT  Additional Equipment:   Intra-op Plan:   Post-operative Plan: Extubation in OR  Informed Consent: I have reviewed the patients History and Physical, chart, labs and discussed the procedure including the risks, benefits and alternatives for the proposed anesthesia with the patient or authorized representative who has indicated his/her understanding and acceptance.   Dental advisory given  Plan Discussed with: CRNA and Surgeon  Anesthesia Plan Comments:         Anesthesia Quick Evaluation

## 2017-10-28 NOTE — Op Note (Signed)
Shannon [name] [id number]   PRE-OPERATIVE DIAGNOSIS:  acute appendicitis  POST-OPERATIVE DIAGNOSIS:  acute appendicitis   Procedure(s): APPENDECTOMY LAPAROSCOPIC   Surgeon(s): Leighton Ruff, MD  ASSISTANT: none   ANESTHESIA:   local and general  EBL:   64ml  Delay start of Pharmacological VTE agent (>24hrs) due to surgical blood loss or risk of bleeding:  no  DRAINS: none   SPECIMEN:  Source of Specimen:  appendix  DISPOSITION OF SPECIMEN:  PATHOLOGY  COUNTS:  YES  PLAN OF CARE: Admit for overnight observation  PATIENT DISPOSITION:  PACU - hemodynamically stable.   INDICATIONS: Patient with concerning symptoms & work up suspicious for appendicitis.  Surgery was recommended:  The anatomy & physiology of the digestive tract was discussed.  The pathophysiology of appendicitis was discussed.  Natural history risks without surgery was discussed.   I feel the risks of no intervention will lead to serious problems that outweigh the operative risks; therefore, I recommended diagnostic laparoscopy with removal of appendix to remove the pathology.  Laparoscopic & open techniques were discussed.   I noted a good likelihood this will help address the problem.    Risks such as bleeding, infection, abscess, leak, reoperation, possible ostomy, hernia, heart attack, death, and other risks were discussed.  Goals of post-operative recovery were discussed as well.  We will work to minimize complications.  Questions were answered.  The patient expresses understanding & wishes to proceed with surgery.  OR FINDINGS: edematous appendiceal tip with infllamation  DESCRIPTION:   The patient was identified & brought into the operating room. The patient was positioned supine with left arm tucked. SCDs were active during the entire case. The patient underwent general anesthesia without any difficulty.  A foley catheter was inserted under sterile conditions. The abdomen was prepped and draped in a  sterile fashion. A Surgical Timeout confirmed our plan.   I made a transverse incision through the inferior umbilical fold.  I made a nick in the infraumbilical fascia and confirmed peritoneal entry.  I placed a stay suture and then the [facility] port.  We induced carbon dioxide insufflation.  Camera inspection revealed no injury.  I placed additional ports under direct laparoscopic visualization.  I mobilized the terminal ileum to proximal ascending colon in a lateral to medial fashion.  I took care to avoid injuring any retroperitoneal structures.   I freed the appendix off its attachments to the ascending colon and cecal mesentery.  I elevated the appendix.  I was able to free off the base of the appendix, which was still viable.  I stapled the appendix off the cecum using a laparoscopic purple load stapler.  I took a healthy cuff viable cecum. I skeletonized & ligated the mesoappendix with a brown load stapler.   I placed the appendix inside an EndoCatch bag and removed out the [place] port.  I did copious irrigation. Hemostasis was good in the mesoappendix, colon mesentery, and retroperitoneum. Staple line was intact on the cecum with no bleeding. I washed out the pelvis, retrohepatic space and right paracolic gutter.  Hemostasis is good. There was no perforation or injury.  Because the area cleaned up well after irrigation, I did not place a drain.  I aspirated the carbon dioxide. I removed the ports. I closed the umbilical fascia site using a 0 Vicryl stitch. I closed skin using 4-0 vicryl stitch.  Sterile dressings were applied.  Patient was extubated and sent to the recovery room.  I discussed the operative findings with  the patient's family. I suspect the patient is going used in the hospital at least overnight and will need antibiotics for 1 days. Questions answered. They expressed understanding and appreciation.

## 2017-10-28 NOTE — ED Provider Notes (Signed)
[facility] DEPT Provider Note   CSN: [id number] Arrival date & time: 10/28/17  1340     History   Chief Complaint Chief Complaint  Patient presents with  . Abdominal Pain  . Rt flank pain    HPI Shannon [name] is a 62 y.o. female.  HPI   62 year old female with history of obesity, sarcoidosis, depression, GI bleeding presenting complaining of abdominal pain and flank pain.  Patient reports she has history of recurrent abdominal pain for many years.  She normally have trouble between constipation and diarrhea but never was diagnosed with IBS.  For the past week she has increasing abdominal pain.  Pain localized primarily to her right flank.  Started as generalized cramping but now pain is waxing waning and currently rates as 5 out of 10.  She was constipated yesterday but had a normal bowel movement today.  She felt nausea without vomiting.  She mentioned her urine always shows blood but she has never been evaluated for that before.  She denies any prior history of kidney stone.  No complaints of chest pain shortness of breath or productive cough.  No dysuria.  She has not noticed any rash.  She has been using over-the-counter medication at home without adequate relief.  No fever or chills.   Past Medical History:  Diagnosis Date  . Herniated disc   . Hypertension   . Sciatica     Patient Active Problem List   Diagnosis Date Noted  . Cigarette smoker 07/26/2015  . Dyspnea 05/29/2015  . BACK PAIN, LUMBAR 05/29/2008  . SARCOIDOSIS 12/21/2007  . FATIGUE, CHRONIC 12/21/2007  . RECTAL BLEEDING 12/14/2007  . NECK PAIN, ACUTE 12/14/2007  . ABDOMINAL PAIN, GENERALIZED 12/14/2007  . DEPRESSION 05/13/2007  . Essential hypertension 05/13/2007  . 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 HEMATURIA 05/13/2007  . ALLERGY 05/13/2007  . Morbid obesity ([place]) 05/07/2007    Past Surgical History:  Procedure Laterality Date  . ABDOMINAL HYSTERECTOMY      OB  History    No data available       Home Medications    Prior to Admission medications   Medication Sig Start Date End Date Taking? Authorizing Provider  acetaminophen (TYLENOL) 500 MG tablet Take 1,000 mg by mouth every 6 (six) hours as needed for moderate pain.    [provider]  albuterol (VENTOLIN HFA) 108 (90 BASE) MCG/ACT inhaler Inhale 2 puffs into the lungs every 6 (six) hours as needed for wheezing or shortness of breath.    [provider]  BLACK CURRANT SEED OIL PO Take 10 mLs by mouth daily.    [provider]  cephALEXin (KEFLEX) 500 MG capsule Take 2 capsules (1,000 mg total) by mouth 2 (two) times daily. 11/18/16   Charlesetta Shanks, MD  docusate sodium (COLACE) 100 MG capsule Take 1 capsule (100 mg total) by mouth every 12 (twelve) hours. 11/18/16   Charlesetta Shanks, MD  EVENING PRIMROSE OIL PO Take 1 capsule by mouth daily.    [provider]  gabapentin (NEURONTIN) 300 MG capsule Take 300 mg by mouth at bedtime.    [provider]  HYDROcodone-acetaminophen (NORCO/VICODIN) 5-325 MG tablet Take 1-2 tablets by mouth every 4 (four) hours as needed for moderate pain or severe pain. 11/18/16   Charlesetta Shanks, MD  losartan-hydrochlorothiazide (HYZAAR) 100-12.5 MG tablet Take 1 tablet by mouth daily.  09/11/16   [provider]  methocarbamol (ROBAXIN) 500 MG tablet Take 1 tablet (500 mg  total) by mouth at bedtime and may repeat dose one time if needed. 06/07/17   Recardo Evangelist, PA-C  naproxen (NAPROSYN) 375 MG tablet Take 1 tablet (375 mg total) by mouth 2 (two) times daily. 11/18/16   Charlesetta Shanks, MD  naproxen sodium (ANAPROX) 220 MG tablet Take 440 mg by mouth 2 (two) times daily as needed (pain).    [provider]  nebivolol (BYSTOLIC) 5 MG tablet Take 1 tablet (5 mg total) by mouth daily. Patient not taking: Reported on 11/18/2016 07/24/15   Tanda Rockers, MD  ondansetron (ZOFRAN) 4 MG tablet Take 1 tablet (4 mg  total) by mouth every 6 (six) hours. Patient not taking: Reported on 07/03/2015 01/01/14   Sciacca, Mable Fill, PA-C  pantoprazole (PROTONIX) 20 MG tablet Take 1 tablet (20 mg total) by mouth daily. 11/18/16   Charlesetta Shanks, MD  Probiotic Product (PROBIOTIC PO) Take 1 capsule by mouth daily.    [provider]  Psyllium (FIBER) 0.52 g CAPS Take 1 capsule by mouth daily.    [provider]  rOPINIRole (REQUIP) 0.5 MG tablet Take 1 tablet by mouth at bedtime. 06/03/15   [provider]  traMADol (ULTRAM) 50 MG tablet Take 1 tablet (50 mg total) by mouth every 6 (six) hours as needed. 06/07/17   Recardo Evangelist, PA-C  TURMERIC PO Take 1 capsule by mouth daily.    [provider]  Vitamin D, Ergocalciferol, (DRISDOL) 50000 units CAPS capsule Take 50,000 Units by mouth every 7 (seven) days. Take on Wednesday.    [provider]    Family History Family History  Problem Relation Age of Onset  . Hypertension Mother   . Diabetes Father     Social History Social History   Tobacco Use  . Smoking status: Current Some Day Smoker    Packs/day: 0.50    Years: 46.00    Pack years: 23.00    Types: Cigarettes    Last attempt to quit: 04/02/2015    Years since quitting: 2.5  . Smokeless tobacco: Never Used  Substance Use Topics  . Alcohol use: No    Alcohol/week: 0.0 oz  . Drug use: No     Allergies   Propoxyphene n-acetaminophen and Prednisone   Review of Systems Review of Systems  All other systems reviewed and are negative.    Physical Exam Updated Vital Signs BP (!) 159/78   Pulse 94   Temp 98.2 F (36.8 C) (Oral)   Resp 15   Ht 5\' 6"  (1.676 m)   Wt 106.1 kg (234 lb)   SpO2 98%   BMI 37.77 kg/m   Physical Exam  Constitutional: She appears well-developed and well-nourished. No distress.  Obese female nontoxic in appearance  HENT:  Head: Atraumatic.  Eyes: Conjunctivae are normal.  Neck: Neck supple.  Cardiovascular: Normal  rate and regular rhythm.  Pulmonary/Chest: Effort normal and breath sounds normal.  Abdominal: Soft. Normal appearance and bowel sounds are normal. There is generalized tenderness. There is rebound, guarding and CVA tenderness. There is no tenderness at McBurney's point and negative Murphy's sign.  Neurological: She is alert.  Skin: No rash noted.  Psychiatric: She has a normal mood and affect.  Nursing note and vitals reviewed.    ED Treatments / Results  Labs (all labs ordered are listed, but only abnormal results are displayed) Labs Reviewed  COMPREHENSIVE METABOLIC PANEL - Abnormal; Notable for the following components:      Result Value  Potassium 3.3 (*)    Glucose, Bld 101 (*)    ALT 11 (*)    All other components within normal limits  CBC - Abnormal; Notable for the following components:   WBC 15.3 (*)    All other components within normal limits  URINALYSIS, ROUTINE W REFLEX MICROSCOPIC - Abnormal; Notable for the following components:   Hgb urine dipstick MODERATE (*)    Bacteria, UA RARE (*)    Squamous Epithelial / LPF 0-5 (*)    All other components within normal limits  LIPASE, BLOOD    EKG  EKG Interpretation None       Radiology Ct Abdomen Pelvis W Contrast  Result Date: 10/28/2017 CLINICAL DATA:  Mid epigastric pain radiating to the right flank, nausea EXAM: CT ABDOMEN AND PELVIS WITH CONTRAST TECHNIQUE: Multidetector CT imaging of the abdomen and pelvis was performed using the standard protocol following bolus administration of intravenous contrast. CONTRAST:  159mL ISOVUE-300 IOPAMIDOL (ISOVUE-300) INJECTION 61% COMPARISON:  11/18/2016, 11/10/2016, 02/21/2016 CT FINDINGS: Lower chest: Lung bases show no acute consolidation or pleural effusion. The heart size is borderline enlarged. Small right posterior fat containing diaphragmatic hernia Hepatobiliary: No focal liver abnormality is seen. No gallstones, gallbladder wall thickening, or biliary dilatation.  Pancreas: Unremarkable. No pancreatic ductal dilatation or surrounding inflammatory changes. Spleen: Normal in size without focal abnormality. Adrenals/Urinary Tract: Similar appearance of adrenal gland nodules left greater than right. Mild right hydronephrosis and hydroureter, but no visible stones along the course of the ureter. Left kidney within normal limits. The bladder is unremarkable Stomach/Bowel: The stomach is nonenlarged. No dilated small bowel. Retrocecal appendix with ascending configuration and tip visible near the inferior right hepatic lobe. Marked periappendiceal inflammation. Luminal diameter up to 2 cm. Low-attenuation foci within the lumen of the appendix. No calcified stones. Vascular/Lymphatic: Mild aortic atherosclerosis. No aneurysmal dilatation. No significantly enlarged lymph nodes. Reproductive: Status post hysterectomy. No adnexal masses. Other: Negative for free air or free fluid Musculoskeletal: Grade 1 anterolisthesis of L4 on L5. Degenerative changes at L4-L5 and L5-S1. No acute or suspicious lesion IMPRESSION: 1. Findings consistent with acute appendicitis. Given marked luminal diameter up to 2 cm and the presence of low attenuating foci within the lumen of the inflamed appendix, consider possible appendicitis with mucocele. Appendix: Location: Retrocecal with ascending configuration, tip position beneath the inferior tip of the right lobe of liver Diameter: 2 cm Appendicolith: Not seen Mucosal hyper-enhancement: Not seen Extraluminal gas: Not seen Periappendiceal collection: Moderate surrounding inflammation but no discrete focal fluid collection 2. Mild right hydronephrosis and hydroureter, but no stones along the course of the right ureter. Electronically Signed   By: Donavan Foil M.D.   On: 10/28/2017 18:22    Procedures Procedures (including critical care time)  Medications Ordered in ED Medications  sodium chloride 0.9 % injection (not administered)  morphine 4 MG/ML  injection 4 mg (4 mg Intravenous Given 10/28/17 1703)  ondansetron (ZOFRAN) injection 4 mg (4 mg Intravenous Given 10/28/17 1703)  sodium chloride 0.9 % bolus 1,000 mL (1,000 mLs Intravenous New Bag/Given 10/28/17 1703)  iopamidol (ISOVUE-300) 61 % injection (100 mLs  Contrast Given 10/28/17 1742)     Initial Impression / Assessment and Plan / ED Course  I have reviewed the triage vital signs and the nursing notes.  Pertinent labs & imaging results that were available during my care of the patient were reviewed by me and considered in my medical decision making (see chart for details).  BP (!) 142/88 (BP Location: Left Arm)   Pulse 94   Temp 98.2 F (36.8 C) (Oral)   Resp 16   Ht 5\' 6"  (1.676 m)   Wt 106.1 kg (234 lb)   SpO2 99%   BMI 37.77 kg/m    Final Clinical Impressions(s) / ED Diagnoses   Final diagnoses:  Acute appendicitis, unspecified acute appendicitis type  Hematuria, unspecified type    ED Discharge Orders    None     4:25 PM Patient with history of recurrent abdominal pain here with abdominal pain and now having right flank pain.  She is diffusely tender when palpated her abdomen.  She has a right CVA tenderness.  Urine shows evidence of hemoglobin but patient states she always has blood in the urine.  She also has an elevated WBC of 15.3.  Plan to obtain an abdominal pelvic CT scan for further evaluation.  IV fluid and pain medication along with antinausea medication given.  Care discussed with Dr. Lacinda Axon.   7:02 PM Abdominal pelvis CT scan finding is consistent with acute appendicitis without perforation or abscess.  Patient made aware of CT finding and recommend n.p.o.  Last full meal was this morning.  She did admits to taking a few sips of water earlier.  Appreciate consultation from general surgery, Dr. Marcello Moores, who agrees to see patient in the ER and will admit for further management.   Domenic Moras, PA-C 10/28/17 1904    Nat Christen, MD 10/30/17  2045

## 2017-10-28 NOTE — ED Triage Notes (Signed)
Pt is alert and oriented x 4 and is verbally responsive Pt reports having mid/epi gastric pain that radiates to the rt side flanks that began about a week. Pt reports 5/10 burning pain  That worsening with movement with associated Nausea. Pt denies vomiting. or diarrhea.  Pt denies any pain or burning with urination. Pt reports abdominal nbloating

## 2017-10-28 NOTE — Transfer of Care (Signed)
Immediate Anesthesia Transfer of Care Note  Patient: Shannon [name]  Procedure(s) Performed: APPENDECTOMY LAPAROSCOPIC (N/A )  Patient Location: PACU  Anesthesia Type:General  Level of Consciousness: drowsy and patient cooperative  Airway & Oxygen Therapy: Patient Spontanous Breathing and Patient connected to face mask  Post-op Assessment: Report given to RN and Post -op Vital signs reviewed and stable  Post vital signs: Reviewed and stable  Last Vitals:  Vitals:   10/28/17 1416 10/28/17 1829  BP: (!) 159/78 (!) 142/88  Pulse: 94 94  Resp: 15 16  Temp: 36.8 C 36.8 C  SpO2: 98% 99%    Last Pain:  Vitals:   10/28/17 1853  TempSrc:   PainSc: 10-Worst pain ever         Complications: No apparent anesthesia complications

## 2017-10-28 NOTE — Anesthesia Procedure Notes (Signed)
Procedure Name: Intubation Date/Time: 10/28/2017 8:37 PM Performed by: Claudia Desanctis, CRNA Pre-anesthesia Checklist: Patient identified, Emergency Drugs available, Suction available and Patient being monitored Patient Re-evaluated:Patient Re-evaluated prior to induction Oxygen Delivery Method: Circle system utilized Preoxygenation: Pre-oxygenation with 100% oxygen Induction Type: IV induction, Rapid sequence and Cricoid Pressure applied Ventilation: Mask ventilation without difficulty Laryngoscope Size: 2 and Miller Grade View: Grade I Tube type: Oral Tube size: 7.0 mm Number of attempts: 1 Airway Equipment and Method: Stylet Placement Confirmation: ETT inserted through vocal cords under direct vision,  positive ETCO2 and breath sounds checked- equal and bilateral Secured at: 22 cm Tube secured with: Tape Dental Injury: Teeth and Oropharynx as per pre-operative assessment

## 2017-10-28 NOTE — H&P (Signed)
Shannon [name] is an 62 y.o. female.   Chief Complaint: r flank pain HPI: 62 y.o. F smoker with sarcoidosis who presents to the ED with ~3 days of worsening abd pain.  It started more generalized then moved to her RLQ/R flank.  Denies fevers or nausea or change in bowel habits.    Past Medical History:  Diagnosis Date  . Herniated disc   . Hypertension   . Sciatica     Past Surgical History:  Procedure Laterality Date  . ABDOMINAL HYSTERECTOMY      Family History  Problem Relation Age of Onset  . Hypertension Mother   . Diabetes Father    Social History:  reports that she has been smoking cigarettes.  She has a 23.00 pack-year smoking history. she has never used smokeless tobacco. She reports that she does not drink alcohol or use drugs.  Allergies:  Allergies  Allergen Reactions  . Propoxyphene N-Acetaminophen Nausea And Vomiting  . Prednisone Rash     (Not in a hospital admission)  Results for orders placed or performed during the hospital encounter of 10/28/17 (from the past 48 hour(s))  Urinalysis, Routine w reflex microscopic     Status: Abnormal   Collection Time: 10/28/17  2:43 PM  Result Value Ref Range   Color, Urine YELLOW YELLOW   APPearance CLEAR CLEAR   Specific Gravity, Urine 1.015 1.005 - 1.030   pH 6.0 5.0 - 8.0   Glucose, UA NEGATIVE NEGATIVE mg/dL   Hgb urine dipstick MODERATE (A) NEGATIVE   Bilirubin Urine NEGATIVE NEGATIVE   Ketones, ur NEGATIVE NEGATIVE mg/dL   Protein, ur NEGATIVE NEGATIVE mg/dL   Nitrite NEGATIVE NEGATIVE   Leukocytes, UA NEGATIVE NEGATIVE   RBC / HPF 6-30 0 - 5 RBC/hpf   WBC, UA 0-5 0 - 5 WBC/hpf   Bacteria, UA RARE (A) NONE SEEN   Squamous Epithelial / LPF 0-5 (A) NONE SEEN   Mucus PRESENT     Comment: Performed at [facility], [place] [address], [place], [place] [address]  Lipase, blood     Status: None   Collection Time: 10/28/17  2:49 PM  Result Value Ref Range   Lipase 30 11 - 51 U/L    Comment:  Performed at [facility], [place] [address] [place], [place] [address]  Comprehensive metabolic panel     Status: Abnormal   Collection Time: 10/28/17  2:49 PM  Result Value Ref Range   Sodium 139 135 - 145 mmol/L   Potassium 3.3 (L) 3.5 - 5.1 mmol/L   Chloride 105 101 - 111 mmol/L   CO2 24 22 - 32 mmol/L   Glucose, Bld 101 (H) 65 - 99 mg/dL   BUN 10 6 - 20 mg/dL   Creatinine, Ser 0.67 0.44 - 1.00 mg/dL   Calcium 8.9 8.9 - 10.3 mg/dL   Total Protein 7.5 6.5 - 8.1 g/dL   Albumin 3.8 3.5 - 5.0 g/dL   AST 17 15 - 41 U/L   ALT 11 (L) 14 - 54 U/L   Alkaline Phosphatase 102 38 - 126 U/L   Total Bilirubin 0.6 0.3 - 1.2 mg/dL   GFR calc non Af Amer >60 >60 mL/min   GFR calc Af Amer >60 >60 mL/min    Comment: (NOTE) The eGFR has been calculated using the CKD EPI equation. This calculation has not been validated in all clinical situations. eGFR's persistently <60 mL/min signify possible Chronic Kidney Disease.    Anion  gap 10 5 - 15    Comment: Performed at [facility], [place] [address] [place], [place] [address]  CBC     Status: Abnormal   Collection Time: 10/28/17  2:49 PM  Result Value Ref Range   WBC 15.3 (H) 4.0 - 10.5 K/uL   RBC 4.85 3.87 - 5.11 MIL/uL   Hemoglobin 14.3 12.0 - 15.0 g/dL   HCT 42.6 36.0 - 46.0 %   MCV 87.8 78.0 - 100.0 fL   MCH 29.5 26.0 - 34.0 pg   MCHC 33.6 30.0 - 36.0 g/dL   RDW 13.5 11.5 - 15.5 %   Platelets 290 150 - 400 K/uL    Comment: Performed at [facility], [place] [address], [place], [place] [address]   Ct Abdomen Pelvis W Contrast  Result Date: 10/28/2017 CLINICAL DATA:  Mid epigastric pain radiating to the right flank, nausea EXAM: CT ABDOMEN AND PELVIS WITH CONTRAST TECHNIQUE: Multidetector CT imaging of the abdomen and pelvis was performed using the standard protocol following bolus administration of intravenous contrast. CONTRAST:  167m ISOVUE-300 IOPAMIDOL (ISOVUE-300) INJECTION 61%  COMPARISON:  11/18/2016, 11/10/2016, 02/21/2016 CT FINDINGS: Lower chest: Lung bases show no acute consolidation or pleural effusion. The heart size is borderline enlarged. Small right posterior fat containing diaphragmatic hernia Hepatobiliary: No focal liver abnormality is seen. No gallstones, gallbladder wall thickening, or biliary dilatation. Pancreas: Unremarkable. No pancreatic ductal dilatation or surrounding inflammatory changes. Spleen: Normal in size without focal abnormality. Adrenals/Urinary Tract: Similar appearance of adrenal gland nodules left greater than right. Mild right hydronephrosis and hydroureter, but no visible stones along the course of the ureter. Left kidney within normal limits. The bladder is unremarkable Stomach/Bowel: The stomach is nonenlarged. No dilated small bowel. Retrocecal appendix with ascending configuration and tip visible near the inferior right hepatic lobe. Marked periappendiceal inflammation. Luminal diameter up to 2 cm. Low-attenuation foci within the lumen of the appendix. No calcified stones. Vascular/Lymphatic: Mild aortic atherosclerosis. No aneurysmal dilatation. No significantly enlarged lymph nodes. Reproductive: Status post hysterectomy. No adnexal masses. Other: Negative for free air or free fluid Musculoskeletal: Grade 1 anterolisthesis of L4 on L5. Degenerative changes at L4-L5 and L5-S1. No acute or suspicious lesion IMPRESSION: 1. Findings consistent with acute appendicitis. Given marked luminal diameter up to 2 cm and the presence of low attenuating foci within the lumen of the inflamed appendix, consider possible appendicitis with mucocele. Appendix: Location: Retrocecal with ascending configuration, tip position beneath the inferior tip of the right lobe of liver Diameter: 2 cm Appendicolith: Not seen Mucosal hyper-enhancement: Not seen Extraluminal gas: Not seen Periappendiceal collection: Moderate surrounding inflammation but no discrete focal fluid  collection 2. Mild right hydronephrosis and hydroureter, but no stones along the course of the right ureter. Electronically Signed   By: KDonavan FoilM.D.   On: 10/28/2017 18:22    Review of Systems  Constitutional: Negative for chills and fever.  HENT: Negative for hearing loss.   Eyes: Negative for blurred vision and double vision.  Respiratory: Negative for cough and shortness of breath.   Cardiovascular: Negative for chest pain and palpitations.  Gastrointestinal: Positive for abdominal pain. Negative for nausea and vomiting.  Genitourinary: Negative for dysuria, frequency and urgency.  Musculoskeletal: Negative for myalgias.  Skin: Negative for itching and rash.  Neurological: Negative for dizziness.    Blood pressure (!) 142/88, pulse 94, temperature 98.2 F (36.8 C), temperature source Oral, resp. rate 16, height 5' 6"  (1.676 m), weight 106.1 kg (  234 lb), SpO2 99 %. Physical Exam  Constitutional: She is oriented to person, place, and time. She appears well-developed and well-nourished.  HENT:  Head: Normocephalic and atraumatic.  Eyes: Conjunctivae and EOM are normal. Pupils are equal, round, and reactive to light.  Neck: Normal range of motion. Neck supple.  Cardiovascular: Normal rate and regular rhythm.  Respiratory: Effort normal. No respiratory distress.  GI: Soft. There is tenderness. There is no rebound and no guarding.  Musculoskeletal: Normal range of motion.  Neurological: She is alert and oriented to person, place, and time.  Skin: Skin is warm and dry.     Assessment/Plan 62 y.o. F with appendicitis.  I have recommended laparoscopic appendectomy.  Risks including bleeding, infection, leak of staple line, damage to adjacent structures and hernia were explained. We discussed the liklihood of this resolving her symptoms.  I believe she understands this and has agreed to proceed.    Rosario Adie., MD 01/22/7997, 7:10 PM

## 2017-10-29 ENCOUNTER — Encounter (HOSPITAL_COMMUNITY): Payer: Self-pay | Admitting: General Surgery

## 2017-10-29 DIAGNOSIS — K3580 Unspecified acute appendicitis: Secondary | ICD-10-CM | POA: Diagnosis not present

## 2017-10-29 MED ORDER — HYDROCODONE-ACETAMINOPHEN 5-325 MG PO TABS: 1.0000 | ORAL_TABLET | ORAL | 0 refills | Status: DC | PRN

## 2017-10-29 MED ORDER — NAPROXEN 250 MG PO TABS
500.0000 mg | ORAL_TABLET | Freq: Two times a day (BID) | ORAL | Status: DC
  Administered 2017-10-30: 500 mg via ORAL
  Filled 2017-10-29 (×3): qty 2

## 2017-10-29 MED ORDER — ACETAMINOPHEN 500 MG PO TABS: 1000.0000 mg | ORAL_TABLET | Freq: Four times a day (QID) | ORAL | 0 refills | Status: DC | PRN

## 2017-10-29 NOTE — Progress Notes (Addendum)
1 Day Post-Op    CC: abdominal pain  Subjective: Patient doing well postop.  She is pretty sore.  She has not been out of bed yet.  Tolerating clears well.   Has some chronic pain and depression issues.  She is very vague about her medical background and says she cannot really remember  Objective: Vital signs in last 24 hours: Temp:  [97.6 F (36.4 C)-98.2 F (36.8 C)] 98.1 F (36.7 C) (02/28 0610) Pulse Rate:  [68-94] 74 (02/28 0610) Resp:  [15-17] 16 (02/28 0610) BP: (138-171)/(73-97) 138/75 (02/28 0936) SpO2:  [96 %-100 %] 96 % (02/28 0845) Weight:  [106.1 kg (234 lb)] 106.1 kg (234 lb) (02/27 1425) Last BM Date: 10/28/17 1025 IV 250 urine Afebrile, VSS No labs this Am   Intake/Output from previous day: 02/27 0701 - 02/28 0700 In: 1025 [I.V.:1025] Out: 255 [Urine:250; Blood:5] Intake/Output this shift: No intake/output data recorded.  General appearance: alert, cooperative and no distress Resp: clear to auscultation bilaterally GI: Soft, sore, port sites look good.  Lab Results:  Recent Labs    10/28/17 1449  WBC 15.3*  HGB 14.3  HCT 42.6  PLT 290    BMET Recent Labs    10/28/17 1449  NA 139  K 3.3*  CL 105  CO2 24  GLUCOSE 101*  BUN 10  CREATININE 0.67  CALCIUM 8.9   PT/INR No results for input(s): LABPROT, INR in the last 72 hours.  Recent Labs  Lab 10/28/17 1449  AST 17  ALT 11*  ALKPHOS 102  BILITOT 0.6  PROT 7.5  ALBUMIN 3.8     Lipase     Component Value Date/Time   LIPASE 30 10/28/2017 1449     Prior to Admission medications   Medication Sig Start Date End Date Taking? Authorizing Provider  acetaminophen (TYLENOL) 500 MG tablet Take 1,000 mg by mouth every 6 (six) hours as needed for moderate pain.   Yes [provider]  albuterol (VENTOLIN HFA) 108 (90 BASE) MCG/ACT inhaler Inhale 2 puffs into the lungs every 6 (six) hours as needed for wheezing or shortness of breath.   Yes [provider]  BLACK  CURRANT SEED OIL PO Take 10 mLs by mouth daily.   Yes [provider]  CHANTIX STARTING MONTH PAK 0.5 MG X 11 & 1 MG X 42 tablet Take 0.5-1 mg by mouth 2 (two) times daily. Take 0.5 mg tablet BID for 7 Days Then Take 1 mg tablet BID for 21 Days. 09/28/17  Yes [provider]  DULoxetine (CYMBALTA) 20 MG capsule Take 20 mg by mouth 2 (two) times daily.  09/21/17  Yes [provider]  gabapentin (NEURONTIN) 300 MG capsule Take 300 mg by mouth at bedtime.   Yes [provider]  losartan-hydrochlorothiazide (HYZAAR) 100-12.5 MG tablet Take 1 tablet by mouth daily.  09/11/16  Yes [provider]  methocarbamol (ROBAXIN) 500 MG tablet Take 1 tablet (500 mg total) by mouth at bedtime and may repeat dose one time if needed. 06/07/17  Yes Recardo Evangelist, PA-C  naproxen sodium (ANAPROX) 220 MG tablet Take 660 mg by mouth 2 (two) times daily as needed (pain).    Yes [provider]  pantoprazole (PROTONIX) 20 MG tablet Take 1 tablet (20 mg total) by mouth daily. 11/18/16  Yes Charlesetta Shanks, MD  Probiotic Product (PROBIOTIC PO) Take 1 capsule by mouth daily.   Yes [provider]  Psyllium (FIBER) 0.52 g CAPS Take 1  capsule by mouth daily.   Yes [provider]  rOPINIRole (REQUIP) 0.5 MG tablet Take 1 tablet by mouth at bedtime. 06/03/15  Yes [provider]  cephALEXin (KEFLEX) 500 MG capsule Take 2 capsules (1,000 mg total) by mouth 2 (two) times daily. Patient not taking: Reported on 10/28/2017 11/18/16   Charlesetta Shanks, MD  docusate sodium (COLACE) 100 MG capsule Take 1 capsule (100 mg total) by mouth every 12 (twelve) hours. Patient not taking: Reported on 10/28/2017 11/18/16   Charlesetta Shanks, MD  HYDROcodone-acetaminophen (NORCO/VICODIN) 5-325 MG tablet Take 1-2 tablets by mouth every 4 (four) hours as needed for moderate pain or severe pain. Patient not taking: Reported on 10/28/2017 11/18/16   Charlesetta Shanks, MD  naproxen  (NAPROSYN) 375 MG tablet Take 1 tablet (375 mg total) by mouth 2 (two) times daily. Patient not taking: Reported on 10/28/2017 11/18/16   Charlesetta Shanks, MD  nebivolol (BYSTOLIC) 5 MG tablet Take 1 tablet (5 mg total) by mouth daily. Patient not taking: Reported on 11/18/2016 07/24/15   Tanda Rockers, MD  ondansetron (ZOFRAN) 4 MG tablet Take 1 tablet (4 mg total) by mouth every 6 (six) hours. Patient not taking: Reported on 07/03/2015 01/01/14   Sciacca, Mable Fill, PA-C  traMADol (ULTRAM) 50 MG tablet Take 1 tablet (50 mg total) by mouth every 6 (six) hours as needed. Patient not taking: Reported on 10/28/2017 06/07/17   Recardo Evangelist, PA-C    Medications: . DULoxetine  20 mg Oral BID  . enoxaparin (LOVENOX) injection  40 mg Subcutaneous Q24H  . gabapentin  300 mg Oral QHS  . hydrochlorothiazide  12.5 mg Oral Daily  . HYDROmorphone      . losartan  100 mg Oral Daily  . methocarbamol  500 mg Oral QHS,MR X 1  . ondansetron      . pantoprazole  20 mg Oral Daily  . psyllium  1 packet Oral Daily  . rOPINIRole  0.5 mg Oral QHS  . senna-docusate  1 tablet Oral QHS  . varenicline  1 mg Oral BID   . dextrose 5 % and 0.45 % NaCl with KCl 20 mEq/L 75 mL/hr at 10/28/17 2320   Anti-infectives (From admission, onward)   Start     Dose/Rate Route Frequency Ordered Stop   10/28/17 1930  cefTRIAXone (ROCEPHIN) 2 g in sodium chloride 0.9 % 100 mL IVPB  Status:  Discontinued    Comments:  Pharmacy may adjust dosing strength / duration / interval for maximal efficacy   2 g 200 mL/hr over 30 Minutes Intravenous Every 24 hours 10/28/17 1910 10/28/17 2235      Assessment/Plan Hypertension Sarcoidosis Chronic pain -back and abdomen. Chronic fatigue syndrome Depression History of tobacco use Body mass index is 37.7  Acute appendicitis Laparoscopic appendectomy 05/15/77, Dr. Leighton Ruff   FEN: IV fluids/clear liquids ID: Preop antibiotics DVT: SCDs/Lovenox Foley: None Follow-up: [facility]   Plan: Saline lock IV ambulate in the halls, advance diet, resume home meds, hopefully home later today.   I have personally reviewed the patients medication history on the [place] controlled substance database.   LOS: 0 days    JENNINGS,WILLARD 10/29/2017 [phone]  Agree with above. She has some pre op pain issues. She hurts now and does not feel good.  I expect that she will stay until tomorrow.  She lives with her boyfriend, Audry Pili.  Alphonsa Overall, MD, [facility] Pager: [phone] Office phone:  [phone]

## 2017-10-29 NOTE — Discharge Instructions (Signed)
Laparoscopic Appendectomy, Adult, Care After °These instructions give you information about caring for yourself after your procedure. Your doctor may also give you more specific instructions. Call your doctor if you have any problems or questions after your procedure. °Follow these instructions at home: °Medicines °· Take over-the-counter and prescription medicines only as told by your doctor. °· Do not drive for 24 hours if you received a sedative. °· Do not drive or use heavy machinery while taking prescription pain medicine. °· If you were prescribed an antibiotic medicine, take it as told by your doctor. Do not stop taking it even if you start to feel better. °Activity °· Do not lift anything that is heavier than 10 pounds (4.5 kg) for 3 weeks or as told by your doctor. °· Do not play contact sports for 3 weeks or as told by your doctor. °· Slowly return to your normal activities. °Bathing °· Keep your cuts from surgery (incisions) clean and dry. °? Gently wash the cuts with soap and water. °? Rinse the cuts with water until the soap is gone. °? Pat the cuts dry with a clean towel. Do not rub the cuts. °· You may take showers after 48 hours. °· Do not take baths, swim, or use a hot tub for 2 weeks or as told by your doctor. °Cut Care °· Follow instructions from your doctor about how to take care of your cuts. Make sure you: °? Wash your hands with soap and water before you change your bandage (dressing). If you do not have soap and water, use hand sanitizer. °? Change your bandage as told by your doctor. °? Leave stitches (sutures), skin glue, or skin tape (adhesive) strips in place. They may need to stay in place for 2 weeks or longer. If tape strips get loose and curl up, you may trim the loose edges. Do not remove tape strips completely unless your doctor says it is okay. °· Check your cuts every day for signs of infection. Check for: °? More redness, swelling, or pain. °? More fluid or  blood. °? Warmth. °? Pus or a bad smell. °Other Instructions °· If you were sent home with a drain, follow instructions from your doctor about how to use it and care for it. °· Take deep breaths. This helps to keep your lungs from getting swollen (inflamed). °· To help with constipation: °? Drink plenty of fluids. °? Eat plenty of fruits and vegetables. °· Keep all follow-up visits as told by your doctor. This is important. °Contact a doctor if: °· You have more redness, swelling, or pain around a cut from surgery. °· You have more fluid or blood coming from a cut. °· Your cut feels warm to the touch. °· You have pus or a bad smell coming from a cut or a bandage. °· The edges of a cut break open after the stitches have been taken out. °· You have pain in your shoulders that gets worse. °· You feel dizzy or you pass out (faint). °· You have shortness of breath. °· You keep feeling sick to your stomach (nauseous). °· You keep throwing up (vomiting). °· You get diarrhea or you cannot control your poop. °· You lose your appetite. °· You have swelling or pain in your legs. °Get help right away if: °· You have a fever. °· You get a rash. °· You have trouble breathing. °· You have sharp pains in your chest. °This information is not intended to replace advice given   to you by your health care provider. Make sure you discuss any questions you have with your health care provider. °Document Released: 06/14/2009 Document Revised: 01/24/2016 Document Reviewed: 02/05/2015 °Elsevier Interactive Patient Education © 2018 [employer] ° °CCS ______[facility] °LAPAROSCOPIC SURGERY: POST OP INSTRUCTIONS °Always review your discharge instruction sheet given to you by the facility where your surgery was performed. °IF YOU HAVE DISABILITY OR FAMILY LEAVE FORMS, YOU MUST BRING THEM TO THE OFFICE FOR PROCESSING.   °DO NOT GIVE THEM TO YOUR DOCTOR. ° °1. A prescription for pain medication may be given to you upon  discharge.  Take your pain medication as prescribed, if needed.  If narcotic pain medicine is not needed, then you may take acetaminophen (Tylenol) or ibuprofen (Advil) as needed. °2. Take your usually prescribed medications unless otherwise directed. °3. If you need a refill on your pain medication, please contact your pharmacy.  They will contact our office to request authorization. Prescriptions will not be filled after 5pm or on week-ends. °4. You should follow a light diet the first few days after arrival home, such as soup and crackers, etc.  Be sure to include lots of fluids daily. °5. Most patients will experience some swelling and bruising in the area of the incisions.  Ice packs will help.  Swelling and bruising can take several days to resolve.  °6. It is common to experience some constipation if taking pain medication after surgery.  Increasing fluid intake and taking a stool softener (such as Colace) will usually help or prevent this problem from occurring.  A mild laxative (Milk of Magnesia or Miralax) should be taken according to package instructions if there are no bowel movements after 48 hours. °7. Unless discharge instructions indicate otherwise, you may remove your bandages 24-48 hours after surgery, and you may shower at that time.  You may have steri-strips (small skin tapes) in place directly over the incision.  These strips should be left on the skin for 7-10 days.  If your surgeon used skin glue on the incision, you may shower in 24 hours.  The glue will flake off over the next 2-3 weeks.  Any sutures or staples will be removed at the office during your follow-up visit. °8. ACTIVITIES:  You may resume regular (light) daily activities beginning the next day--such as daily self-care, walking, climbing stairs--gradually increasing activities as tolerated.  You may have sexual intercourse when it is comfortable.  Refrain from any heavy lifting or straining until approved by your doctor. °a. You  may drive when you are no longer taking prescription pain medication, you can comfortably wear a seatbelt, and you can safely maneuver your car and apply brakes. °b. RETURN TO WORK:  __________________________________________________________ °9. You should see your doctor in the office for a follow-up appointment approximately 2-3 weeks after your surgery.  Make sure that you call for this appointment within a day or two after you arrive home to insure a convenient appointment time. °10. OTHER INSTRUCTIONS: __________________________________________________________________________________________________________________________ __________________________________________________________________________________________________________________________ °WHEN TO CALL YOUR DOCTOR: °1. Fever over 101.0 °2. Inability to urinate °3. Continued bleeding from incision. °4. Increased pain, redness, or drainage from the incision. °5. Increasing abdominal pain ° °The clinic staff is available to answer your questions during regular business hours.  Please don’t hesitate to call and ask to speak to one of the nurses for clinical concerns.  If you have a medical emergency, go to the nearest emergency room or call 911.  A surgeon from   [facility] is always on call at the hospital. °[address], [address], [place], [place]  [address] ? [address] [address], [place], [place]   [address] °[phone] ? [phone] ? FAX [phone] °Web site: [email or web address] ° °

## 2017-10-30 DIAGNOSIS — K3580 Unspecified acute appendicitis: Secondary | ICD-10-CM | POA: Diagnosis not present

## 2017-10-30 NOTE — Care Management Obs Status (Signed)
[facility] NOTIFICATION   Patient Details  Name: Shannon [name] MRN: [id number] Date of Birth: 1956/03/26   Medicare Observation Status Notification Given:  Yes    Guadalupe Maple, RN 10/30/2017, 1:05 PM

## 2017-10-30 NOTE — Progress Notes (Signed)
DC instructions reviewed with patient, paper prescriptions provided. IV removed. Pt to call ride.

## 2017-10-30 NOTE — Evaluation (Signed)
Physical Therapy Evaluation Patient Details Name: Shannon [name] MRN: [id number] DOB: 01/01/1956 Today's Date: 10/30/2017   History of Present Illness  s/p appendectomy 10/29/17;   Clinical Impression  Patient evaluated by Physical Therapy with no further acute PT needs identified. All education has been completed and the patient has no further questions. Pt is moving slowly but is anxious to d/c home; pt reports she will have necessary assist at home and that she will be able to manage; she has a RW and shower seat; encouraged her to utilize both and to mobilize approximately every hour at home; support of the RW currently helps her abd pain during mobility; pt verbalizes understanding of the above; limited by fatigue and pain today; See below for any follow-up Physical Therapy or equipment needs. PT is signing off. Thank you for this referral.    Follow Up Recommendations No PT follow up    Equipment Recommendations  None recommended by PT    Recommendations for Other Services       Precautions / Restrictions Precautions Precautions: Fall Restrictions Weight Bearing Restrictions: No      Mobility  Bed Mobility               General bed mobility comments: pt reports Independent from flat bed with nursing staff; pt received in chair by PT  Transfers Overall transfer level: Needs assistance   Transfers: Sit to/from Stand Sit to Stand: Min guard         General transfer comment: incr time, cues to power up with LEs and control descent  Ambulation/Gait Ambulation/Gait assistance: Min guard;Supervision Ambulation Distance (Feet): 50 Feet Assistive device: Rolling walker (2 wheeled) Gait Pattern/deviations: Step-through pattern;Decreased stride length     General Gait Details: slow gait, encouraged trunk extension, pt tneds to lean forward d/t abd pain  Stairs            Wheelchair Mobility    Modified Rankin (Stroke Patients Only)       Balance Overall  balance assessment: Needs assistance Sitting-balance support: No upper extremity supported;Feet supported Sitting balance-Leahy Scale: Fair       Standing balance-Leahy Scale: Poor Standing balance comment: reliant on UEs                             Pertinent Vitals/Pain Pain Assessment: 0-10 Pain Score: 4  Pain Location: abd with activity Pain Descriptors / Indicators: Discomfort;Sore;Grimacing Pain Intervention(s): Monitored during session    Home Living Family/patient expects to be discharged to:: Private residence Living Arrangements: Spouse/significant other   Type of Home: House Home Access: Stairs to enter   Technical brewer of Steps: 1 Home Layout: One level Home Equipment: Environmental consultant - 2 wheels;Shower seat Additional Comments: pt lives with boyfriend, he is a long distance Film/video editor Level of Independence: Independent         Comments: pt reports her boyfriend's sister will help with meals, household tasks, etc     Hand Dominance        Extremity/Trunk Assessment   Upper Extremity Assessment Upper Extremity Assessment: Overall WFL for tasks assessed    Lower Extremity Assessment Lower Extremity Assessment: Overall WFL for tasks assessed       Communication   Communication: No difficulties  Cognition Arousal/Alertness: Awake/alert Behavior During Therapy: WFL for tasks assessed/performed Overall Cognitive Status: Within Functional Limits for tasks assessed  General Comments      Exercises     Assessment/Plan    PT Assessment Patent does not need any further PT services  PT Problem List         PT Treatment Interventions      PT Goals (Current goals can be found in the Care Plan section)  Acute Rehab PT Goals Patient Stated Goal: to go home! PT Goal Formulation: All assessment and education complete, DC therapy(pt being d/c today per PA)     Frequency     Barriers to discharge        Co-evaluation               AM-PAC PT "6 Clicks" Daily Activity  Outcome Measure Difficulty turning over in bed (including adjusting bedclothes, sheets and blankets)?: A Lot Difficulty moving from lying on back to sitting on the side of the bed? : A Lot Difficulty sitting down on and standing up from a chair with arms (e.g., wheelchair, bedside commode, etc,.)?: A Little Help needed moving to and from a bed to chair (including a wheelchair)?: A Little Help needed walking in hospital room?: A Little Help needed climbing 3-5 steps with a railing? : A Lot 6 Click Score: 15    End of Session   Activity Tolerance: Patient limited by pain;Patient limited by fatigue Patient left: in chair;with call bell/phone within reach   PT Visit Diagnosis: Unsteadiness on feet (R26.81)    Time: 8916-9450 PT Time Calculation (min) (ACUTE ONLY): 16 min   Charges:   PT Evaluation $PT Eval Low Complexity: 1 Low     PT G CodesKenyon Ana, PT Pager: [phone] 10/30/2017   [facility] 10/30/2017, 10:14 AM

## 2017-10-30 NOTE — Progress Notes (Addendum)
Physician Discharge Summary  Patient ID: Shannon [name] MRN: [id number] DOB/AGE: 1956-07-19 62 y.o.  Admit date: 10/28/2017 Discharge date: 10/30/2017  Admission Diagnoses:  Acute appendicitis Hypertension Sarcoidosis Chronic pain-back and abdomen. Chronic fatigue syndrome Depression History of tobacco use Body mass index is 37.7    Discharge Diagnoses:  Same  Active Problems:   Appendicitis   PROCEDURES: Laparoscopic appendectomy 9/98/33, Dr. Moishe Spice Course:  62 y.o. F smoker with sarcoidosis who presents to the ED with ~3 days of worsening abd pain.  It started more generalized then moved to her RLQ/R flank.  Denies fevers or nausea or change in bowel habits.  Pt was seen and taken to the OR by Dr. Marcello Moores.    She was slow to move and had some issues with nausea the first post op day.  She lives alone and has some friends to help.  She was not ready the first post op day for discharge.  Today she is doing better, she is walking with PT and using a walker.    Plan on discharge later today when family/friends can take her home.    CBC Latest Ref Rng & Units 10/28/2017 11/18/2016 01/01/2014  WBC 4.0 - 10.5 K/uL 15.3(H) 12.6(H) -  Hemoglobin 12.0 - 15.0 g/dL 14.3 14.8 16.7(H)  Hematocrit 36.0 - 46.0 % 42.6 44.1 49.0(H)  Platelets 150 - 400 K/uL 290 319 -   CMP Latest Ref Rng & Units 10/28/2017 11/18/2016 01/01/2014  Glucose 65 - 99 mg/dL 101(H) 129(H) 112(H)  BUN 6 - 20 mg/dL 10 14 8   Creatinine 0.44 - 1.00 mg/dL 0.67 0.74 0.90  Sodium 135 - 145 mmol/L 139 142 142  Potassium 3.5 - 5.1 mmol/L 3.3(L) 3.3(L) 3.6(L)  Chloride 101 - 111 mmol/L 105 110 104  CO2 22 - 32 mmol/L 24 24 -  Calcium 8.9 - 10.3 mg/dL 8.9 9.6 -  Total Protein 6.5 - 8.1 g/dL 7.5 7.9 -  Total Bilirubin 0.3 - 1.2 mg/dL 0.6 0.4 -  Alkaline Phos 38 - 126 U/L 102 93 -  AST 15 - 41 U/L 17 18 -  ALT 14 - 54 U/L 11(L) 16 -   Prior to Admission medications   Medication Sig Start Date End Date  Taking? Authorizing Provider  albuterol (VENTOLIN HFA) 108 (90 BASE) MCG/ACT inhaler Inhale 2 puffs into the lungs every 6 (six) hours as needed for wheezing or shortness of breath.   Yes [provider]  BLACK CURRANT SEED OIL PO Take 10 mLs by mouth daily.   Yes [provider]  CHANTIX STARTING MONTH PAK 0.5 MG X 11 & 1 MG X 42 tablet Take 0.5-1 mg by mouth 2 (two) times daily. Take 0.5 mg tablet BID for 7 Days Then Take 1 mg tablet BID for 21 Days. 09/28/17  Yes [provider]  DULoxetine (CYMBALTA) 20 MG capsule Take 20 mg by mouth 2 (two) times daily.  09/21/17  Yes [provider]  gabapentin (NEURONTIN) 300 MG capsule Take 300 mg by mouth at bedtime.   Yes [provider]  losartan-hydrochlorothiazide (HYZAAR) 100-12.5 MG tablet Take 1 tablet by mouth daily.  09/11/16  Yes [provider]  methocarbamol (ROBAXIN) 500 MG tablet Take 1 tablet (500 mg total) by mouth at bedtime and may repeat dose one time if needed. 06/07/17  Yes Recardo Evangelist, PA-C  naproxen sodium (ANAPROX) 220 MG tablet Take 660 mg by mouth 2 (two) times daily as needed (pain).  Yes [provider]  pantoprazole (PROTONIX) 20 MG tablet Take 1 tablet (20 mg total) by mouth daily. 11/18/16  Yes Charlesetta Shanks, MD  Probiotic Product (PROBIOTIC PO) Take 1 capsule by mouth daily.   Yes [provider]  Psyllium (FIBER) 0.52 g CAPS Take 1 capsule by mouth daily.   Yes [provider]  rOPINIRole (REQUIP) 0.5 MG tablet Take 1 tablet by mouth at bedtime. 06/03/15  Yes [provider]  acetaminophen (TYLENOL) 500 MG tablet Take 2 tablets (1,000 mg total) by mouth every 6 (six) hours as needed for moderate pain. DO NOT TAKE MORE THAN 4000 MG OF TYLENOL PER DAY.  IT CAN HARM YOUR LIVER.  TYLENOL (ACETAMINOPHEN) IS ALSO IN YOUR PRESCRIPTION PAIN MEDICATION.  YOU HAVE TO COUNT IT IN YOUR DAILY TOTAL. 10/29/17   Earnstine Regal, PA-C  cephALEXin  (KEFLEX) 500 MG capsule Take 2 capsules (1,000 mg total) by mouth 2 (two) times daily. Patient not taking: Reported on 10/28/2017 11/18/16   Charlesetta Shanks, MD  docusate sodium (COLACE) 100 MG capsule Take 1 capsule (100 mg total) by mouth every 12 (twelve) hours. Patient not taking: Reported on 10/28/2017 11/18/16   Charlesetta Shanks, MD  HYDROcodone-acetaminophen (NORCO/VICODIN) 5-325 MG tablet Take 1-2 tablets by mouth every 4 (four) hours as needed for moderate pain or severe pain. Patient not taking: Reported on 10/28/2017 11/18/16   Charlesetta Shanks, MD  HYDROcodone-acetaminophen (NORCO/VICODIN) 5-325 MG tablet Take 1-2 tablets by mouth every 4 (four) hours as needed for moderate pain. 10/29/17   Earnstine Regal, PA-C  naproxen (NAPROSYN) 375 MG tablet Take 1 tablet (375 mg total) by mouth 2 (two) times daily. Patient not taking: Reported on 10/28/2017 11/18/16   Charlesetta Shanks, MD  nebivolol (BYSTOLIC) 5 MG tablet Take 1 tablet (5 mg total) by mouth daily. Patient not taking: Reported on 11/18/2016 07/24/15   Tanda Rockers, MD  ondansetron (ZOFRAN) 4 MG tablet Take 1 tablet (4 mg total) by mouth every 6 (six) hours. Patient not taking: Reported on 07/03/2015 01/01/14   Sciacca, Mable Fill, PA-C  traMADol (ULTRAM) 50 MG tablet Take 1 tablet (50 mg total) by mouth every 6 (six) hours as needed. Patient not taking: Reported on 10/28/2017 06/07/17   Recardo Evangelist, PA-C   Condition on d/c:  Improved      Disposition: 01-Home or Self Care   Allergies as of 10/30/2017      Reactions   Propoxyphene N-acetaminophen Nausea And Vomiting   Prednisone Rash      Medication List    STOP taking these medications   cephALEXin 500 MG capsule Commonly known as:  KEFLEX   docusate sodium 100 MG capsule Commonly known as:  COLACE   naproxen 375 MG tablet Commonly known as:  NAPROSYN   nebivolol 5 MG tablet Commonly known as:  BYSTOLIC   ondansetron 4 MG tablet Commonly known as:  ZOFRAN    traMADol 50 MG tablet Commonly known as:  ULTRAM     TAKE these medications   acetaminophen 500 MG tablet Commonly known as:  TYLENOL Take 2 tablets (1,000 mg total) by mouth every 6 (six) hours as needed for moderate pain. DO NOT TAKE MORE THAN 4000 MG OF TYLENOL PER DAY.  IT CAN HARM YOUR LIVER.  TYLENOL (ACETAMINOPHEN) IS ALSO IN YOUR PRESCRIPTION PAIN MEDICATION.  YOU HAVE TO COUNT IT IN YOUR DAILY TOTAL. What changed:  additional instructions   BLACK CURRANT SEED OIL PO Take 10 mLs by mouth  daily.   CHANTIX STARTING MONTH PAK 0.5 MG X 11 & 1 MG X 42 tablet Generic drug:  varenicline Take 0.5-1 mg by mouth 2 (two) times daily. Take 0.5 mg tablet BID for 7 Days Then Take 1 mg tablet BID for 21 Days.   DULoxetine 20 MG capsule Commonly known as:  CYMBALTA Take 20 mg by mouth 2 (two) times daily.   Fiber 0.52 g Caps Take 1 capsule by mouth daily.   gabapentin 300 MG capsule Commonly known as:  NEURONTIN Take 300 mg by mouth at bedtime.   HYDROcodone-acetaminophen 5-325 MG tablet Commonly known as:  NORCO/VICODIN Take 1-2 tablets by mouth every 4 (four) hours as needed for moderate pain. What changed:  reasons to take this   losartan-hydrochlorothiazide 100-12.5 MG tablet Commonly known as:  HYZAAR Take 1 tablet by mouth daily.   methocarbamol 500 MG tablet Commonly known as:  ROBAXIN Take 1 tablet (500 mg total) by mouth at bedtime and may repeat dose one time if needed.   naproxen sodium 220 MG tablet Commonly known as:  ALEVE Take 660 mg by mouth 2 (two) times daily as needed (pain).   pantoprazole 20 MG tablet Commonly known as:  PROTONIX Take 1 tablet (20 mg total) by mouth daily.   PROBIOTIC PO Take 1 capsule by mouth daily.   rOPINIRole 0.5 MG tablet Commonly known as:  REQUIP Take 1 tablet by mouth at bedtime.   VENTOLIN HFA 108 (90 Base) MCG/ACT inhaler Generic drug:  albuterol Inhale 2 puffs into the lungs every 6 (six) hours as needed for  wheezing or shortness of breath.      Follow-up Information    [facility] [place] Follow up on 12/11/2017.   Specialty:  General Surgery Why:  Your appointment is on 11 AM.  Be at the office 30 minutes early for check in.  Bring photo ID and insurance information. Contact information: [address] [place] [address] [phone]        Rankins, Bill Salinas, MD Follow up.   Specialty:  Family Medicine Why:  Call for follow up of your other medical issues and further medical needs. Contact information: [place] [address] [phone]           Signed: Earnstine Regal 10/30/2017, 9:43 AM   Agree with above.  Alphonsa Overall, MD, [facility] Pager: [phone] Office phone:  [phone]

## 2017-10-30 NOTE — Progress Notes (Addendum)
2 Days Post-Op    CC:  Abdominal pain  Subjective: Up in the chair, taking clear liquids, full liquids and Fritos.  She is slow moving and uses a walker at home some.  Really needs it now for abdominal pain.  She is fairly sensitive to the pain, but we have PT up walking her now.    She has some family (boyfriend's sister) to help some at home and she would like to go.  She took 8 Vicodin yesterday.  Objective: Vital signs in last 24 hours: Temp:  [98.2 F (36.8 C)-98.4 F (36.9 C)] 98.2 F (36.8 C) (03/01 0500) Pulse Rate:  [72-80] 72 (03/01 0500) Resp:  [16-18] 18 (03/01 0500) BP: (133-158)/(69-80) 158/77 (03/01 0500) SpO2:  [92 %-95 %] 93 % (03/01 0500) Last BM Date: 10/28/17 1200 PO Voided x 1 recorded Afebrile, VSS NO labs Intake/Output from previous day: 02/28 0701 - 03/01 0700 In: 1200 [P.O.:1200] Out: -  Intake/Output this shift: No intake/output data recorded.  General appearance: alert, cooperative and no distress Resp: clear to auscultation bilaterally GI: soft, very sore, sites all look fine.  Lab Results:  Recent Labs    10/28/17 1449  WBC 15.3*  HGB 14.3  HCT 42.6  PLT 290    BMET Recent Labs    10/28/17 1449  NA 139  K 3.3*  CL 105  CO2 24  GLUCOSE 101*  BUN 10  CREATININE 0.67  CALCIUM 8.9   PT/INR No results for input(s): LABPROT, INR in the last 72 hours.  Recent Labs  Lab 10/28/17 1449  AST 17  ALT 11*  ALKPHOS 102  BILITOT 0.6  PROT 7.5  ALBUMIN 3.8     Lipase     Component Value Date/Time   LIPASE 30 10/28/2017 1449     Medications: . DULoxetine  20 mg Oral BID  . enoxaparin (LOVENOX) injection  40 mg Subcutaneous Q24H  . gabapentin  300 mg Oral QHS  . hydrochlorothiazide  12.5 mg Oral Daily  . losartan  100 mg Oral Daily  . methocarbamol  500 mg Oral QHS,MR X 1  . naproxen  500 mg Oral BID WC  . pantoprazole  20 mg Oral Daily  . psyllium  1 packet Oral Daily  . rOPINIRole  0.5 mg Oral QHS  . senna-docusate   1 tablet Oral QHS  . varenicline  1 mg Oral BID     Assessment/Plan Hypertension Sarcoidosis Chronic pain -back and abdomen. Chronic fatigue syndrome Depression History of tobacco use Body mass index is 37.7  Acute appendicitis Laparoscopic appendectomy 12/25/81, Dr. Leighton Ruff   FEN: regular diet ID: Preop antibiotics DVT: SCDs/Lovenox Foley: None Follow-up: [facility]  Plan:  DC home today.      LOS: 0 days   JENNINGS,WILLARD 10/30/2017 [id number]  Agree with above. She has been slow to be active.  Alphonsa Overall, MD, [facility] Surgery Pager: [phone] Office phone:  [phone]

## 2017-11-02 NOTE — Discharge Summary (Signed)
Patient ID: Shannon [name] [id number] 62 y.o. 10-Mar-1956  10/28/2017  Discharge date and time: 10/30/2017  1:40 PM  Admitting Physician: CCS,MD  Discharge Physician: CCS, MD  Admission Diagnoses: Acute appendicitis, unspecified acute appendicitis type [K35.80] Hematuria, unspecified type [R31.9]  Discharge Diagnoses: acute appendicitins  Operations: Procedure(s): APPENDECTOMY LAPAROSCOPIC    Discharged Condition: good    Hospital Course: Pt admitted after surgery.  She was discharged when tolerating a diet and pain was controlled.  Consults: None  Significant Diagnostic Studies: labs: cbc  Treatments: surgery: lap appy  Disposition: Home

## 2017-11-25 DIAGNOSIS — I10 Essential (primary) hypertension: Secondary | ICD-10-CM | POA: Diagnosis not present

## 2017-11-25 DIAGNOSIS — R6889 Other general symptoms and signs: Secondary | ICD-10-CM | POA: Diagnosis not present

## 2017-11-25 DIAGNOSIS — E559 Vitamin D deficiency, unspecified: Secondary | ICD-10-CM | POA: Diagnosis not present

## 2017-11-25 DIAGNOSIS — R5383 Other fatigue: Secondary | ICD-10-CM | POA: Diagnosis not present

## 2017-11-25 DIAGNOSIS — M791 Myalgia, unspecified site: Secondary | ICD-10-CM | POA: Diagnosis not present

## 2017-11-25 DIAGNOSIS — L659 Nonscarring hair loss, unspecified: Secondary | ICD-10-CM | POA: Diagnosis not present

## 2017-11-25 DIAGNOSIS — M2550 Pain in unspecified joint: Secondary | ICD-10-CM | POA: Diagnosis not present

## 2017-11-26 DIAGNOSIS — I10 Essential (primary) hypertension: Secondary | ICD-10-CM | POA: Diagnosis not present

## 2017-11-26 DIAGNOSIS — M791 Myalgia, unspecified site: Secondary | ICD-10-CM | POA: Diagnosis not present

## 2017-11-26 DIAGNOSIS — R6889 Other general symptoms and signs: Secondary | ICD-10-CM | POA: Diagnosis not present

## 2017-11-26 DIAGNOSIS — M2550 Pain in unspecified joint: Secondary | ICD-10-CM | POA: Diagnosis not present

## 2017-11-26 DIAGNOSIS — E559 Vitamin D deficiency, unspecified: Secondary | ICD-10-CM | POA: Diagnosis not present

## 2017-11-26 DIAGNOSIS — L659 Nonscarring hair loss, unspecified: Secondary | ICD-10-CM | POA: Diagnosis not present

## 2017-11-26 DIAGNOSIS — R5383 Other fatigue: Secondary | ICD-10-CM | POA: Diagnosis not present

## 2017-12-09 ENCOUNTER — Other Ambulatory Visit: Payer: Self-pay | Admitting: Family Medicine

## 2017-12-09 DIAGNOSIS — N6019 Diffuse cystic mastopathy of unspecified breast: Secondary | ICD-10-CM

## 2017-12-29 DIAGNOSIS — I10 Essential (primary) hypertension: Secondary | ICD-10-CM | POA: Diagnosis not present

## 2018-01-04 ENCOUNTER — Ambulatory Visit
Admission: RE | Admit: 2018-01-04 | Discharge: 2018-01-04 | Disposition: A | Discharge status: Home / Self Care | Payer: Medicare HMO | Source: Ambulatory Visit | Attending: Family Medicine | Admitting: Family Medicine

## 2018-01-04 DIAGNOSIS — R928 Other abnormal and inconclusive findings on diagnostic imaging of breast: Secondary | ICD-10-CM | POA: Diagnosis not present

## 2018-01-04 DIAGNOSIS — N6019 Diffuse cystic mastopathy of unspecified breast: Secondary | ICD-10-CM

## 2018-01-04 DIAGNOSIS — N6310 Unspecified lump in the right breast, unspecified quadrant: Secondary | ICD-10-CM | POA: Diagnosis not present

## 2018-01-27 DIAGNOSIS — I10 Essential (primary) hypertension: Secondary | ICD-10-CM | POA: Diagnosis not present

## 2018-06-21 DIAGNOSIS — Z9114 Patient's other noncompliance with medication regimen: Secondary | ICD-10-CM | POA: Diagnosis not present

## 2018-06-21 DIAGNOSIS — M797 Fibromyalgia: Secondary | ICD-10-CM | POA: Diagnosis not present

## 2018-06-21 DIAGNOSIS — H8110 Benign paroxysmal vertigo, unspecified ear: Secondary | ICD-10-CM | POA: Diagnosis not present

## 2018-06-21 DIAGNOSIS — K219 Gastro-esophageal reflux disease without esophagitis: Secondary | ICD-10-CM | POA: Diagnosis not present

## 2018-06-21 DIAGNOSIS — I10 Essential (primary) hypertension: Secondary | ICD-10-CM | POA: Diagnosis not present

## 2018-08-30 DIAGNOSIS — J0100 Acute maxillary sinusitis, unspecified: Secondary | ICD-10-CM | POA: Diagnosis not present

## 2018-08-30 DIAGNOSIS — J069 Acute upper respiratory infection, unspecified: Secondary | ICD-10-CM | POA: Diagnosis not present

## 2018-09-29 DIAGNOSIS — M797 Fibromyalgia: Secondary | ICD-10-CM | POA: Diagnosis not present

## 2018-09-29 DIAGNOSIS — Z0000 Encounter for general adult medical examination without abnormal findings: Secondary | ICD-10-CM | POA: Diagnosis not present

## 2018-09-29 DIAGNOSIS — F322 Major depressive disorder, single episode, severe without psychotic features: Secondary | ICD-10-CM | POA: Diagnosis not present

## 2018-09-29 DIAGNOSIS — Z131 Encounter for screening for diabetes mellitus: Secondary | ICD-10-CM | POA: Diagnosis not present

## 2018-09-29 DIAGNOSIS — G894 Chronic pain syndrome: Secondary | ICD-10-CM | POA: Diagnosis not present

## 2018-09-29 DIAGNOSIS — Z6841 Body Mass Index (BMI) 40.0 and over, adult: Secondary | ICD-10-CM | POA: Diagnosis not present

## 2018-09-29 DIAGNOSIS — I10 Essential (primary) hypertension: Secondary | ICD-10-CM | POA: Diagnosis not present

## 2018-10-05 DIAGNOSIS — Z131 Encounter for screening for diabetes mellitus: Secondary | ICD-10-CM | POA: Diagnosis not present

## 2018-10-05 DIAGNOSIS — Z0000 Encounter for general adult medical examination without abnormal findings: Secondary | ICD-10-CM | POA: Diagnosis not present

## 2018-10-05 DIAGNOSIS — R7309 Other abnormal glucose: Secondary | ICD-10-CM | POA: Diagnosis not present

## 2018-10-05 DIAGNOSIS — F322 Major depressive disorder, single episode, severe without psychotic features: Secondary | ICD-10-CM | POA: Diagnosis not present

## 2018-10-05 DIAGNOSIS — M797 Fibromyalgia: Secondary | ICD-10-CM | POA: Diagnosis not present

## 2018-10-05 DIAGNOSIS — G894 Chronic pain syndrome: Secondary | ICD-10-CM | POA: Diagnosis not present

## 2018-10-05 DIAGNOSIS — I10 Essential (primary) hypertension: Secondary | ICD-10-CM | POA: Diagnosis not present

## 2018-10-05 DIAGNOSIS — Z6841 Body Mass Index (BMI) 40.0 and over, adult: Secondary | ICD-10-CM | POA: Diagnosis not present

## 2019-03-04 ENCOUNTER — Encounter (HOSPITAL_COMMUNITY): Payer: Self-pay | Admitting: Emergency Medicine

## 2019-03-04 ENCOUNTER — Emergency Department (HOSPITAL_COMMUNITY): Payer: Medicare Other

## 2019-03-04 ENCOUNTER — Emergency Department (HOSPITAL_COMMUNITY)
Admission: EM | Admit: 2019-03-04 | Discharge: 2019-03-04 | Disposition: A | Discharge status: Home / Self Care | Payer: Medicare Other | Attending: Emergency Medicine | Admitting: Emergency Medicine

## 2019-03-04 ENCOUNTER — Other Ambulatory Visit: Payer: Self-pay

## 2019-03-04 DIAGNOSIS — F17210 Nicotine dependence, cigarettes, uncomplicated: Secondary | ICD-10-CM | POA: Diagnosis not present

## 2019-03-04 DIAGNOSIS — I10 Essential (primary) hypertension: Secondary | ICD-10-CM | POA: Insufficient documentation

## 2019-03-04 DIAGNOSIS — Z20828 Contact with and (suspected) exposure to other viral communicable diseases: Secondary | ICD-10-CM | POA: Insufficient documentation

## 2019-03-04 DIAGNOSIS — G43009 Migraine without aura, not intractable, without status migrainosus: Secondary | ICD-10-CM

## 2019-03-04 DIAGNOSIS — R51 Headache: Secondary | ICD-10-CM | POA: Diagnosis present

## 2019-03-04 DIAGNOSIS — Z79899 Other long term (current) drug therapy: Secondary | ICD-10-CM | POA: Insufficient documentation

## 2019-03-04 MED ORDER — DEXAMETHASONE 4 MG PO TABS
10.0000 mg | ORAL_TABLET | Freq: Once | ORAL | Status: AC
  Administered 2019-03-04: 10 mg via ORAL
  Filled 2019-03-04: qty 2

## 2019-03-04 MED ORDER — DIPHENHYDRAMINE HCL 25 MG PO CAPS
25.0000 mg | ORAL_CAPSULE | Freq: Once | ORAL | Status: AC
  Administered 2019-03-04: 25 mg via ORAL
  Filled 2019-03-04: qty 1

## 2019-03-04 MED ORDER — PROCHLORPERAZINE MALEATE 10 MG PO TABS
10.0000 mg | ORAL_TABLET | Freq: Once | ORAL | Status: AC
  Administered 2019-03-04: 10 mg via ORAL
  Filled 2019-03-04: qty 1

## 2019-03-04 NOTE — ED Triage Notes (Signed)
Per pt, states headache and left ear and throat pain for 3 days-denies fever

## 2019-03-04 NOTE — ED Provider Notes (Signed)
[facility] DEPT Provider Note   CSN: [id number] Arrival date & time: 03/04/19  1401    History   Chief Complaint Chief Complaint  Patient presents with  . Headache  . Otalgia    HPI Shannon [name] is a 63 y.o. female.     The history is provided by the patient.  Migraine This is a new problem. The current episode started yesterday. The problem occurs constantly. The problem has not changed since onset.Associated symptoms include headaches. Pertinent negatives include no chest pain, no abdominal pain and no shortness of breath. Nothing aggravates the symptoms. Nothing relieves the symptoms. The treatment provided no relief.    Past Medical History:  Diagnosis Date  . Herniated disc   . Hypertension   . Sciatica     Patient Active Problem List   Diagnosis Date Noted  . Appendicitis 10/28/2017  . Cigarette smoker 07/26/2015  . Dyspnea 05/29/2015  . BACK PAIN, LUMBAR 05/29/2008  . SARCOIDOSIS 12/21/2007  . FATIGUE, CHRONIC 12/21/2007  . RECTAL BLEEDING 12/14/2007  . NECK PAIN, ACUTE 12/14/2007  . ABDOMINAL PAIN, GENERALIZED 12/14/2007  . DEPRESSION 05/13/2007  . Essential hypertension 05/13/2007  . 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 HEMATURIA 05/13/2007  . ALLERGY 05/13/2007  . Morbid obesity ([place]) 05/07/2007    Past Surgical History:  Procedure Laterality Date  . ABDOMINAL HYSTERECTOMY    . BREAST BIOPSY Right 12/2016  . LAPAROSCOPIC APPENDECTOMY N/A 10/28/2017   Procedure: APPENDECTOMY LAPAROSCOPIC;  Surgeon: Leighton Ruff, MD;  Location: [facility];  Service: General;  Laterality: N/A;     OB History   No obstetric history on file.      Home Medications    Prior to Admission medications   Medication Sig Start Date End Date Taking? Authorizing Provider  acetaminophen (TYLENOL) 500 MG tablet Take 2 tablets (1,000 mg total) by mouth every 6 (six) hours as needed for moderate pain. DO NOT TAKE MORE THAN 4000 MG OF TYLENOL PER  DAY.  IT CAN HARM YOUR LIVER.  TYLENOL (ACETAMINOPHEN) IS ALSO IN YOUR PRESCRIPTION PAIN MEDICATION.  YOU HAVE TO COUNT IT IN YOUR DAILY TOTAL. 10/29/17   Earnstine Regal, PA-C  albuterol (VENTOLIN HFA) 108 (90 BASE) MCG/ACT inhaler Inhale 2 puffs into the lungs every 6 (six) hours as needed for wheezing or shortness of breath.    [provider]  BLACK CURRANT SEED OIL PO Take 10 mLs by mouth daily.    [provider]  CHANTIX STARTING MONTH PAK 0.5 MG X 11 & 1 MG X 42 tablet Take 0.5-1 mg by mouth 2 (two) times daily. Take 0.5 mg tablet BID for 7 Days Then Take 1 mg tablet BID for 21 Days. 09/28/17   [provider]  DULoxetine (CYMBALTA) 20 MG capsule Take 20 mg by mouth 2 (two) times daily.  09/21/17   [provider]  gabapentin (NEURONTIN) 300 MG capsule Take 300 mg by mouth at bedtime.    [provider]  HYDROcodone-acetaminophen (NORCO/VICODIN) 5-325 MG tablet Take 1-2 tablets by mouth every 4 (four) hours as needed for moderate pain. 10/29/17   Earnstine Regal, PA-C  losartan-hydrochlorothiazide (HYZAAR) 100-12.5 MG tablet Take 1 tablet by mouth daily.  09/11/16   [provider]  methocarbamol (ROBAXIN) 500 MG tablet Take 1 tablet (500 mg total) by mouth at bedtime and may repeat dose one time if needed. 06/07/17   Recardo Evangelist, PA-C  naproxen sodium (ANAPROX) 220 MG tablet Take 660 mg by mouth  2 (two) times daily as needed (pain).     [provider]  pantoprazole (PROTONIX) 20 MG tablet Take 1 tablet (20 mg total) by mouth daily. 11/18/16   Charlesetta Shanks, MD  Probiotic Product (PROBIOTIC PO) Take 1 capsule by mouth daily.    [provider]  Psyllium (FIBER) 0.52 g CAPS Take 1 capsule by mouth daily.    [provider]  rOPINIRole (REQUIP) 0.5 MG tablet Take 1 tablet by mouth at bedtime. 06/03/15   [provider]    Family History Family History  Problem Relation Age of Onset  . Hypertension  Mother   . Diabetes Father     Social History Social History   Tobacco Use  . Smoking status: Current Some Day Smoker    Packs/day: 0.50    Years: 46.00    Pack years: 23.00    Types: Cigarettes    Last attempt to quit: 04/02/2015    Years since quitting: 3.9  . Smokeless tobacco: Never Used  Substance Use Topics  . Alcohol use: No    Alcohol/week: 0.0 standard drinks  . Drug use: No     Allergies   Propoxyphene n-acetaminophen and Prednisone   Review of Systems Review of Systems  Constitutional: Negative for chills and fever.  HENT: Positive for ear pain, sinus pain and sore throat.   Eyes: Negative for pain and visual disturbance.  Respiratory: Negative for cough and shortness of breath.   Cardiovascular: Negative for chest pain and palpitations.  Gastrointestinal: Negative for abdominal pain, nausea and vomiting.  Genitourinary: Negative for dysuria and hematuria.  Musculoskeletal: Negative for arthralgias and back pain.  Skin: Negative for color change and rash.  Neurological: Positive for headaches. Negative for seizures and syncope.  All other systems reviewed and are negative.    Physical Exam Updated Vital Signs  ED Triage Vitals  Enc Vitals Group     BP 03/04/19 1412 (!) 161/100     Pulse Rate 03/04/19 1412 86     Resp 03/04/19 1412 18     Temp 03/04/19 1412 98.4 F (36.9 C)     Temp Source 03/04/19 1412 Oral     SpO2 03/04/19 1412 96 %     Weight --      Height --      Head Circumference --      Peak Flow --      Pain Score 03/04/19 1417 8     Pain Loc --      Pain Edu? --      Excl. in [place]? --     Physical Exam Vitals signs and nursing note reviewed.  Constitutional:      General: She is not in acute distress.    Appearance: She is well-developed. She is not ill-appearing.  HENT:     Head: Normocephalic and atraumatic.  Eyes:     General: No visual field deficit.    Extraocular Movements: Extraocular movements intact.      Conjunctiva/sclera: Conjunctivae normal.     Pupils: Pupils are equal, round, and reactive to light.  Neck:     Musculoskeletal: Normal range of motion and neck supple.  Cardiovascular:     Rate and Rhythm: Normal rate and regular rhythm.     Heart sounds: Normal heart sounds. No murmur.  Pulmonary:     Effort: Pulmonary effort is normal. No respiratory distress.     Breath sounds: Normal breath sounds.  Abdominal:     Palpations: Abdomen  is soft.     Tenderness: There is no abdominal tenderness.  Musculoskeletal: Normal range of motion.  Skin:    General: Skin is warm and dry.     Capillary Refill: Capillary refill takes less than 2 seconds.  Neurological:     Mental Status: She is alert and oriented to person, place, and time.     Cranial Nerves: No cranial nerve deficit, dysarthria or facial asymmetry.     Sensory: No sensory deficit.     Motor: No weakness.     Coordination: Coordination normal.     Gait: Gait normal.  Psychiatric:        Mood and Affect: Mood normal.      ED Treatments / Results  Labs (all labs ordered are listed, but only abnormal results are displayed) Labs Reviewed  NOVEL CORONAVIRUS, NAA (HOSPITAL ORDER, SEND-OUT TO REF LAB)    EKG None  Radiology Ct Head Wo Contrast  Result Date: 03/04/2019 CLINICAL DATA:  Patient with headache and left ear/throat pain. EXAM: CT HEAD WITHOUT CONTRAST TECHNIQUE: Contiguous axial images were obtained from the base of the skull through the vertex without intravenous contrast. COMPARISON:  CT cervical spine 01/01/2014 FINDINGS: Brain: Ventricles and sulci are appropriate for patient's age. No evidence for acute cortically based infarct, intracranial hemorrhage, mass lesion or mass-effect. Vascular: Unremarkable Skull: Intact. Sinuses/Orbits: Paranasal sinuses are well aerated. Mastoid air cells are unremarkable. Orbits are unremarkable. Other: Unremarkable. IMPRESSION: No acute intracranial process. Electronically  Signed   By: Lovey Newcomer M.D.   On: 03/04/2019 16:56    Procedures Procedures (including critical care time)  Medications Ordered in ED Medications  prochlorperazine (COMPAZINE) tablet 10 mg (10 mg Oral Given 03/04/19 1533)  diphenhydrAMINE (BENADRYL) capsule 25 mg (25 mg Oral Given 03/04/19 1533)  dexamethasone (DECADRON) tablet 10 mg (10 mg Oral Given 03/04/19 1533)     Initial Impression / Assessment and Plan / ED Course  I have reviewed the triage vital signs and the nursing notes.  Pertinent labs & imaging results that were available during my care of the patient were reviewed by me and considered in my medical decision making (see chart for details).     Shannon [name] is a 63 year old female who presents to the ED with ear pain, headache.  History of migraines.  Normal vitals.  No fever.  No signs of ear or throat infection on exam.  Neurologically intact.  CT scan of the head showed no bleeding.  Suspect migraine type headache.  Possibly sinus headache.  Patient felt better after Compazine, Benadryl, Decadron.  Will swab for coronavirus.  Given self-isolation instructions at home.  Recommend continue Tylenol/Motrin.  Discharged from ED in good condition.  Given return precautions.  This chart was dictated using voice recognition software.  Despite best efforts to proofread,  errors can occur which can change the documentation meaning.  Shannon [name] was evaluated in Emergency Department on 03/04/2019 for the symptoms described in the history of present illness. She was evaluated in the context of the global COVID-19 pandemic, which necessitated consideration that the patient might be at risk for infection with the SARS-CoV-2 virus that causes COVID-19. Institutional protocols and algorithms that pertain to the evaluation of patients at risk for COVID-19 are in a state of rapid change based on information released by regulatory bodies including the CDC and federal and state organizations.  These policies and algorithms were followed during the patient's care in the ED.     Final Clinical  Impressions(s) / ED Diagnoses   Final diagnoses:  Migraine without aura and without status migrainosus, not intractable    ED Discharge Orders    None       Lennice Sites, DO 03/04/19 1705

## 2019-03-04 NOTE — Discharge Instructions (Signed)
Head CT is normal today.  Continue Tylenol Motrin for headache.  Self-isolation until you hear back from the coronavirus test.

## 2019-03-05 LAB — NOVEL CORONAVIRUS, NAA (HOSP ORDER, SEND-OUT TO REF LAB; TAT 18-24 HRS): SARS-CoV-2, NAA: NOT DETECTED

## 2019-03-23 IMAGING — CT CT ABD-PELV W/ CM
2 of 5 series · 16 of 46 positions shown, 18 images · IV contrast (ISOVUE)
Comparison: 11/18/2016, 11/10/2016, 02/21/2016 CT

CLINICAL DATA: Mid epigastric pain radiating to the right flank,
nausea

EXAM:
CT ABDOMEN AND PELVIS WITH CONTRAST
TECHNIQUE: Multidetector CT imaging of the abdomen and pelvis was performed
using the standard protocol following bolus administration of
intravenous contrast.
CONTRAST:  100mL P2I188-2CC IOPAMIDOL (P2I188-2CC) INJECTION 61%

[Series 2: axial st · axial · 0.72mm/px · z∈[-556,-156]mm · 13 of 92 slices shown, 15 images]
[im 6/92  soft-tissue]
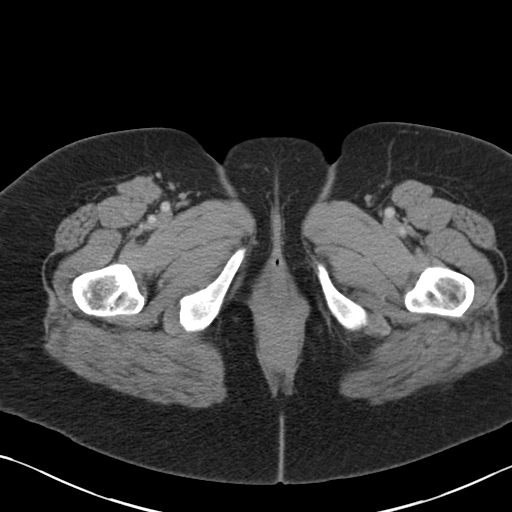
[im 6/92  bone]
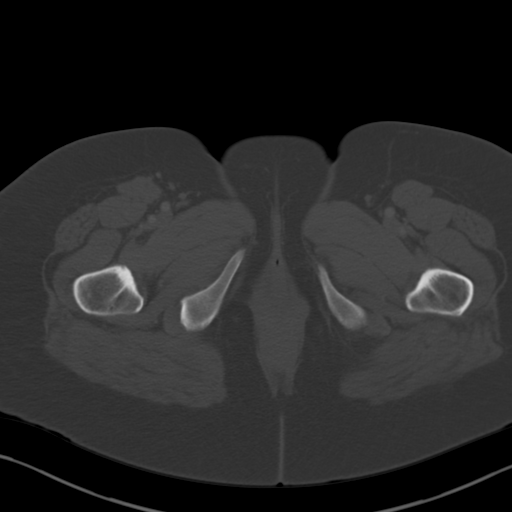
[im 12/92  soft-tissue]
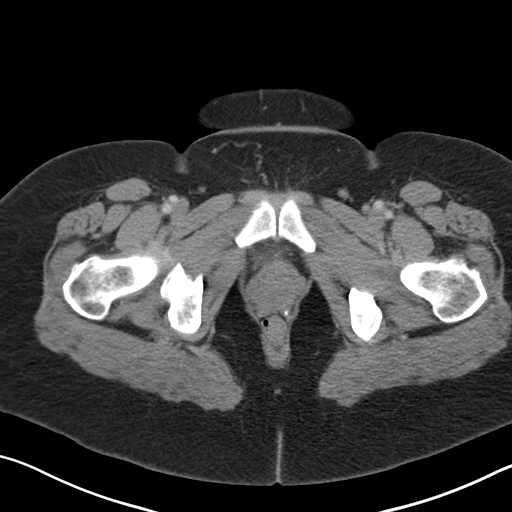
[im 18/92  soft-tissue]
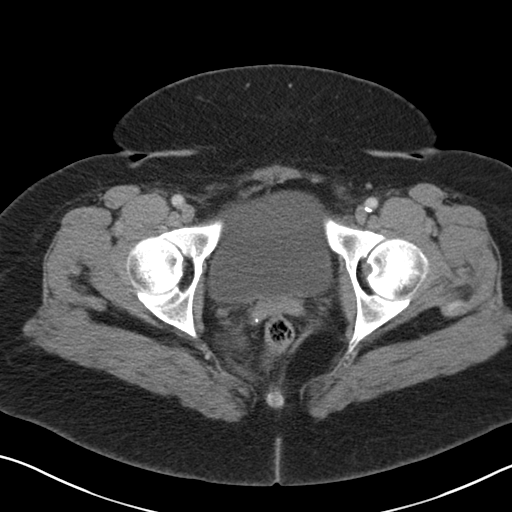
[im 29/92  soft-tissue]
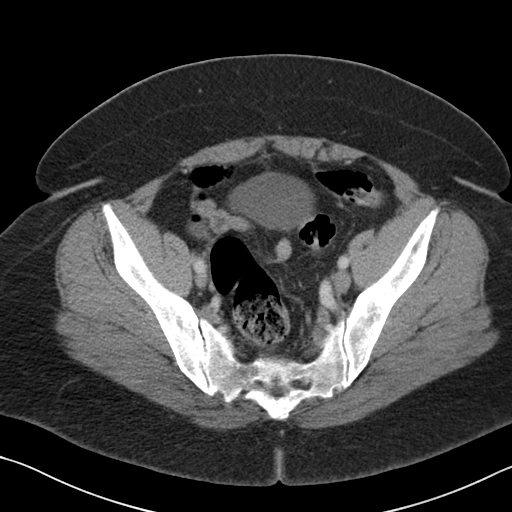
[im 35/92  soft-tissue]
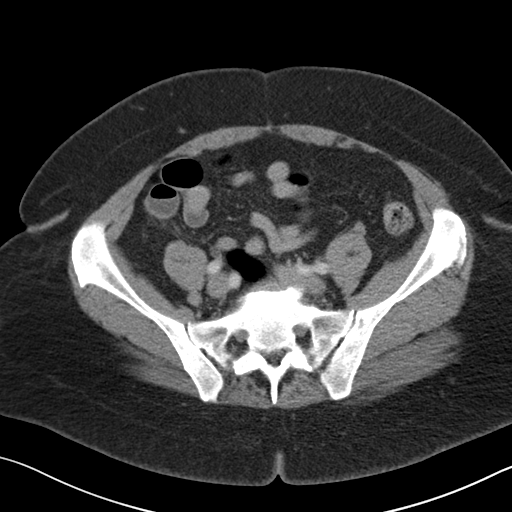
[im 40/92  soft-tissue]
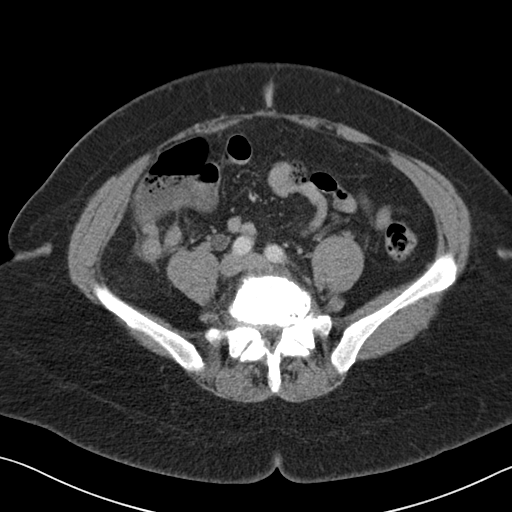
[im 46/92  soft-tissue]
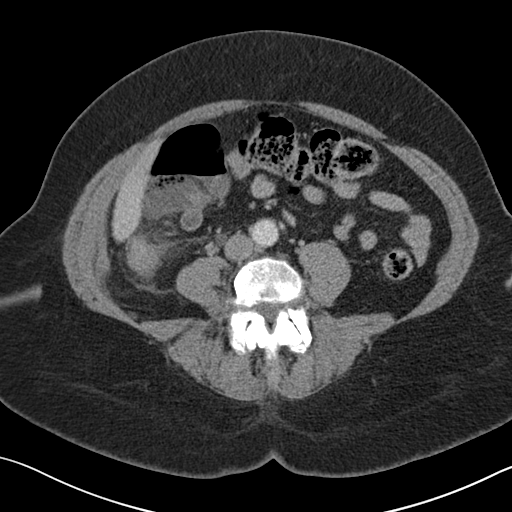
[im 52/92  soft-tissue]
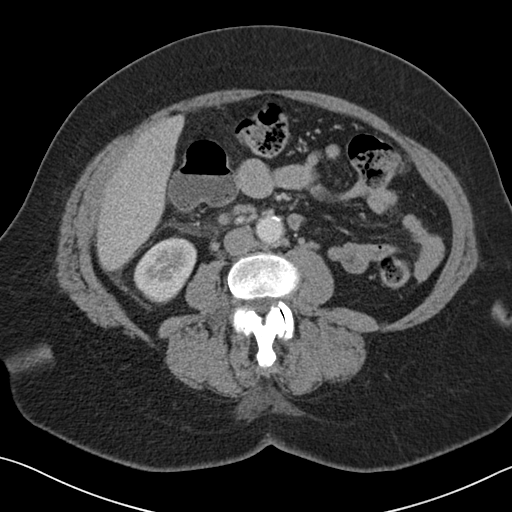
[im 57/92  soft-tissue]
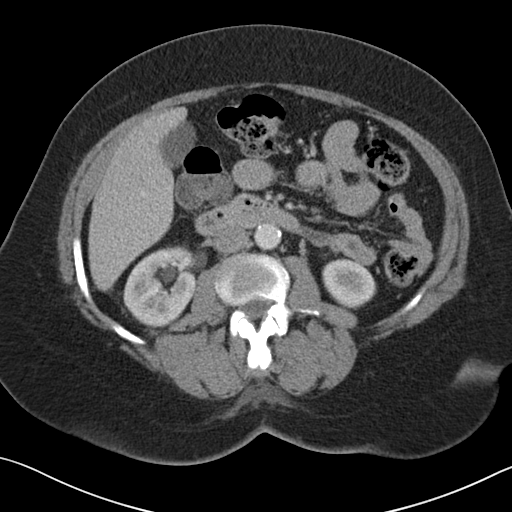
[im 57/92  bone]
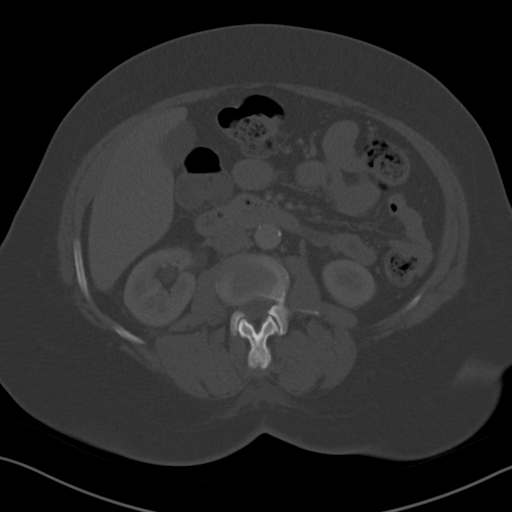
[im 63/92  soft-tissue]
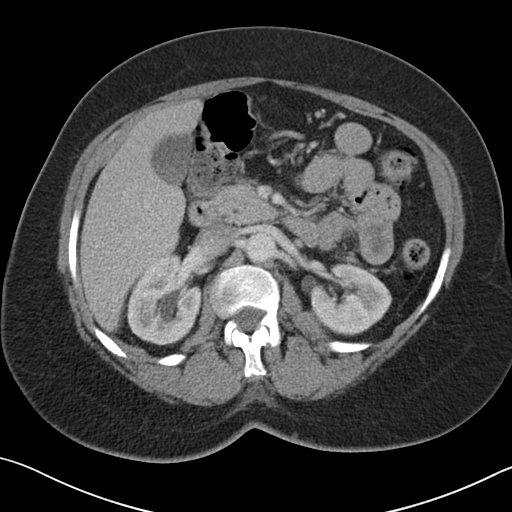
[im 74/92  soft-tissue]
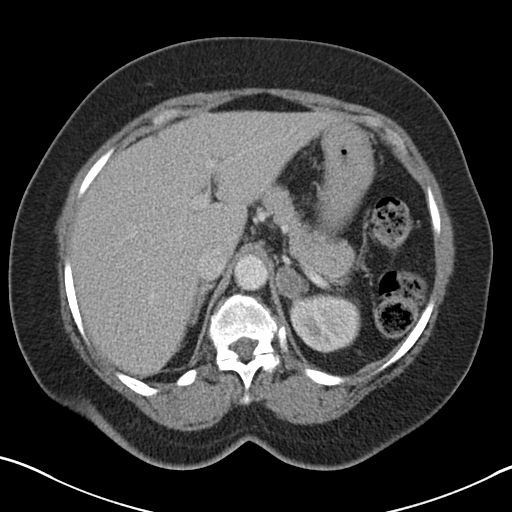
[im 80/92  soft-tissue]
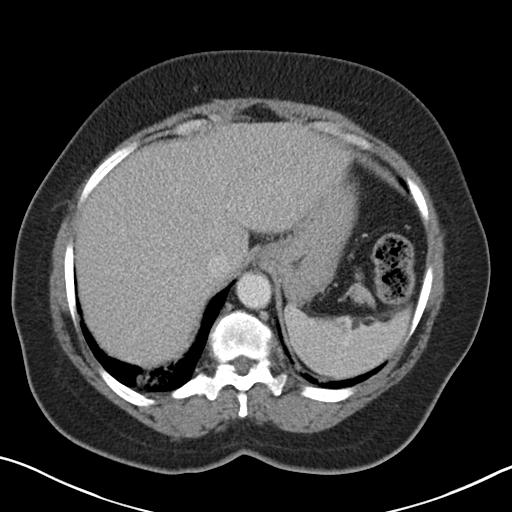
[im 86/92  soft-tissue]
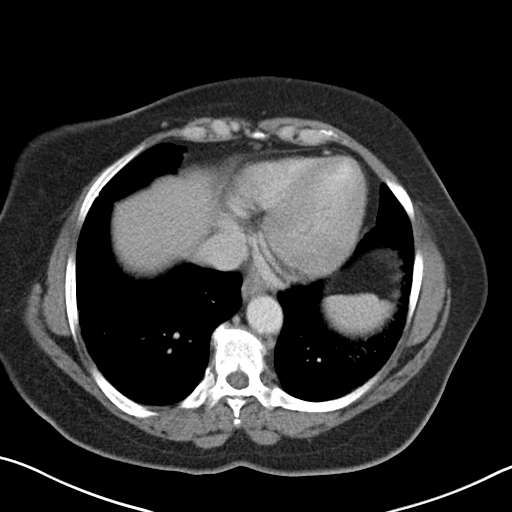

[Series 5: coronal st · coronal · 0.85mm/px · 3 of 117 slices shown]
[im 39/117  soft-tissue]
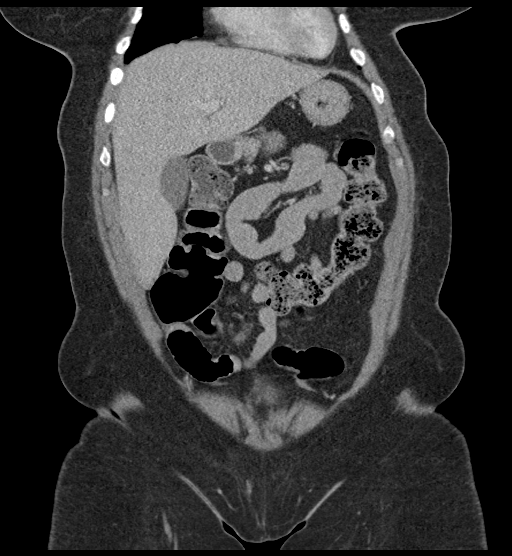
[im 52/117  soft-tissue]
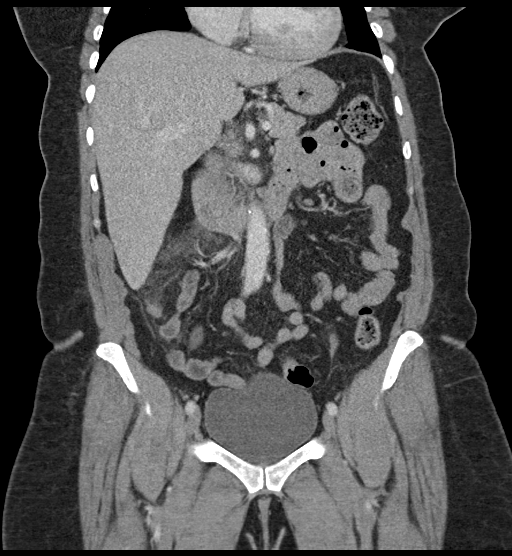
[im 65/117  soft-tissue]
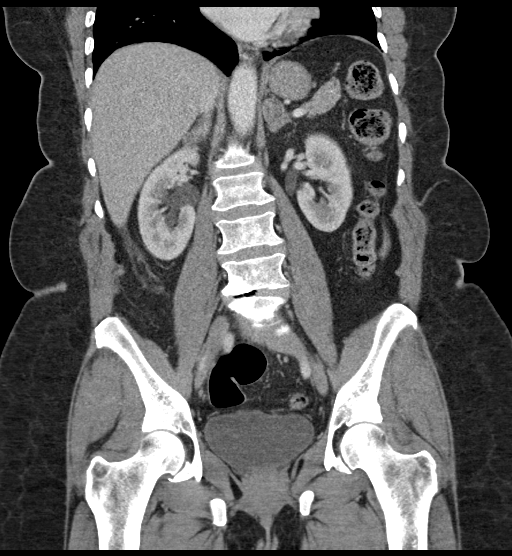

[16 of 46 positions shown; findings below may reference images not displayed]

FINDINGS: Lower chest: Lung bases show no acute consolidation or pleural
effusion. The heart size is borderline enlarged. Small right
posterior fat containing diaphragmatic hernia

Hepatobiliary: No focal liver abnormality is seen. No gallstones,
gallbladder wall thickening, or biliary dilatation.

Pancreas: Unremarkable. No pancreatic ductal dilatation or
surrounding inflammatory changes.

Spleen: Normal in size without focal abnormality.

Adrenals/Urinary Tract: Similar appearance of adrenal gland nodules
left greater than right. Mild right hydronephrosis and hydroureter,
but no visible stones along the course of the ureter. Left kidney
within normal limits. The bladder is unremarkable

Stomach/Bowel: The stomach is nonenlarged. No dilated small bowel.
Retrocecal appendix with ascending configuration and tip visible
near the inferior right hepatic lobe. Marked periappendiceal
inflammation. Luminal diameter up to 2 cm. Low-attenuation foci
within the lumen of the appendix. No calcified stones.

Vascular/Lymphatic: Mild aortic atherosclerosis. No aneurysmal
dilatation. No significantly enlarged lymph nodes.

Reproductive: Status post hysterectomy. No adnexal masses.

Other: Negative for free air or free fluid

Musculoskeletal: Grade 1 anterolisthesis of L4 on L5. Degenerative
changes at L4-L5 and L5-S1. No acute or suspicious lesion
IMPRESSION: 1. Findings consistent with acute appendicitis. Given marked luminal
diameter up to 2 cm and the presence of low attenuating foci within
the lumen of the inflamed appendix, consider possible appendicitis
with mucocele.

Appendix: Location: Retrocecal with ascending configuration, tip
position beneath the inferior tip of the right lobe of liver

Diameter: 2 cm

Appendicolith: Not seen

Mucosal hyper-enhancement: Not seen

Extraluminal gas: Not seen

Periappendiceal collection: Moderate surrounding inflammation but no
discrete focal fluid collection

2. Mild right hydronephrosis and hydroureter, but no stones along
the course of the right ureter.

## 2019-05-24 DIAGNOSIS — R69 Illness, unspecified: Secondary | ICD-10-CM | POA: Diagnosis not present

## 2019-05-24 DIAGNOSIS — R42 Dizziness and giddiness: Secondary | ICD-10-CM | POA: Diagnosis not present

## 2019-05-24 DIAGNOSIS — E119 Type 2 diabetes mellitus without complications: Secondary | ICD-10-CM | POA: Diagnosis not present

## 2019-05-30 DIAGNOSIS — R69 Illness, unspecified: Secondary | ICD-10-CM | POA: Diagnosis not present

## 2019-05-30 DIAGNOSIS — H8110 Benign paroxysmal vertigo, unspecified ear: Secondary | ICD-10-CM | POA: Diagnosis not present

## 2019-06-13 DIAGNOSIS — E1169 Type 2 diabetes mellitus with other specified complication: Secondary | ICD-10-CM | POA: Diagnosis not present

## 2019-06-15 DIAGNOSIS — M797 Fibromyalgia: Secondary | ICD-10-CM | POA: Diagnosis not present

## 2019-06-15 DIAGNOSIS — E1169 Type 2 diabetes mellitus with other specified complication: Secondary | ICD-10-CM | POA: Diagnosis not present

## 2019-06-15 DIAGNOSIS — R69 Illness, unspecified: Secondary | ICD-10-CM | POA: Diagnosis not present

## 2019-06-15 DIAGNOSIS — E559 Vitamin D deficiency, unspecified: Secondary | ICD-10-CM | POA: Diagnosis not present

## 2019-06-15 DIAGNOSIS — I10 Essential (primary) hypertension: Secondary | ICD-10-CM | POA: Diagnosis not present

## 2019-06-30 DIAGNOSIS — F4321 Adjustment disorder with depressed mood: Secondary | ICD-10-CM | POA: Diagnosis not present

## 2019-06-30 DIAGNOSIS — I10 Essential (primary) hypertension: Secondary | ICD-10-CM | POA: Diagnosis not present

## 2019-06-30 DIAGNOSIS — E1169 Type 2 diabetes mellitus with other specified complication: Secondary | ICD-10-CM | POA: Diagnosis not present

## 2019-06-30 DIAGNOSIS — R69 Illness, unspecified: Secondary | ICD-10-CM | POA: Diagnosis not present

## 2019-08-30 ENCOUNTER — Encounter: Payer: Medicare HMO | Attending: Family Medicine | Admitting: Dietician

## 2019-08-30 ENCOUNTER — Encounter: Payer: Self-pay | Admitting: Dietician

## 2019-08-30 ENCOUNTER — Other Ambulatory Visit: Payer: Self-pay

## 2019-08-30 DIAGNOSIS — E119 Type 2 diabetes mellitus without complications: Secondary | ICD-10-CM | POA: Diagnosis not present

## 2019-08-30 NOTE — Progress Notes (Signed)
Patient was seen on 08/30/2019 for the first of a series of three diabetes self-management courses at the [facility].  Patient Education Plan per assessed needs and concerns is to attend three course education program for Diabetes Self Management Education.  The following learning objectives were met by the patient during this class:  Describe diabetes, types of diabetes and pathophysiology  State some common risk factors for diabetes  Defines the role of glucose and insulin  Describe the relationship between diabetes and cardiovascular and other risks  State the members of the Healthcare Team  States the rationale for glucose monitoring and when to test  State their individual Target Range  State the importance of logging glucose readings and how to interpret the readings  Identifies A1C target  Explain the correlation between A1c and eAG values  State symptoms and treatment of high blood glucose and low blood glucose  Explain proper technique for glucose testing and identify proper sharps disposal  Handouts given during class include:  How to Thrive:  A Guide for Your Journey with Diabetes by the ADA  Meal Plan Card and carbohydrate content list  Dietary intake form  Low Sodium Flavoring Tips  Types of Fats  Dining Out  Label reading  Snack list  Planning a balanced meal  The diabetes portion plate  Diabetes Resources  A1c to eAG Conversion Chart  Blood Glucose Log  Diabetes Recommended Care Schedule  Support Group  Diabetes Success Plan  Core Class Satisfaction Survey   Follow-Up Plan:  Attend core 2   

## 2019-09-01 DIAGNOSIS — R69 Illness, unspecified: Secondary | ICD-10-CM | POA: Diagnosis not present

## 2019-09-01 DIAGNOSIS — E1169 Type 2 diabetes mellitus with other specified complication: Secondary | ICD-10-CM | POA: Diagnosis not present

## 2019-09-01 DIAGNOSIS — I10 Essential (primary) hypertension: Secondary | ICD-10-CM | POA: Diagnosis not present

## 2019-09-01 DIAGNOSIS — F4321 Adjustment disorder with depressed mood: Secondary | ICD-10-CM | POA: Diagnosis not present

## 2019-09-06 ENCOUNTER — Ambulatory Visit: Payer: Medicare Other

## 2019-09-13 ENCOUNTER — Ambulatory Visit: Payer: Medicare Other

## 2019-09-27 ENCOUNTER — Encounter: Payer: Self-pay | Admitting: Dietician

## 2019-09-27 ENCOUNTER — Other Ambulatory Visit: Payer: Self-pay

## 2019-09-27 ENCOUNTER — Encounter: Payer: Medicare HMO | Attending: Family Medicine | Admitting: Dietician

## 2019-09-27 DIAGNOSIS — E119 Type 2 diabetes mellitus without complications: Secondary | ICD-10-CM | POA: Insufficient documentation

## 2019-09-27 NOTE — Progress Notes (Signed)
Patient was seen on 09/26/2019 for the second of a series of three diabetes self-management courses at the [facility]. The following learning objectives were met by the patient during this class:   Describe the role of different macronutrients on glucose  Explain how carbohydrates affect blood glucose  State what foods contain the most carbohydrates  Demonstrate carbohydrate counting  Demonstrate how to read Nutrition Facts food label  Describe effects of various fats on heart health  Describe the importance of good nutrition for health and healthy eating strategies  Describe techniques for managing your shopping, cooking and meal planning  List strategies to follow meal plan when dining out  Describe the effects of alcohol on glucose and how to use it safely  Goals:  Follow Diabetes Meal Plan as instructed  Aim to spread carbs evenly throughout the day  Aim for 3 meals per day and snacks as needed Include lean protein foods to meals/snacks  Monitor glucose levels as instructed by your doctor   Follow-Up Plan:  Attend Core 3  Work towards following your personal food plan.   

## 2019-09-29 DIAGNOSIS — F4321 Adjustment disorder with depressed mood: Secondary | ICD-10-CM | POA: Diagnosis not present

## 2019-09-29 DIAGNOSIS — I10 Essential (primary) hypertension: Secondary | ICD-10-CM | POA: Diagnosis not present

## 2019-09-29 DIAGNOSIS — E1169 Type 2 diabetes mellitus with other specified complication: Secondary | ICD-10-CM | POA: Diagnosis not present

## 2019-09-29 DIAGNOSIS — M170 Bilateral primary osteoarthritis of knee: Secondary | ICD-10-CM | POA: Diagnosis not present

## 2019-10-03 DIAGNOSIS — S76312A Strain of muscle, fascia and tendon of the posterior muscle group at thigh level, left thigh, initial encounter: Secondary | ICD-10-CM | POA: Diagnosis not present

## 2019-10-03 DIAGNOSIS — F4321 Adjustment disorder with depressed mood: Secondary | ICD-10-CM | POA: Diagnosis not present

## 2019-10-03 DIAGNOSIS — G894 Chronic pain syndrome: Secondary | ICD-10-CM | POA: Diagnosis not present

## 2019-10-03 DIAGNOSIS — Z0000 Encounter for general adult medical examination without abnormal findings: Secondary | ICD-10-CM | POA: Diagnosis not present

## 2019-10-03 DIAGNOSIS — M797 Fibromyalgia: Secondary | ICD-10-CM | POA: Diagnosis not present

## 2019-10-03 DIAGNOSIS — M170 Bilateral primary osteoarthritis of knee: Secondary | ICD-10-CM | POA: Diagnosis not present

## 2019-10-03 DIAGNOSIS — I10 Essential (primary) hypertension: Secondary | ICD-10-CM | POA: Diagnosis not present

## 2019-10-03 DIAGNOSIS — E1169 Type 2 diabetes mellitus with other specified complication: Secondary | ICD-10-CM | POA: Diagnosis not present

## 2019-10-03 DIAGNOSIS — H8110 Benign paroxysmal vertigo, unspecified ear: Secondary | ICD-10-CM | POA: Diagnosis not present

## 2019-10-03 DIAGNOSIS — E559 Vitamin D deficiency, unspecified: Secondary | ICD-10-CM | POA: Diagnosis not present

## 2019-10-03 DIAGNOSIS — G2581 Restless legs syndrome: Secondary | ICD-10-CM | POA: Diagnosis not present

## 2019-10-04 ENCOUNTER — Encounter: Payer: Medicare HMO | Attending: Family Medicine | Admitting: Dietician

## 2019-10-04 ENCOUNTER — Encounter: Payer: Self-pay | Admitting: Dietician

## 2019-10-04 ENCOUNTER — Other Ambulatory Visit: Payer: Self-pay

## 2019-10-04 DIAGNOSIS — E119 Type 2 diabetes mellitus without complications: Secondary | ICD-10-CM | POA: Insufficient documentation

## 2019-10-04 NOTE — Progress Notes (Signed)
Patient was seen on 10/04/19 for the third of a series of three diabetes self-management courses at the [facility].   Shannon [name] the amount of activity recommended for healthy living . Describe activities suitable for individual needs . Identify ways to regularly incorporate activity into daily life . Identify barriers to activity and ways to over come these barriers  Identify diabetes medications being personally used and their primary action for lowering glucose and possible side effects . Describe role of stress on blood glucose and develop strategies to address psychosocial issues . Identify diabetes complications and ways to prevent them  Explain how to manage diabetes during illness . Evaluate success in meeting personal goal . Establish 2-3 goals that they will plan to diligently work on  Goals:   I will be active 10 minutes or more 3 times a week  I will eat less unhealthy fats by eating less fried foods  Your patient has identified these potential barriers to change:  Motivation Stress  Your patient has identified their diabetes self-care support plan as  On-line Resources    Plan:  Attend Support Group as desired

## 2019-10-05 ENCOUNTER — Other Ambulatory Visit: Payer: Self-pay | Admitting: Family Medicine

## 2019-10-05 DIAGNOSIS — Z1231 Encounter for screening mammogram for malignant neoplasm of breast: Secondary | ICD-10-CM

## 2019-10-05 DIAGNOSIS — M8580 Other specified disorders of bone density and structure, unspecified site: Secondary | ICD-10-CM

## 2019-10-11 ENCOUNTER — Other Ambulatory Visit: Payer: Self-pay | Admitting: Family Medicine

## 2019-10-11 DIAGNOSIS — N63 Unspecified lump in unspecified breast: Secondary | ICD-10-CM

## 2019-11-02 ENCOUNTER — Ambulatory Visit
Admission: RE | Admit: 2019-11-02 | Discharge: 2019-11-02 | Disposition: A | Discharge status: Home / Self Care | Payer: Medicare HMO | Source: Ambulatory Visit | Attending: Family Medicine | Admitting: Family Medicine

## 2019-11-02 ENCOUNTER — Other Ambulatory Visit: Payer: Self-pay

## 2019-11-02 DIAGNOSIS — N63 Unspecified lump in unspecified breast: Secondary | ICD-10-CM

## 2019-11-02 DIAGNOSIS — N6311 Unspecified lump in the right breast, upper outer quadrant: Secondary | ICD-10-CM | POA: Diagnosis not present

## 2019-11-02 DIAGNOSIS — R928 Other abnormal and inconclusive findings on diagnostic imaging of breast: Secondary | ICD-10-CM | POA: Diagnosis not present

## 2019-11-02 DIAGNOSIS — N6313 Unspecified lump in the right breast, lower outer quadrant: Secondary | ICD-10-CM | POA: Diagnosis not present

## 2019-12-26 DIAGNOSIS — R69 Illness, unspecified: Secondary | ICD-10-CM | POA: Diagnosis not present

## 2019-12-26 DIAGNOSIS — E1169 Type 2 diabetes mellitus with other specified complication: Secondary | ICD-10-CM | POA: Diagnosis not present

## 2019-12-26 DIAGNOSIS — M170 Bilateral primary osteoarthritis of knee: Secondary | ICD-10-CM | POA: Diagnosis not present

## 2019-12-26 DIAGNOSIS — I10 Essential (primary) hypertension: Secondary | ICD-10-CM | POA: Diagnosis not present

## 2019-12-28 ENCOUNTER — Ambulatory Visit
Admission: RE | Admit: 2019-12-28 | Discharge: 2019-12-28 | Disposition: A | Discharge status: Home / Self Care | Payer: Medicare HMO | Source: Ambulatory Visit | Attending: Family Medicine | Admitting: Family Medicine

## 2019-12-28 ENCOUNTER — Other Ambulatory Visit: Payer: Self-pay

## 2019-12-28 DIAGNOSIS — M8580 Other specified disorders of bone density and structure, unspecified site: Secondary | ICD-10-CM

## 2019-12-28 DIAGNOSIS — Z780 Asymptomatic menopausal state: Secondary | ICD-10-CM | POA: Diagnosis not present

## 2019-12-30 DIAGNOSIS — E1169 Type 2 diabetes mellitus with other specified complication: Secondary | ICD-10-CM | POA: Diagnosis not present

## 2019-12-30 DIAGNOSIS — I10 Essential (primary) hypertension: Secondary | ICD-10-CM | POA: Diagnosis not present

## 2019-12-30 DIAGNOSIS — R69 Illness, unspecified: Secondary | ICD-10-CM | POA: Diagnosis not present

## 2019-12-30 DIAGNOSIS — M170 Bilateral primary osteoarthritis of knee: Secondary | ICD-10-CM | POA: Diagnosis not present

## 2020-01-10 DIAGNOSIS — R69 Illness, unspecified: Secondary | ICD-10-CM | POA: Diagnosis not present

## 2020-01-10 DIAGNOSIS — M170 Bilateral primary osteoarthritis of knee: Secondary | ICD-10-CM | POA: Diagnosis not present

## 2020-01-10 DIAGNOSIS — E1169 Type 2 diabetes mellitus with other specified complication: Secondary | ICD-10-CM | POA: Diagnosis not present

## 2020-01-10 DIAGNOSIS — I10 Essential (primary) hypertension: Secondary | ICD-10-CM | POA: Diagnosis not present

## 2020-01-23 DIAGNOSIS — M549 Dorsalgia, unspecified: Secondary | ICD-10-CM | POA: Diagnosis not present

## 2020-01-30 DIAGNOSIS — M170 Bilateral primary osteoarthritis of knee: Secondary | ICD-10-CM | POA: Diagnosis not present

## 2020-01-30 DIAGNOSIS — R69 Illness, unspecified: Secondary | ICD-10-CM | POA: Diagnosis not present

## 2020-01-30 DIAGNOSIS — I10 Essential (primary) hypertension: Secondary | ICD-10-CM | POA: Diagnosis not present

## 2020-01-30 DIAGNOSIS — E1169 Type 2 diabetes mellitus with other specified complication: Secondary | ICD-10-CM | POA: Diagnosis not present

## 2020-02-07 DIAGNOSIS — M545 Low back pain: Secondary | ICD-10-CM | POA: Diagnosis not present

## 2020-02-07 DIAGNOSIS — M25561 Pain in right knee: Secondary | ICD-10-CM | POA: Diagnosis not present

## 2020-02-07 DIAGNOSIS — M25562 Pain in left knee: Secondary | ICD-10-CM | POA: Diagnosis not present

## 2020-02-07 DIAGNOSIS — M5416 Radiculopathy, lumbar region: Secondary | ICD-10-CM | POA: Diagnosis not present

## 2020-02-17 DIAGNOSIS — M25561 Pain in right knee: Secondary | ICD-10-CM | POA: Diagnosis not present

## 2020-02-17 DIAGNOSIS — M25562 Pain in left knee: Secondary | ICD-10-CM | POA: Diagnosis not present

## 2020-02-21 DIAGNOSIS — M25561 Pain in right knee: Secondary | ICD-10-CM | POA: Diagnosis not present

## 2020-02-21 DIAGNOSIS — M25562 Pain in left knee: Secondary | ICD-10-CM | POA: Diagnosis not present

## 2020-02-23 DIAGNOSIS — M5136 Other intervertebral disc degeneration, lumbar region: Secondary | ICD-10-CM | POA: Diagnosis not present

## 2020-02-29 DIAGNOSIS — R69 Illness, unspecified: Secondary | ICD-10-CM | POA: Diagnosis not present

## 2020-02-29 DIAGNOSIS — M170 Bilateral primary osteoarthritis of knee: Secondary | ICD-10-CM | POA: Diagnosis not present

## 2020-02-29 DIAGNOSIS — I10 Essential (primary) hypertension: Secondary | ICD-10-CM | POA: Diagnosis not present

## 2020-02-29 DIAGNOSIS — E1169 Type 2 diabetes mellitus with other specified complication: Secondary | ICD-10-CM | POA: Diagnosis not present

## 2020-03-01 DIAGNOSIS — M25561 Pain in right knee: Secondary | ICD-10-CM | POA: Diagnosis not present

## 2020-03-01 DIAGNOSIS — M25562 Pain in left knee: Secondary | ICD-10-CM | POA: Diagnosis not present

## 2020-03-08 DIAGNOSIS — R69 Illness, unspecified: Secondary | ICD-10-CM | POA: Diagnosis not present

## 2020-03-08 DIAGNOSIS — M25561 Pain in right knee: Secondary | ICD-10-CM | POA: Diagnosis not present

## 2020-03-08 DIAGNOSIS — M170 Bilateral primary osteoarthritis of knee: Secondary | ICD-10-CM | POA: Diagnosis not present

## 2020-03-08 DIAGNOSIS — M25562 Pain in left knee: Secondary | ICD-10-CM | POA: Diagnosis not present

## 2020-03-08 DIAGNOSIS — I10 Essential (primary) hypertension: Secondary | ICD-10-CM | POA: Diagnosis not present

## 2020-03-08 DIAGNOSIS — E1169 Type 2 diabetes mellitus with other specified complication: Secondary | ICD-10-CM | POA: Diagnosis not present

## 2020-03-14 DIAGNOSIS — M25561 Pain in right knee: Secondary | ICD-10-CM | POA: Diagnosis not present

## 2020-03-14 DIAGNOSIS — M25562 Pain in left knee: Secondary | ICD-10-CM | POA: Diagnosis not present

## 2020-03-19 DIAGNOSIS — M545 Low back pain: Secondary | ICD-10-CM | POA: Diagnosis not present

## 2020-03-19 DIAGNOSIS — M170 Bilateral primary osteoarthritis of knee: Secondary | ICD-10-CM | POA: Diagnosis not present

## 2020-03-28 DIAGNOSIS — M25562 Pain in left knee: Secondary | ICD-10-CM | POA: Diagnosis not present

## 2020-03-28 DIAGNOSIS — M25561 Pain in right knee: Secondary | ICD-10-CM | POA: Diagnosis not present

## 2020-03-31 DIAGNOSIS — M5416 Radiculopathy, lumbar region: Secondary | ICD-10-CM | POA: Diagnosis not present

## 2020-04-04 DIAGNOSIS — M25561 Pain in right knee: Secondary | ICD-10-CM | POA: Diagnosis not present

## 2020-04-04 DIAGNOSIS — M25562 Pain in left knee: Secondary | ICD-10-CM | POA: Diagnosis not present

## 2020-04-06 DIAGNOSIS — E1169 Type 2 diabetes mellitus with other specified complication: Secondary | ICD-10-CM | POA: Diagnosis not present

## 2020-04-06 DIAGNOSIS — I10 Essential (primary) hypertension: Secondary | ICD-10-CM | POA: Diagnosis not present

## 2020-04-06 DIAGNOSIS — M48062 Spinal stenosis, lumbar region with neurogenic claudication: Secondary | ICD-10-CM | POA: Diagnosis not present

## 2020-04-06 DIAGNOSIS — M5416 Radiculopathy, lumbar region: Secondary | ICD-10-CM | POA: Diagnosis not present

## 2020-04-06 DIAGNOSIS — R69 Illness, unspecified: Secondary | ICD-10-CM | POA: Diagnosis not present

## 2020-04-06 DIAGNOSIS — M170 Bilateral primary osteoarthritis of knee: Secondary | ICD-10-CM | POA: Diagnosis not present

## 2020-04-10 DIAGNOSIS — G894 Chronic pain syndrome: Secondary | ICD-10-CM | POA: Diagnosis not present

## 2020-04-10 DIAGNOSIS — G2581 Restless legs syndrome: Secondary | ICD-10-CM | POA: Diagnosis not present

## 2020-04-10 DIAGNOSIS — E1169 Type 2 diabetes mellitus with other specified complication: Secondary | ICD-10-CM | POA: Diagnosis not present

## 2020-04-10 DIAGNOSIS — H8110 Benign paroxysmal vertigo, unspecified ear: Secondary | ICD-10-CM | POA: Diagnosis not present

## 2020-04-10 DIAGNOSIS — I10 Essential (primary) hypertension: Secondary | ICD-10-CM | POA: Diagnosis not present

## 2020-04-10 DIAGNOSIS — Z6841 Body Mass Index (BMI) 40.0 and over, adult: Secondary | ICD-10-CM | POA: Diagnosis not present

## 2020-04-10 DIAGNOSIS — Z2821 Immunization not carried out because of patient refusal: Secondary | ICD-10-CM | POA: Diagnosis not present

## 2020-04-10 DIAGNOSIS — E559 Vitamin D deficiency, unspecified: Secondary | ICD-10-CM | POA: Diagnosis not present

## 2020-04-10 DIAGNOSIS — K219 Gastro-esophageal reflux disease without esophagitis: Secondary | ICD-10-CM | POA: Diagnosis not present

## 2020-04-11 DIAGNOSIS — M5416 Radiculopathy, lumbar region: Secondary | ICD-10-CM | POA: Diagnosis not present

## 2020-04-18 DIAGNOSIS — M25561 Pain in right knee: Secondary | ICD-10-CM | POA: Diagnosis not present

## 2020-04-18 DIAGNOSIS — M25562 Pain in left knee: Secondary | ICD-10-CM | POA: Diagnosis not present

## 2020-04-23 DIAGNOSIS — Z6841 Body Mass Index (BMI) 40.0 and over, adult: Secondary | ICD-10-CM | POA: Diagnosis not present

## 2020-04-23 DIAGNOSIS — M25562 Pain in left knee: Secondary | ICD-10-CM | POA: Diagnosis not present

## 2020-04-23 DIAGNOSIS — M1711 Unilateral primary osteoarthritis, right knee: Secondary | ICD-10-CM | POA: Diagnosis not present

## 2020-04-23 DIAGNOSIS — M1712 Unilateral primary osteoarthritis, left knee: Secondary | ICD-10-CM | POA: Diagnosis not present

## 2020-04-23 DIAGNOSIS — M25561 Pain in right knee: Secondary | ICD-10-CM | POA: Diagnosis not present

## 2020-04-25 DIAGNOSIS — H5213 Myopia, bilateral: Secondary | ICD-10-CM | POA: Diagnosis not present

## 2020-04-28 DIAGNOSIS — M5416 Radiculopathy, lumbar region: Secondary | ICD-10-CM | POA: Diagnosis not present

## 2020-04-28 DIAGNOSIS — H524 Presbyopia: Secondary | ICD-10-CM | POA: Diagnosis not present

## 2020-04-28 DIAGNOSIS — H52209 Unspecified astigmatism, unspecified eye: Secondary | ICD-10-CM | POA: Diagnosis not present

## 2020-04-28 DIAGNOSIS — H5213 Myopia, bilateral: Secondary | ICD-10-CM | POA: Diagnosis not present

## 2020-05-23 DIAGNOSIS — I10 Essential (primary) hypertension: Secondary | ICD-10-CM | POA: Diagnosis not present

## 2020-05-23 DIAGNOSIS — R69 Illness, unspecified: Secondary | ICD-10-CM | POA: Diagnosis not present

## 2020-05-23 DIAGNOSIS — M170 Bilateral primary osteoarthritis of knee: Secondary | ICD-10-CM | POA: Diagnosis not present

## 2020-05-23 DIAGNOSIS — E1169 Type 2 diabetes mellitus with other specified complication: Secondary | ICD-10-CM | POA: Diagnosis not present

## 2020-06-20 DIAGNOSIS — M722 Plantar fascial fibromatosis: Secondary | ICD-10-CM | POA: Diagnosis not present

## 2020-07-18 DIAGNOSIS — M170 Bilateral primary osteoarthritis of knee: Secondary | ICD-10-CM | POA: Diagnosis not present

## 2020-07-18 DIAGNOSIS — I10 Essential (primary) hypertension: Secondary | ICD-10-CM | POA: Diagnosis not present

## 2020-07-18 DIAGNOSIS — K219 Gastro-esophageal reflux disease without esophagitis: Secondary | ICD-10-CM | POA: Diagnosis not present

## 2020-07-18 DIAGNOSIS — R69 Illness, unspecified: Secondary | ICD-10-CM | POA: Diagnosis not present

## 2020-07-18 DIAGNOSIS — E1169 Type 2 diabetes mellitus with other specified complication: Secondary | ICD-10-CM | POA: Diagnosis not present

## 2020-08-28 DIAGNOSIS — Z6841 Body Mass Index (BMI) 40.0 and over, adult: Secondary | ICD-10-CM | POA: Diagnosis not present

## 2020-08-28 DIAGNOSIS — M48062 Spinal stenosis, lumbar region with neurogenic claudication: Secondary | ICD-10-CM | POA: Diagnosis not present

## 2020-08-28 DIAGNOSIS — M5416 Radiculopathy, lumbar region: Secondary | ICD-10-CM | POA: Diagnosis not present

## 2020-09-03 DIAGNOSIS — Z6841 Body Mass Index (BMI) 40.0 and over, adult: Secondary | ICD-10-CM | POA: Diagnosis not present

## 2020-09-03 DIAGNOSIS — M5416 Radiculopathy, lumbar region: Secondary | ICD-10-CM | POA: Diagnosis not present

## 2020-09-04 DIAGNOSIS — M48062 Spinal stenosis, lumbar region with neurogenic claudication: Secondary | ICD-10-CM | POA: Diagnosis not present

## 2020-09-04 DIAGNOSIS — M5136 Other intervertebral disc degeneration, lumbar region: Secondary | ICD-10-CM | POA: Diagnosis not present

## 2020-10-03 DIAGNOSIS — R69 Illness, unspecified: Secondary | ICD-10-CM | POA: Diagnosis not present

## 2020-10-03 DIAGNOSIS — Z0000 Encounter for general adult medical examination without abnormal findings: Secondary | ICD-10-CM | POA: Diagnosis not present

## 2020-10-03 DIAGNOSIS — I10 Essential (primary) hypertension: Secondary | ICD-10-CM | POA: Diagnosis not present

## 2020-10-03 DIAGNOSIS — Z6841 Body Mass Index (BMI) 40.0 and over, adult: Secondary | ICD-10-CM | POA: Diagnosis not present

## 2020-10-03 DIAGNOSIS — E1169 Type 2 diabetes mellitus with other specified complication: Secondary | ICD-10-CM | POA: Diagnosis not present

## 2020-10-03 DIAGNOSIS — Z23 Encounter for immunization: Secondary | ICD-10-CM | POA: Diagnosis not present

## 2020-10-03 DIAGNOSIS — M797 Fibromyalgia: Secondary | ICD-10-CM | POA: Diagnosis not present

## 2020-10-03 DIAGNOSIS — Z2821 Immunization not carried out because of patient refusal: Secondary | ICD-10-CM | POA: Diagnosis not present

## 2020-10-04 DIAGNOSIS — M5136 Other intervertebral disc degeneration, lumbar region: Secondary | ICD-10-CM | POA: Diagnosis not present

## 2020-10-04 DIAGNOSIS — M5416 Radiculopathy, lumbar region: Secondary | ICD-10-CM | POA: Diagnosis not present

## 2020-10-04 DIAGNOSIS — M48062 Spinal stenosis, lumbar region with neurogenic claudication: Secondary | ICD-10-CM | POA: Diagnosis not present

## 2020-10-25 DIAGNOSIS — M5416 Radiculopathy, lumbar region: Secondary | ICD-10-CM | POA: Diagnosis not present

## 2020-11-01 ENCOUNTER — Encounter (INDEPENDENT_AMBULATORY_CARE_PROVIDER_SITE_OTHER): Payer: Self-pay | Admitting: Family Medicine

## 2020-11-01 ENCOUNTER — Ambulatory Visit (INDEPENDENT_AMBULATORY_CARE_PROVIDER_SITE_OTHER): Payer: Medicare (Managed Care) | Admitting: Family Medicine

## 2020-11-01 ENCOUNTER — Other Ambulatory Visit: Payer: Self-pay

## 2020-11-01 VITALS — BP 142/84 | HR 79 | Temp 97.9°F | Ht 66.0 in | Wt 265.0 lb

## 2020-11-01 DIAGNOSIS — M797 Fibromyalgia: Secondary | ICD-10-CM

## 2020-11-01 DIAGNOSIS — E1169 Type 2 diabetes mellitus with other specified complication: Secondary | ICD-10-CM

## 2020-11-01 DIAGNOSIS — E559 Vitamin D deficiency, unspecified: Secondary | ICD-10-CM

## 2020-11-01 DIAGNOSIS — E65 Localized adiposity: Secondary | ICD-10-CM

## 2020-11-01 DIAGNOSIS — R5383 Other fatigue: Secondary | ICD-10-CM | POA: Diagnosis not present

## 2020-11-01 DIAGNOSIS — I152 Hypertension secondary to endocrine disorders: Secondary | ICD-10-CM

## 2020-11-01 DIAGNOSIS — F3289 Other specified depressive episodes: Secondary | ICD-10-CM | POA: Diagnosis not present

## 2020-11-01 DIAGNOSIS — G479 Sleep disorder, unspecified: Secondary | ICD-10-CM

## 2020-11-01 DIAGNOSIS — E1159 Type 2 diabetes mellitus with other circulatory complications: Secondary | ICD-10-CM

## 2020-11-01 DIAGNOSIS — G894 Chronic pain syndrome: Secondary | ICD-10-CM

## 2020-11-01 DIAGNOSIS — R0602 Shortness of breath: Secondary | ICD-10-CM | POA: Diagnosis not present

## 2020-11-01 DIAGNOSIS — K219 Gastro-esophageal reflux disease without esophagitis: Secondary | ICD-10-CM

## 2020-11-01 DIAGNOSIS — R42 Dizziness and giddiness: Secondary | ICD-10-CM

## 2020-11-01 DIAGNOSIS — Z87891 Personal history of nicotine dependence: Secondary | ICD-10-CM

## 2020-11-01 DIAGNOSIS — Z780 Asymptomatic menopausal state: Secondary | ICD-10-CM

## 2020-11-01 DIAGNOSIS — Z0289 Encounter for other administrative examinations: Secondary | ICD-10-CM

## 2020-11-01 DIAGNOSIS — Z6841 Body Mass Index (BMI) 40.0 and over, adult: Secondary | ICD-10-CM

## 2020-11-01 DIAGNOSIS — M170 Bilateral primary osteoarthritis of knee: Secondary | ICD-10-CM

## 2020-11-05 NOTE — Progress Notes (Signed)
Dear Dr. Rolena Infante,   Thank you for referring Shannon [name] to our clinic. The following note includes my evaluation and treatment recommendations.  Chief Complaint:   OBESITY Shannon [name] (MR# [id number]) is a 65 y.o. female who presents for evaluation and treatment of obesity and related comorbidities. Current BMI is Body mass index is 42.77 kg/m. Shannon [name] has been struggling with her weight for many years and has been unsuccessful in either losing weight, maintaining weight loss, or reaching her healthy weight goal.  Shannon [name] is currently in the action stage of change and ready to dedicate time achieving and maintaining a healthier weight. Shannon [name] is interested in becoming our patient and working on intensive lifestyle modifications including (but not limited to) diet and exercise for weight loss.  Shannon [name] says she eats at night.  She recently signed up for water aerobics at the [facility], but has not started yet.  [name]'s habits were reviewed today and are as follows: Her family eats meals together, she thinks her family will eat healthier with her, her desired weight loss is 117 pounds, she started gaining weight when she could no longer exercise, her heaviest weight ever was 276 pounds, she craves seafood, she snacks frequently in the evenings, she wakes up sometimes in the middle of the night to eat, she skips breakfast and/or lunch frequently, she is trying to follow a vegetarian diet, she is frequently drinking liquids with calories, and she frequently makes poor food choices.  Depression Screen Shannon [name] Food and Mood (modified PHQ-9) score was 26.  Depression screen PHQ 2/9 11/01/2020  Decreased Interest 3  Down, Depressed, Hopeless 3  PHQ - 2 Score 6  Altered sleeping 3  Tired, decreased energy 3  Change in appetite 3  Feeling bad or failure about yourself  3  Trouble concentrating 3  Moving slowly or fidgety/restless 3  Suicidal thoughts 2  PHQ-9 Score 26  Difficult doing  work/chores Extremely dIfficult   Assessment/Plan:   1. Other fatigue Shannon [name] denies daytime somnolence and reports waking up still tired. Patent has a history of symptoms of morning fatigue and morning headache. Tyshana generally gets 2-8 hours of sleep per night, and states that she has poor quality sleep. Snoring is not present. Apneic episodes are not present. Epworth Sleepiness Score is 2.  TSH was 1.55 on 10/03/2019.  Shannon [name] does feel that her weight is causing her energy to be lower than it should be. Fatigue may be related to obesity, depression or many other causes. Labs will be ordered, and in the meanwhile, Shannon [name] will focus on self care including making healthy food choices, increasing physical activity and focusing on stress reduction.  - EKG 12-Lead  2. SOB (shortness of breath) on exertion Shannon [name] notes increasing shortness of breath with exercising and seems to be worsening over time with weight gain. She notes getting out of breath sooner with activity than she used to. This has gotten worse recently. Shannon [name] denies shortness of breath at rest or orthopnea.  Shannon [name] does feel that she gets out of breath more easily that she used to when she exercises. Shannon [name] shortness of breath appears to be obesity related and exercise induced. She has agreed to work on weight loss and gradually increase exercise to treat her exercise induced shortness of breath. Will continue to monitor closely.  3. Visceral obesity Current visceral fat rating: 18. Visceral fat rating should be < 13. Visceral adipose tissue is a hormonally active component of total body fat. This body  composition phenotype is associated with medical disorders such as metabolic syndrome, cardiovascular disease and several malignancies including prostate, breast, and colorectal cancers. Starting goal: Lose 7-10% of starting weight.   4. Fibromyalgia Intensive lifestyle modifications are the first line treatment for this issue.  We discussed several lifestyle modifications today and she will continue to work on diet, exercise and weight loss efforts.We will continue to monitor. Orders and follow up as documented in patient record.   Counseling . Try [email or web address]-[email or web address]/, which is a series of self-care modules designed to teach patients several techniques to manage pain.   5. Chronic pain syndrome Followed by Dr. Rolena Infante and Dr. Nelva Bush.  She had a recent ESI for pain at L4/L5.  Taking ibuprofen 800 mg as needed.  6. Vitamin D deficiency At goal. Current vitamin D is 54.0, tested on 10/03/2019. Shannon [name] is taking OTC vitamin D 5,000 IU daily.  Plan: Continue current OTC vitamin D supplementation.  Follow-up for routine testing of Vitamin D, at least 2-3 times per year to avoid over-replacement.  7. Vertigo Shannon [name] says she has had no vertigo in 2.5 weeks, so she is happy.  No previous Neuro Rehab.  She has Antivert 25 mg to take if needed.  8. Hypertension associated with type 2 diabetes mellitus (HCC) Elevated.  Medications: Norvasc 5 mg daily, HCTZ 12.5 mg daily, losartan 100 mg daily.   Plan: Avoid buying foods that are: processed, frozen, or prepackaged to avoid excess salt. We will continue to monitor closely alongside her PCP and/or Specialist.  Regular follow up with PCP and specialists was also encouraged.   BP Readings from Last 3 Encounters:  11/01/20 (!) 142/84  03/04/19 (!) 178/103  10/30/17 (!) 158/77   Lab Results  Component Value Date   CREATININE 0.67 10/28/2017   9. Gastroesophageal reflux disease, unspecified whether esophagitis present Shannon [name] is taking Protonix 20 mg daily for GERD.  Plan:  Continue Protonix.  We reviewed the diagnosis of GERD and importance of treatment. We discussed "red flag" symptoms and the importance of follow up if symptoms persisted despite treatment. We reviewed non-pharmacologic management of GERD symptoms: including: caffeine reduction, dietary changes,  elevate HOB, NPO after supper, reduction of alcohol intake, tobacco cessation, and weight loss.  10. Type 2 diabetes mellitus with other specified complication, without long-term current use of insulin (HCC) Diabetes Mellitus: Not at goal. Medication: New: metformin 500 mg daily.  No side effects.  Issues reviewed: blood sugar goals, complications of diabetes mellitus, hypoglycemia prevention and treatment, exercise, and nutrition.  A1c was 7.2 on 10/03/2020, creatinine 0.80, LDL 75, HDL 38 (156, 124).    Plan: The importance of regular follow up with PCP and all other specialists as scheduled was stressed to patient today.  Lab Results  Component Value Date   CREATININE 0.67 10/28/2017   11. Postmenopausal Women with obesity may experience more severe vasomotor symptoms at menopause than women with overweight or normal weight. We will continue to monitor symptoms as they relate to her weight loss journey.  14. Former smoker Shannon [name] smoked 2 ppd for 51 years.  She quit on 10/23/2017.   Plan: commended patient for quitting and reviewed strategies for preventing relapses.  13. Sleep disorder Shannon [name] endorses morning headaches.  Epworth score is 2.  She says she is tired, even with 8+ hours of sleep.  She has chronic fatigue.  Previous sleep study negative for OSA, positive for RLS.  14. Osteoarthritis of both knees Shannon [name] has chronic pain in bilateral knees.  We will continue to monitor symptoms as they relate to her weight loss journey.  15. Other depression, with emotional eating Uncontrolled.  Medication: Cymbalta 20 mg twice daily.  PHQ-9 is 26.  Plan:  Behavior modification techniques were discussed today to help deal with emotional/non-hunger eating behaviors.  16. Class 3 severe obesity with serious comorbidity and body mass index (BMI) of 40.0 to 44.9 in adult, unspecified obesity type (HCC)  Shannon [name] is currently in the action stage of change and her goal is to continue with weight  loss efforts. I recommend Shannon [name] begin the structured treatment plan as follows:  She has agreed to the Category 2 Plan or the Power.  Exercise goals: No exercise has been prescribed at this time.   Behavioral modification strategies: increasing lean protein intake, decreasing simple carbohydrates, increasing vegetables, increasing water intake, decreasing liquid calories, decreasing sodium intake and increasing high fiber foods.  She was informed of the importance of frequent follow-up visits to maximize her success with intensive lifestyle modifications for her multiple health conditions. She was informed we would discuss her lab results at her next visit unless there is a critical issue that needs to be addressed sooner. Shannon [name] agreed to keep her next visit at the agreed upon time to discuss these results.  Objective:   Blood pressure (!) 142/84, pulse 79, temperature 97.9 F (36.6 C), temperature source Oral, height 5\' 6"  (1.676 m), weight 265 lb (120.2 kg), SpO2 95 %. Body mass index is 42.77 kg/m.  EKG: Normal sinus rhythm, rate 83 bpm.  Indirect Calorimeter completed today shows a VO2 of 258 and a REE of 1798.  Her calculated basal metabolic rate is 2355 thus her basal metabolic rate is worse than expected.  General: Cooperative, alert, well developed, in no acute distress. HEENT: Conjunctivae and lids unremarkable. Cardiovascular: Regular rhythm.  Lungs: Normal work of breathing. Neurologic: No focal deficits.   Lab Results  Component Value Date   CREATININE 0.67 10/28/2017   BUN 10 10/28/2017   NA 139 10/28/2017   K 3.3 (L) 10/28/2017   CL 105 10/28/2017   CO2 24 10/28/2017   Lab Results  Component Value Date   ALT 11 (L) 10/28/2017   AST 17 10/28/2017   ALKPHOS 102 10/28/2017   BILITOT 0.6 10/28/2017   Lab Results  Component Value Date   TSH 1.33 05/29/2015   Lab Results  Component Value Date   WBC 15.3 (H) 10/28/2017   HGB 14.3 10/28/2017    HCT 42.6 10/28/2017   MCV 87.8 10/28/2017   PLT 290 10/28/2017   Obesity Behavioral Intervention:   Approximately 15 minutes were spent on the discussion below.  ASK: We discussed the diagnosis of obesity with Shannon [name] today and Shannon [name] agreed to give [place] permission to discuss obesity behavioral modification therapy today.  ASSESS: Shannon [name] has the diagnosis of obesity and her BMI today is 42.8. Shannon [name] is in the action stage of change.   ADVISE: Avabella was educated on the multiple health risks of obesity as well as the benefit of weight loss to improve her health. She was advised of the need for long term treatment and the importance of lifestyle modifications to improve her current health and to decrease her risk of future health problems.  AGREE: Multiple dietary modification options and treatment options were discussed and Joe agreed to follow the recommendations documented in the above note.  ARRANGE: Tanyika was educated on the importance of frequent visits to treat obesity as outlined per  CMS and USPSTF guidelines and agreed to schedule her next follow up appointment today.  Attestation Statements:   This is the patient's first visit at [facility]. The patient's NEW PATIENT PACKET was reviewed at length. Included in the packet: current and past health history, medications, allergies, ROS, gynecologic history (women only), surgical history, family history, social history, weight history, weight loss surgery history (for those that have had weight loss surgery), nutritional evaluation, mood and food questionnaire, PHQ9, Epworth questionnaire, sleep habits questionnaire, patient life and health improvement goals questionnaire. These will all be scanned into the patient's chart under media.   During the visit, I independently reviewed the patient's EKG, bioimpedance scale results, and indirect calorimeter results. I used this information to tailor a meal plan for the  patient that will help her to lose weight and will improve her obesity-related conditions going forward. I performed a medically necessary appropriate examination and/or evaluation. I discussed the assessment and treatment plan with the patient. The patient was provided an opportunity to ask questions and all were answered. The patient agreed with the plan and demonstrated an understanding of the instructions. Labs were ordered at this visit and will be reviewed at the next visit unless more critical results need to be addressed immediately. Clinical information was updated and documented in the EMR.   I, Water quality scientist, CMA, am acting as transcriptionist for Briscoe Deutscher, DO  I have reviewed the above documentation for accuracy and completeness, and I agree with the above. Briscoe Deutscher, DO

## 2020-11-15 ENCOUNTER — Other Ambulatory Visit: Payer: Self-pay

## 2020-11-15 ENCOUNTER — Ambulatory Visit (INDEPENDENT_AMBULATORY_CARE_PROVIDER_SITE_OTHER): Payer: Medicare (Managed Care) | Admitting: Family Medicine

## 2020-11-15 ENCOUNTER — Encounter (INDEPENDENT_AMBULATORY_CARE_PROVIDER_SITE_OTHER): Payer: Self-pay | Admitting: Family Medicine

## 2020-11-15 VITALS — BP 135/85 | HR 82 | Temp 98.1°F | Ht 66.0 in | Wt 267.0 lb

## 2020-11-15 DIAGNOSIS — R42 Dizziness and giddiness: Secondary | ICD-10-CM

## 2020-11-15 DIAGNOSIS — E1159 Type 2 diabetes mellitus with other circulatory complications: Secondary | ICD-10-CM

## 2020-11-15 DIAGNOSIS — E1169 Type 2 diabetes mellitus with other specified complication: Secondary | ICD-10-CM | POA: Diagnosis not present

## 2020-11-15 DIAGNOSIS — G894 Chronic pain syndrome: Secondary | ICD-10-CM | POA: Diagnosis not present

## 2020-11-15 DIAGNOSIS — E65 Localized adiposity: Secondary | ICD-10-CM | POA: Diagnosis not present

## 2020-11-15 DIAGNOSIS — I152 Hypertension secondary to endocrine disorders: Secondary | ICD-10-CM

## 2020-11-15 DIAGNOSIS — Z6841 Body Mass Index (BMI) 40.0 and over, adult: Secondary | ICD-10-CM

## 2020-11-15 MED ORDER — OZEMPIC (0.25 OR 0.5 MG/DOSE) 2 MG/1.5ML ~~LOC~~ SOPN: 0.5000 mg | PEN_INJECTOR | SUBCUTANEOUS | 0 refills | Status: DC

## 2020-11-19 NOTE — Progress Notes (Signed)
Chief Complaint:   OBESITY Isaac is here to discuss her progress with her obesity treatment plan along with follow-up of her obesity related diagnoses.   Today's visit was #: 2 Starting weight: 265 lbs Starting date: 11/01/2020 Today's weight: 267 lbs Today's date: 11/15/2020 Total lbs lost to date: +2 Body mass index is 43.09 kg/m.   Interim History:  Mele endorses left-sided neck pain that is radiating down her arm.  She says she has poor sleep at baseline.  She reports experiencing more vertigo as well, and is taking meclizine daily.  Her eating window is from noon to 10 pm.  Therese provided the following food recall today:  Breakfast:  Following plan. Lunch:  Salads or skips. Dinner:  Generally not enough protein. Eats junk food at night.  Current Meal Plan: the Category 2 Plan for 50% of the time.  Current Exercise Plan: None.  Assessment/Plan:   1. Chronic pain syndrome Increased pain in left shoulder.  She carries a giant purse, and we discussed this.  2. Visceral obesity Current visceral fat rating: 18. Visceral fat rating should be < 13. Visceral adipose tissue is a hormonally active component of total body fat. This body composition phenotype is associated with medical disorders such as metabolic syndrome, cardiovascular disease and several malignancies including prostate, breast, and colorectal cancers. Starting goal: Lose 7-10% of starting weight.   3. Type 2 diabetes mellitus with other specified complication, without long-term current use of insulin (HCC) Medication: metformin 500 mg daily. Issues reviewed: blood sugar goals, complications of diabetes mellitus, hypoglycemia prevention and treatment, exercise, and nutrition.   Plan:  Stop metformin and start Ozempic 0.25 mg subcutaneously weekly, as per below.  The importance of regular follow up with PCP and all other specialists as scheduled was stressed to patient today.  Lab Results  Component Value  Date   CREATININE 0.67 10/28/2017   - Start Semaglutide,0.25 or 0.5MG /DOS, (OZEMPIC, 0.25 OR 0.5 MG/DOSE,) 2 MG/1.5ML SOPN; Inject 0.5 mg into the skin once a week.  Dispense: 1.5 mL; Refill: 0  4. Hypertension associated with type 2 diabetes mellitus ([place]) At goal. Medications: Norvasc 5 mg daily, HCTZ 12.5 mg daily, Cozaar 100 mg daily.   Plan: Avoid buying foods that are: processed, frozen, or prepackaged to avoid excess salt. We will continue to monitor closely alongside her PCP and/or Specialist.  Regular follow up with PCP and specialists was also encouraged.   BP Readings from Last 3 Encounters:  11/15/20 135/85  11/01/20 (!) 142/84  03/04/19 (!) 178/103   Lab Results  Component Value Date   CREATININE 0.67 10/28/2017   5. Vertigo Chronic. Will place referral to Neuro Vestibular Rehab today.  - AMB referral to rehabilitation  6. Class 3 severe obesity with serious comorbidity and body mass index (BMI) of 40.0 to 44.9 in adult, unspecified obesity type ([place])  Course: Danya is currently in the action stage of change. As such, her goal is to continue with weight loss efforts.   Nutrition goals: She has agreed to the Category 2 Plan.   Exercise goals: Increase activity.  Behavioral modification strategies: increasing lean protein intake, decreasing simple carbohydrates, increasing vegetables and increasing water intake.  Amiria has agreed to follow-up with our clinic in 3 weeks. She was informed of the importance of frequent follow-up visits to maximize her success with intensive lifestyle modifications for her multiple health conditions.   Objective:   Blood pressure 135/85, pulse 82, temperature 98.1 F (36.7  C), temperature source Oral, height 5\' 6"  (1.676 m), weight 267 lb (121.1 kg), SpO2 97 %. Body mass index is 43.09 kg/m.  General: Cooperative, alert, well developed, in no acute distress. HEENT: Conjunctivae and lids unremarkable. Cardiovascular: Regular  rhythm.  Lungs: Normal work of breathing. Neurologic: No focal deficits.   Lab Results  Component Value Date   CREATININE 0.67 10/28/2017   BUN 10 10/28/2017   NA 139 10/28/2017   K 3.3 (L) 10/28/2017   CL 105 10/28/2017   CO2 24 10/28/2017   Lab Results  Component Value Date   ALT 11 (L) 10/28/2017   AST 17 10/28/2017   ALKPHOS 102 10/28/2017   BILITOT 0.6 10/28/2017   Lab Results  Component Value Date   TSH 1.33 05/29/2015   Lab Results  Component Value Date   WBC 15.3 (H) 10/28/2017   HGB 14.3 10/28/2017   HCT 42.6 10/28/2017   MCV 87.8 10/28/2017   PLT 290 10/28/2017   Obesity Behavioral Intervention:   Approximately 15 minutes were spent on the discussion below.  ASK: We discussed the diagnosis of obesity with [name] today and Shana agreed to give [place] permission to discuss obesity behavioral modification therapy today.  ASSESS: Ambrea has the diagnosis of obesity and her BMI today is 43.1. Vernecia is in the action stage of change.   ADVISE: Jaydynn was educated on the multiple health risks of obesity as well as the benefit of weight loss to improve her health. She was advised of the need for long term treatment and the importance of lifestyle modifications to improve her current health and to decrease her risk of future health problems.  AGREE: Multiple dietary modification options and treatment options were discussed and Kayelyn agreed to follow the recommendations documented in the above note.  ARRANGE: Fernanda was educated on the importance of frequent visits to treat obesity as outlined per CMS and USPSTF guidelines and agreed to schedule her next follow up appointment today.  Attestation Statements:   Reviewed by clinician on day of visit: allergies, medications, problem list, medical history, surgical history, family history, social history, and previous encounter notes.  I, Water quality scientist, CMA, am acting as transcriptionist for Briscoe Deutscher, DO  I  have reviewed the above documentation for accuracy and completeness, and I agree with the above. Briscoe Deutscher, DO

## 2020-11-26 ENCOUNTER — Ambulatory Visit (INDEPENDENT_AMBULATORY_CARE_PROVIDER_SITE_OTHER): Payer: Medicare (Managed Care) | Admitting: Adult Health

## 2020-11-26 ENCOUNTER — Other Ambulatory Visit: Payer: Self-pay

## 2020-11-26 ENCOUNTER — Encounter (INDEPENDENT_AMBULATORY_CARE_PROVIDER_SITE_OTHER): Payer: Self-pay | Admitting: Adult Health

## 2020-11-26 VITALS — BP 158/95 | HR 88 | Temp 98.1°F | Ht 66.0 in | Wt 262.0 lb

## 2020-11-26 DIAGNOSIS — E1169 Type 2 diabetes mellitus with other specified complication: Secondary | ICD-10-CM

## 2020-11-26 DIAGNOSIS — E119 Type 2 diabetes mellitus without complications: Secondary | ICD-10-CM | POA: Insufficient documentation

## 2020-11-26 DIAGNOSIS — G894 Chronic pain syndrome: Secondary | ICD-10-CM | POA: Diagnosis not present

## 2020-11-26 DIAGNOSIS — E1159 Type 2 diabetes mellitus with other circulatory complications: Secondary | ICD-10-CM

## 2020-11-26 DIAGNOSIS — I152 Hypertension secondary to endocrine disorders: Secondary | ICD-10-CM

## 2020-11-26 DIAGNOSIS — Z6841 Body Mass Index (BMI) 40.0 and over, adult: Secondary | ICD-10-CM

## 2020-11-27 NOTE — Progress Notes (Addendum)
Chief Complaint:   OBESITY Shannon [name] is here to discuss her progress with her obesity treatment plan along with follow-up of her obesity related diagnoses. Shannon [name] is on the Category 2 Plan and states she is following her eating plan approximately 50% of the time. Shannon [name] states she is weight lifting arms due to back 10 minutes 3 times per week.  Today's visit was #: 3 Starting weight: 265 lbs Starting date: 11/01/2020 Today's weight: 262 lbs Today's date: 11/26/2020 Total lbs lost to date: 3 lbs Total lbs lost since last in-office visit: 5 lbs  Interim History: Shannon [name]'s BP is elevated, per pt, due to chronic pain and acute vertigo symptoms. She denies acute cardiac symptoms. Epic review- labs completed with PCP 10/03/2020 showed A1c of 7.2.  Subjective:   1. Hypertension associated with type 2 diabetes mellitus ([place]) Shannon [name]'s BP is elevated at OV today. She denies cardiac symptoms. She reports increase in pain- back and bilateral leg. Epic review reveals BP has been elevated at last several OV's at various providers. She is on amlodipine 5 mg QD, HCTZ 12.5 mg QD, and losartan 100 mg QD.  BP Readings from Last 3 Encounters:  11/26/20 (!) 158/95  11/15/20 135/85  11/01/20 (!) 142/84    2. Type 2 diabetes mellitus with other specified complication, without long-term current use of insulin (Shannon [name]) Shannon [name]'s 10/03/2020 A1c 7.2. She is on Ozempic 0.25 mg once a week.  3. Chronic pain syndrome Shannon [name] has a history of fibromyalgia, lumbar radiculopathy, bilateral knee pain, and cervical neck pain. She was recently seen by Dr. Gaye Pollack on 11/06/2020. An x-ray and anti-inflammatory prescription was provided.  Assessment/Plan:   1. Hypertension associated with type 2 diabetes mellitus ([place]) Shannon [name] is working on healthy weight loss and exercise to improve blood pressure control. We will watch for signs of hypotension as she continues her lifestyle modifications. Check ambulatory BP and bring log to  next OV.  2. Type 2 diabetes mellitus with other specified complication, without long-term current use of insulin (HCC) Good blood sugar control is important to decrease the likelihood of diabetic complications such as nephropathy, neuropathy, limb loss, blindness, coronary artery disease, and death. Intensive lifestyle modification including diet, exercise and weight loss are the first line of treatment for diabetes. Continue current anti-diabetic regimen.  3. Chronic pain syndrome Follow up with PCP and orthopedics as directed.  4. Class 3 severe obesity with serious comorbidity and body mass index (BMI) of 40.0 to 44.9 in adult, unspecified obesity type [facility]) Shannon [name] is currently in the action stage of change. As such, her goal is to continue with weight loss efforts. She has agreed to the Category 2 Plan.   1. Check voicemail to see if Rehab/PT referral has contacted pt 2. Keep PCP f/u May 2022  Exercise goals: As is  Behavioral modification strategies: increasing lean protein intake, decreasing simple carbohydrates, meal planning and cooking strategies and planning for success.  Shannon [name] has agreed to follow-up with our clinic in 2 weeks. She was informed of the importance of frequent follow-up visits to maximize her success with intensive lifestyle modifications for her multiple health conditions.   Objective:   Blood pressure (!) 158/95, pulse 88, temperature 98.1 F (36.7 C), height 5\' 6"  (1.676 m), weight 262 lb (118.8 kg), SpO2 97 %. Body mass index is 42.29 kg/m.  General: Cooperative, alert, well developed, in no acute distress. HEENT: Conjunctivae and lids unremarkable. Cardiovascular: Regular rhythm.  Lungs: Normal work of breathing. Neurologic: No  focal deficits.   Lab Results  Component Value Date   CREATININE 0.67 10/28/2017   BUN 10 10/28/2017   NA 139 10/28/2017   K 3.3 (L) 10/28/2017   CL 105 10/28/2017   CO2 24 10/28/2017   Lab Results  Component Value  Date   ALT 11 (L) 10/28/2017   AST 17 10/28/2017   ALKPHOS 102 10/28/2017   BILITOT 0.6 10/28/2017   No results found for: HGBA1C No results found for: INSULIN Lab Results  Component Value Date   TSH 1.33 05/29/2015   No results found for: CHOL, HDL, LDLCALC, LDLDIRECT, TRIG, CHOLHDL Lab Results  Component Value Date   WBC 15.3 (H) 10/28/2017   HGB 14.3 10/28/2017   HCT 42.6 10/28/2017   MCV 87.8 10/28/2017   PLT 290 10/28/2017    Attestation Statements:   Reviewed by clinician on day of visit: allergies, medications, problem list, medical history, surgical history, family history, social history, and previous encounter notes.  Time spent on visit including pre-visit chart review and post-visit care and charting was 32 minutes.   Coral Ceo, am acting as Location manager for Mina Marble, NP.  I have reviewed the above documentation for accuracy and completeness, and I agree with the above. -  Zannah Melucci d. Hurbert Duran, NP-C

## 2020-12-11 ENCOUNTER — Other Ambulatory Visit: Payer: Self-pay

## 2020-12-11 ENCOUNTER — Encounter (INDEPENDENT_AMBULATORY_CARE_PROVIDER_SITE_OTHER): Payer: Self-pay | Admitting: Adult Health

## 2020-12-11 ENCOUNTER — Ambulatory Visit (INDEPENDENT_AMBULATORY_CARE_PROVIDER_SITE_OTHER): Payer: Medicare HMO | Admitting: Adult Health

## 2020-12-11 VITALS — BP 146/82 | HR 84 | Temp 98.0°F | Ht 66.0 in | Wt 260.0 lb

## 2020-12-11 DIAGNOSIS — E1169 Type 2 diabetes mellitus with other specified complication: Secondary | ICD-10-CM | POA: Diagnosis not present

## 2020-12-11 DIAGNOSIS — Z6841 Body Mass Index (BMI) 40.0 and over, adult: Secondary | ICD-10-CM | POA: Diagnosis not present

## 2020-12-11 DIAGNOSIS — E1159 Type 2 diabetes mellitus with other circulatory complications: Secondary | ICD-10-CM

## 2020-12-11 DIAGNOSIS — I152 Hypertension secondary to endocrine disorders: Secondary | ICD-10-CM

## 2020-12-11 MED ORDER — OZEMPIC (0.25 OR 0.5 MG/DOSE) 2 MG/1.5ML ~~LOC~~ SOPN
0.5000 mg | PEN_INJECTOR | SUBCUTANEOUS | 0 refills | Status: DC
Start: 2020-12-11 — End: 2021-01-09

## 2020-12-12 NOTE — Progress Notes (Addendum)
Chief Complaint:   OBESITY Shannon [name] is here to discuss her progress with her obesity treatment plan along with follow-up of her obesity related diagnoses. Shannon [name] is on the Category 2 Plan and states she is following her eating plan approximately 90% of the time. Shannon [name] states she is stretching and weight training 10 minutes 3 times per week.  Today's visit was #: 4 Starting weight: 265 lbs Starting date: 11/01/2020 Today's weight: 260 lbs Today's date: 12/11/2020 Total lbs lost to date: 5 Total lbs lost since last in-office visit: 2  Interim History: Shannon [name] has been focusing on protein at each meal. Reviewed category 2 specifics at length with pt today.  Subjective:   1. Hypertension associated with type 2 diabetes mellitus ([place]) [name]'s BP/HR are improved from last OV. She is on amlodipine 5 mg QD and losartan 100 mg QD.  BP Readings from Last 3 Encounters:  12/11/20 (!) 146/82  11/26/20 (!) 158/95  11/15/20 135/85    2. Type 2 diabetes mellitus with other specified complication, without long-term current use of insulin ([place]) Shannon [name]'s 10/03/2020 A1c 7.2. She stopped Metformin as instructed and has been off for more than 3 weeks. She started Ozempic 0.25mg  11/15/2020. Been on this dose for 4 weeks. She denies any current GI symptoms.  Assessment/Plan:   1. Hypertension associated with type 2 diabetes mellitus ([place]) [name] is working on healthy weight loss and exercise to improve blood pressure control. We will watch for signs of hypotension as she continues her lifestyle modifications. Continue current anti-hypertensive regimen.  2. Type 2 diabetes mellitus with other specified complication, without long-term current use of insulin (HCC) Good blood sugar control is important to decrease the likelihood of diabetic complications such as nephropathy, neuropathy, limb loss, blindness, coronary artery disease, and death. Intensive lifestyle modification including diet,  exercise and weight loss are the first line of treatment for diabetes.   - Semaglutide,0.25 or 0.5MG /DOS, (OZEMPIC, 0.25 OR 0.5 MG/DOSE,) 2 MG/1.5ML SOPN; Inject 0.5 mg into the skin once a week.  Dispense: 1.5 mL; Refill: 0  3. Class 3 severe obesity with serious comorbidity and body mass index (BMI) of 40.0 to 44.9 in adult, unspecified obesity type [facility]) Shannon [name] is currently in the action stage of change. As such, her goal is to continue with weight loss efforts. She has agreed to the Category 2 Plan.   Shannon [name] is to pack meals/snacks in cooler bag and take with her when running errands.  Exercise goals: As is  Behavioral modification strategies: increasing lean protein intake, decreasing simple carbohydrates, decreasing eating out, meal planning and cooking strategies, better snacking choices and planning for success.  Shannon [name] has agreed to follow-up with our clinic in 2 weeks. She was informed of the importance of frequent follow-up visits to maximize her success with intensive lifestyle modifications for her multiple health conditions.   Objective:   Blood pressure (!) 146/82, pulse 84, temperature 98 F (36.7 C), height 5\' 6"  (1.676 m), weight 260 lb (117.9 kg), SpO2 98 %. Body mass index is 41.97 kg/m.  General: Cooperative, alert, well developed, in no acute distress. HEENT: Conjunctivae and lids unremarkable. Cardiovascular: Regular rhythm.  Lungs: Normal work of breathing. Neurologic: No focal deficits.   Lab Results  Component Value Date   CREATININE 0.67 10/28/2017   BUN 10 10/28/2017   NA 139 10/28/2017   K 3.3 (L) 10/28/2017   CL 105 10/28/2017   CO2 24 10/28/2017   Lab Results  Component Value Date  ALT 11 (L) 10/28/2017   AST 17 10/28/2017   ALKPHOS 102 10/28/2017   BILITOT 0.6 10/28/2017   No results found for: HGBA1C No results found for: INSULIN Lab Results  Component Value Date   TSH 1.33 05/29/2015   No results found for: CHOL, HDL, LDLCALC,  LDLDIRECT, TRIG, CHOLHDL Lab Results  Component Value Date   WBC 15.3 (H) 10/28/2017   HGB 14.3 10/28/2017   HCT 42.6 10/28/2017   MCV 87.8 10/28/2017   PLT 290 10/28/2017   No results found for: IRON, TIBC, FERRITIN  Obesity Behavioral Intervention:   Approximately 15 minutes were spent on the discussion below.  ASK: We discussed the diagnosis of obesity with Shannon [name] today and Shannon [name] agreed to give [place] permission to discuss obesity behavioral modification therapy today.  ASSESS: [name] has the diagnosis of obesity and her BMI today is 42. Shannon [name] is in the action stage of change.   ADVISE: Shannon [name] was educated on the multiple health risks of obesity as well as the benefit of weight loss to improve her health. She was advised of the need for long term treatment and the importance of lifestyle modifications to improve her current health and to decrease her risk of future health problems.  AGREE: Multiple dietary modification options and treatment options were discussed and Shannon [name] agreed to follow the recommendations documented in the above note.  ARRANGE: Shannon [name] was educated on the importance of frequent visits to treat obesity as outlined per CMS and USPSTF guidelines and agreed to schedule her next follow up appointment today.  Attestation Statements:   Reviewed by clinician on day of visit: allergies, medications, problem list, medical history, surgical history, family history, social history, and previous encounter notes.  Coral Ceo, am acting as Location manager for Mina Marble, NP.  I have reviewed the above documentation for accuracy and completeness, and I agree with the above. -  Cecely Rengel d. Davetta Olliff, NP-C

## 2020-12-27 ENCOUNTER — Ambulatory Visit (INDEPENDENT_AMBULATORY_CARE_PROVIDER_SITE_OTHER): Payer: Medicare HMO | Admitting: Family Medicine

## 2021-01-09 ENCOUNTER — Ambulatory Visit (INDEPENDENT_AMBULATORY_CARE_PROVIDER_SITE_OTHER): Payer: Medicare HMO | Admitting: Adult Health

## 2021-01-09 ENCOUNTER — Other Ambulatory Visit: Payer: Self-pay

## 2021-01-09 ENCOUNTER — Encounter (INDEPENDENT_AMBULATORY_CARE_PROVIDER_SITE_OTHER): Payer: Self-pay | Admitting: Adult Health

## 2021-01-09 VITALS — BP 135/84 | HR 78 | Temp 98.2°F | Ht 66.0 in | Wt 253.0 lb

## 2021-01-09 DIAGNOSIS — Z6841 Body Mass Index (BMI) 40.0 and over, adult: Secondary | ICD-10-CM | POA: Diagnosis not present

## 2021-01-09 DIAGNOSIS — E1169 Type 2 diabetes mellitus with other specified complication: Secondary | ICD-10-CM

## 2021-01-09 DIAGNOSIS — E66813 Obesity, class 3: Secondary | ICD-10-CM

## 2021-01-09 DIAGNOSIS — G894 Chronic pain syndrome: Secondary | ICD-10-CM | POA: Diagnosis not present

## 2021-01-09 MED ORDER — OZEMPIC (0.25 OR 0.5 MG/DOSE) 2 MG/1.5ML ~~LOC~~ SOPN: 0.5000 mg | PEN_INJECTOR | SUBCUTANEOUS | 0 refills | Status: DC

## 2021-01-09 NOTE — Progress Notes (Signed)
Chief Complaint:   OBESITY Shannon [name] is here to discuss her progress with her obesity treatment plan along with follow-up of her obesity related diagnoses. Shannon [name] is on the Category 2 Plan and states she is following her eating plan approximately 50% of the time. Shannon [name] states she is walking 30 minutes 2 times per week.  Today's visit was #: 5 Starting weight: 265 lbs Starting date: 11/01/2020 Today's weight: 253 lbs Today's date: 01/09/2021 Total lbs lost to date: 12 Total lbs lost since last in-office visit: 7  Interim History: Shannon [name] started [facility] 11/15/2020, and has titrated up to 0.5 mg. Pt denies mass in neck, dysphagia, dyspepsia, persistent hoarseness, constipation, or nausea. She would like to normalize weight with lifestyle modifications, instead of bariatric surgery.  Interval Goal: Wake up and get up out of bed at 0800 each morning.   Subjective:   1. Type 2 diabetes mellitus with other specified complication, without long-term current use of insulin ([place]) Shannon [name] started [facility] 11/15/2020, and has titrated up to 0.5 mg. Pt denies mass in neck, dysphagia, dyspepsia, persistent hoarseness, constipation, or nausea. Ambulatory fasting BG 100-120. She denies episodes of hypoglycemia.  2. Chronic pain syndrome Shannon [name] is on duloxetine 20 mg QD.   Assessment/Plan:   1. Type 2 diabetes mellitus with other specified complication, without long-term current use of insulin (Shannon [name]) Good blood sugar control is important to decrease the likelihood of diabetic complications such as nephropathy, neuropathy, limb loss, blindness, coronary artery disease, and death. Intensive lifestyle modification including diet, exercise and weight loss are the first line of treatment for diabetes.   - Semaglutide,0.25 or 0.5MG /DOS, (OZEMPIC, 0.25 OR 0.5 MG/DOSE,) 2 MG/1.5ML SOPN; Inject 0.5 mg into the skin once a week.  Dispense: 1.5 mL; Refill: 0  2. Chronic pain syndrome Continue SNRI as  directed.  3. Class 3 severe obesity with serious comorbidity and body mass index (BMI) of 40.0 to 44.9 in adult, unspecified obesity type Shannon [name])  Shannon [name] is currently in the action stage of change. As such, her goal is to continue with weight loss efforts. She has agreed to the Category 2 Plan.   Exercise goals: As is  Behavioral modification strategies: increasing lean protein intake, meal planning and cooking strategies, better snacking choices and planning for success.  Shannon [name] has agreed to follow-up with our clinic in 3 weeks. She was informed of the importance of frequent follow-up visits to maximize her success with intensive lifestyle modifications for her multiple health conditions.   Objective:   Blood pressure 135/84, pulse 78, temperature 98.2 F (36.8 C), height 5\' 6"  (1.676 m), weight 253 lb (114.8 kg), SpO2 99 %. Body mass index is 40.84 kg/m.  General: Cooperative, alert, well developed, in no acute distress. HEENT: Conjunctivae and lids unremarkable. Cardiovascular: Regular rhythm.  Lungs: Normal work of breathing. Neurologic: No focal deficits.   Lab Results  Component Value Date   CREATININE 0.67 10/28/2017   BUN 10 10/28/2017   NA 139 10/28/2017   K 3.3 (L) 10/28/2017   CL 105 10/28/2017   CO2 24 10/28/2017   Lab Results  Component Value Date   ALT 11 (L) 10/28/2017   AST 17 10/28/2017   ALKPHOS 102 10/28/2017   BILITOT 0.6 10/28/2017   No results found for: HGBA1C No results found for: INSULIN Lab Results  Component Value Date   TSH 1.33 05/29/2015   No results found for: CHOL, HDL, LDLCALC, LDLDIRECT, TRIG, CHOLHDL Lab Results  Component Value Date  WBC 15.3 (H) 10/28/2017   HGB 14.3 10/28/2017   HCT 42.6 10/28/2017   MCV 87.8 10/28/2017   PLT 290 10/28/2017   No results found for: IRON, TIBC, FERRITIN  Obesity Behavioral Intervention:   Approximately 15 minutes were spent on the discussion below.  ASK: We discussed the diagnosis  of obesity with Shannon [name] today and Shannon [name] agreed to give [place] permission to discuss obesity behavioral modification therapy today.  ASSESS: Shannon [name] has the diagnosis of obesity and her BMI today is 40.9. Shannon [name] is in the action stage of change.   ADVISE: Shannon [name] was educated on the multiple health risks of obesity as well as the benefit of weight loss to improve her health. She was advised of the need for long term treatment and the importance of lifestyle modifications to improve her current health and to decrease her risk of future health problems.  AGREE: Multiple dietary modification options and treatment options were discussed and Shannon [name] agreed to follow the recommendations documented in the above note.  ARRANGE: Shannon [name] was educated on the importance of frequent visits to treat obesity as outlined per CMS and USPSTF guidelines and agreed to schedule her next follow up appointment today.  Attestation Statements:   Reviewed by clinician on day of visit: allergies, medications, problem list, medical history, surgical history, family history, social history, and previous encounter notes.  Coral Ceo, CMA, am acting as transcriptionist for Mina Marble, NP.  I have reviewed the above documentation for accuracy and completeness, and I agree with the above. -  Jaylynne Birkhead d. Rooney Gladwin, NP-C

## 2021-01-10 ENCOUNTER — Other Ambulatory Visit: Payer: Self-pay | Admitting: Family Medicine

## 2021-01-10 DIAGNOSIS — Z1231 Encounter for screening mammogram for malignant neoplasm of breast: Secondary | ICD-10-CM

## 2021-01-31 ENCOUNTER — Ambulatory Visit (INDEPENDENT_AMBULATORY_CARE_PROVIDER_SITE_OTHER): Payer: Medicare HMO | Admitting: Adult Health

## 2021-01-31 ENCOUNTER — Other Ambulatory Visit: Payer: Self-pay

## 2021-01-31 ENCOUNTER — Encounter (INDEPENDENT_AMBULATORY_CARE_PROVIDER_SITE_OTHER): Payer: Self-pay | Admitting: Adult Health

## 2021-01-31 VITALS — BP 142/83 | HR 84 | Temp 98.3°F | Ht 66.0 in | Wt 250.0 lb

## 2021-01-31 DIAGNOSIS — I152 Hypertension secondary to endocrine disorders: Secondary | ICD-10-CM | POA: Diagnosis not present

## 2021-01-31 DIAGNOSIS — Z6841 Body Mass Index (BMI) 40.0 and over, adult: Secondary | ICD-10-CM

## 2021-01-31 DIAGNOSIS — E1159 Type 2 diabetes mellitus with other circulatory complications: Secondary | ICD-10-CM | POA: Diagnosis not present

## 2021-01-31 DIAGNOSIS — E1169 Type 2 diabetes mellitus with other specified complication: Secondary | ICD-10-CM | POA: Diagnosis not present

## 2021-01-31 MED ORDER — OZEMPIC (0.25 OR 0.5 MG/DOSE) 2 MG/1.5ML ~~LOC~~ SOPN: 0.5000 mg | PEN_INJECTOR | SUBCUTANEOUS | 0 refills | Status: DC

## 2021-02-05 DIAGNOSIS — M5416 Radiculopathy, lumbar region: Secondary | ICD-10-CM | POA: Diagnosis not present

## 2021-02-05 NOTE — Progress Notes (Signed)
Chief Complaint:   OBESITY Shannon [name] is here to discuss her progress with her obesity treatment plan along with follow-up of her obesity related diagnoses. Shannon [name] is on the Category 2 Plan and states she is following her eating plan approximately 50% of the time. Shannon [name] states she is walking 30 minutes 2 times per week.  Today's visit was #: 6 Starting weight: 265 lbs Starting date: 11/01/2020 Today's weight: 250 lbs Today's date: 01/31/2021 Total lbs lost to date: 15 Total lbs lost since last in-office visit: 3  Interim History: Shannon [name] is following category 2 meal plan approximately 50% of the time. The other 50% of the time she will snack off plan or enjoy fried fish or croissants.  Subjective:   1. Type 2 diabetes mellitus with other specified complication, without long-term current use of insulin (HCC) Shannon [name] is on Ozempic 0.5 mg once weekly. Pt denies mass in neck, dysphagia, dyspepsia, persistent hoarseness, nausea, or constipation.  2. Hypertension associated with type 2 diabetes mellitus ([place]) Shannon [name] is on losartan 100 mg QD and HCTZ 12.5 mg QD.  BP/HR at goal.  BP Readings from Last 3 Encounters:  01/31/21 (!) 142/83  01/09/21 135/84  12/11/20 (!) 146/82    Assessment/Plan:   1. Type 2 diabetes mellitus with other specified complication, without long-term current use of insulin (HCC) Good blood sugar control is important to decrease the likelihood of diabetic complications such as nephropathy, neuropathy, limb loss, blindness, coronary artery disease, and death. Intensive lifestyle modification including diet, exercise and weight loss are the first line of treatment for diabetes.  - Semaglutide,0.25 or 0.5MG /DOS, (OZEMPIC, 0.25 OR 0.5 MG/DOSE,) 2 MG/1.5ML SOPN; Inject 0.5 mg into the skin once a week.  Dispense: 1.5 mL; Refill: 0  2. Hypertension associated with type 2 diabetes mellitus ([place]) Shannon [name] is working on healthy weight loss and exercise to improve  blood pressure control. We will watch for signs of hypotension as she continues her lifestyle modifications. -Continue current anti-hypertensive regimen.  3. Class 3 severe obesity with serious comorbidity and body mass index (BMI) of 40.0 to 44.9 in adult, unspecified obesity type Shannon [name])  Shannon [name] is currently in the action stage of change. As such, her goal is to continue with weight loss efforts. She has agreed to the Category 2 Plan.   2 small pieces of fried fish each week Provided calorie/protein goals for each meal Handout: Recipe Guide  Exercise goals: As is  Behavioral modification strategies: increasing lean protein intake, decreasing simple carbohydrates, meal planning and cooking strategies, keeping healthy foods in the home and planning for success.  Shannon [name] has agreed to follow-up with our clinic in 3 weeks. She was informed of the importance of frequent follow-up visits to maximize her success with intensive lifestyle modifications for her multiple health conditions.   Objective:   Blood pressure (!) 142/83, pulse 84, temperature 98.3 F (36.8 C), height 5\' 6"  (1.676 m), weight 250 lb (113.4 kg), SpO2 97 %. Body mass index is 40.35 kg/m.  General: Cooperative, alert, well developed, in no acute distress. HEENT: Conjunctivae and lids unremarkable. Cardiovascular: Regular rhythm.  Lungs: Normal work of breathing. Neurologic: No focal deficits.   Lab Results  Component Value Date   CREATININE 0.67 10/28/2017   BUN 10 10/28/2017   NA 139 10/28/2017   K 3.3 (L) 10/28/2017   CL 105 10/28/2017   CO2 24 10/28/2017   Lab Results  Component Value Date   ALT 11 (L) 10/28/2017   AST  17 10/28/2017   ALKPHOS 102 10/28/2017   BILITOT 0.6 10/28/2017   No results found for: HGBA1C No results found for: INSULIN Lab Results  Component Value Date   TSH 1.33 05/29/2015   No results found for: CHOL, HDL, LDLCALC, LDLDIRECT, TRIG, CHOLHDL Lab Results  Component Value Date    WBC 15.3 (H) 10/28/2017   HGB 14.3 10/28/2017   HCT 42.6 10/28/2017   MCV 87.8 10/28/2017   PLT 290 10/28/2017   No results found for: IRON, TIBC, FERRITIN  Obesity Behavioral Intervention:   Approximately 15 minutes were spent on the discussion below.  ASK: We discussed the diagnosis of obesity with [name] today and Gavina agreed to give [place] permission to discuss obesity behavioral modification therapy today.  ASSESS: Rahmah has the diagnosis of obesity and her BMI today is 40.4. Elinor is in the action stage of change.   ADVISE: Caylie was educated on the multiple health risks of obesity as well as the benefit of weight loss to improve her health. She was advised of the need for long term treatment and the importance of lifestyle modifications to improve her current health and to decrease her risk of future health problems.  AGREE: Multiple dietary modification options and treatment options were discussed and Shena agreed to follow the recommendations documented in the above note.  ARRANGE: Rosy was educated on the importance of frequent visits to treat obesity as outlined per CMS and USPSTF guidelines and agreed to schedule her next follow up appointment today.  Attestation Statements:   Reviewed by clinician on day of visit: allergies, medications, problem list, medical history, surgical history, family history, social history, and previous encounter notes.  Coral Ceo, CMA, am acting as transcriptionist for Mina Marble, NP.  I have reviewed the above documentation for accuracy and completeness, and I agree with the above. -  Lashae Wollenberg d. Lylla Eifler, NP-C

## 2021-02-21 ENCOUNTER — Other Ambulatory Visit: Payer: Self-pay

## 2021-02-21 ENCOUNTER — Ambulatory Visit (INDEPENDENT_AMBULATORY_CARE_PROVIDER_SITE_OTHER): Payer: Medicare HMO | Admitting: Adult Health

## 2021-02-21 ENCOUNTER — Encounter (INDEPENDENT_AMBULATORY_CARE_PROVIDER_SITE_OTHER): Payer: Self-pay | Admitting: Adult Health

## 2021-02-21 VITALS — BP 148/92 | HR 97 | Temp 98.2°F | Ht 66.0 in | Wt 251.0 lb

## 2021-02-21 DIAGNOSIS — E1169 Type 2 diabetes mellitus with other specified complication: Secondary | ICD-10-CM | POA: Diagnosis not present

## 2021-02-21 DIAGNOSIS — Z6841 Body Mass Index (BMI) 40.0 and over, adult: Secondary | ICD-10-CM

## 2021-02-21 DIAGNOSIS — I152 Hypertension secondary to endocrine disorders: Secondary | ICD-10-CM

## 2021-02-21 DIAGNOSIS — E1159 Type 2 diabetes mellitus with other circulatory complications: Secondary | ICD-10-CM | POA: Diagnosis not present

## 2021-02-21 DIAGNOSIS — K5900 Constipation, unspecified: Secondary | ICD-10-CM | POA: Insufficient documentation

## 2021-02-21 MED ORDER — OZEMPIC (0.25 OR 0.5 MG/DOSE) 2 MG/1.5ML ~~LOC~~ SOPN: 0.5000 mg | PEN_INJECTOR | SUBCUTANEOUS | 0 refills | Status: DC

## 2021-02-21 NOTE — Progress Notes (Signed)
Chief Complaint:   OBESITY Shannon [name] is here to discuss her progress with her obesity treatment plan along with follow-up of her obesity related diagnoses. Shannon [name] is on the Category 2 Plan and states she is following her eating plan approximately 65% of the time. Shannon [name] states she is not currently exercising.  Today's visit was #: 7 Starting weight: 265 lbs Starting date: 11/01/2020 Today's weight: 251 lbs Today's date: 02/21/2021 Total lbs lost to date: 14 Total lbs lost since last in-office visit: 0  Interim History: Shannon [name] is disappointed to have gained 1 lb- she enjoyed some fired fish. She has been experiencing increased fatigue and constipation the last week. She also reports left ear ache the last 4 days She has regular/maintenance OV with fasting labs with PCP, Dr. Radene Ou, next week. She was started on Ozempic 11/15/2020. She increased to 1 mg Ozempic on 12/11/2020.  Subjective:   1. Type 2 diabetes mellitus with other specified complication, without long-term current use of insulin ([place]) 10/03/2020 A1c was 7.2 at PCP office. Shannon [name]'s ambulatory fasting BG run in the 110's. She was started on Ozempic 11/15/2020 and increased to 1 mg on 12/11/2020.  She is currently experiencing nausea and constipation- will hold at 0.5 mg dose.  Consider increasing GLP-1 dose at next OV if symptoms resolve.  2. Hypertension associated with type 2 diabetes mellitus (HCC) BP slightly elevated at OV.  Shannon [name] denies CP/dyspnea/palpitations.  She is on losartan 100 mg. HCTZ 12.5 mg QD, and amlodipine 5 mg QD. She stopped tobacco use over 4 years ago (Chantix- 2nd round was successful).  BP Readings from Last 3 Encounters:  02/21/21 (!) 148/92  01/31/21 (!) 142/83  01/09/21 135/84   3. Constipation, unspecified constipation type Chronic constipation, recently worsening. Shannon [name] is also experiencing occasional nausea with dry heaving. Diffuse abdominal pain, denies hematochezia. Appendectomy  2019.  Assessment/Plan:   1. Type 2 diabetes mellitus with other specified complication, without long-term current use of insulin (HCC) Good blood sugar control is important to decrease the likelihood of diabetic complications such as nephropathy, neuropathy, limb loss, blindness, coronary artery disease, and death. Intensive lifestyle modification including diet, exercise and weight loss are the first line of treatment for diabetes.   - Semaglutide,0.25 or 0.5MG /DOS, (OZEMPIC, 0.25 OR 0.5 MG/DOSE,) 2 MG/1.5ML SOPN; Inject 0.5 mg into the skin once a week.  Dispense: 1.5 mL; Refill: 0  2. Hypertension associated with type 2 diabetes mellitus ([place]) Shannon [name] is working on healthy weight loss and exercise to improve blood pressure control. We will watch for signs of hypotension as she continues her lifestyle modifications. Continue current anti-hypertensive regimen.  3. Constipation, unspecified constipation type Shannon [name] was informed that a decrease in bowel movement frequency is normal while losing weight, but stools should not be hard or painful. Orders and follow up as documented in patient record.  Use OTC Miralax as directed. Continue to drink plenty of water.  Counseling Getting to Good Bowel Health: Your goal is to have one soft bowel movement each day. Drink at least 8 glasses of water each day. Eat plenty of fiber (goal is over 25 grams each day). It is best to get most of your fiber from dietary sources which includes leafy green vegetables, fresh fruit, and whole grains. You may need to add fiber with the help of OTC fiber supplements. These include Metamucil, Citrucel, and Flaxseed. If you are still having trouble, try adding Miralax or Magnesium Citrate. If all of these changes do not  work, Cabin [employer].  4. Class 3 severe obesity with serious comorbidity and body mass index (BMI) of 40.0 to 44.9 in adult, unspecified obesity type Shannon [name])  Shannon [name] is currently in the action  stage of change. As such, her goal is to continue with weight loss efforts. She has agreed to the Category 2 Plan.   Exercise goals:  As is  Behavioral modification strategies: increasing lean protein intake, decreasing simple carbohydrates, meal planning and cooking strategies, keeping healthy foods in the home, and planning for success.  Shannon [name] has agreed to follow-up with our clinic in 4 weeks. She was informed of the importance of frequent follow-up visits to maximize her success with intensive lifestyle modifications for her multiple health conditions.   Objective:   Blood pressure (!) 148/92, pulse 97, temperature 98.2 F (36.8 C), height 5\' 6"  (1.676 m), weight 251 lb (113.9 kg), SpO2 96 %. Body mass index is 40.51 kg/m.  General: Cooperative, alert, well developed, in no acute distress. HEENT: Conjunctivae and lids unremarkable. Cardiovascular: Regular rhythm.  Lungs: Normal work of breathing. Neurologic: No focal deficits.   Lab Results  Component Value Date   CREATININE 0.67 10/28/2017   BUN 10 10/28/2017   NA 139 10/28/2017   K 3.3 (L) 10/28/2017   CL 105 10/28/2017   CO2 24 10/28/2017   Lab Results  Component Value Date   ALT 11 (L) 10/28/2017   AST 17 10/28/2017   ALKPHOS 102 10/28/2017   BILITOT 0.6 10/28/2017   No results found for: HGBA1C No results found for: INSULIN Lab Results  Component Value Date   TSH 1.33 05/29/2015   No results found for: CHOL, HDL, LDLCALC, LDLDIRECT, TRIG, CHOLHDL Lab Results  Component Value Date   WBC 15.3 (H) 10/28/2017   HGB 14.3 10/28/2017   HCT 42.6 10/28/2017   MCV 87.8 10/28/2017   PLT 290 10/28/2017   No results found for: IRON, TIBC, FERRITIN  Obesity Behavioral Intervention:   Approximately 15 minutes were spent on the discussion below.  ASK: We discussed the diagnosis of obesity with Shannon [name] today and Shannon [name] agreed to give [place] permission to discuss obesity behavioral modification therapy  today.  ASSESS: Shannon [name] has the diagnosis of obesity and her BMI today is 40.6. Shannon [name] is in the action stage of change.   ADVISE: Shannon [name] was educated on the multiple health risks of obesity as well as the benefit of weight loss to improve her health. She was advised of the need for long term treatment and the importance of lifestyle modifications to improve her current health and to decrease her risk of future health problems.  AGREE: Multiple dietary modification options and treatment options were discussed and Shannon [name] agreed to follow the recommendations documented in the above note.  ARRANGE: Adeleine was educated on the importance of frequent visits to treat obesity as outlined per CMS and USPSTF guidelines and agreed to schedule her next follow up appointment today.  Attestation Statements:   Reviewed by clinician on day of visit: allergies, medications, problem list, medical history, surgical history, family history, social history, and previous encounter notes.  Coral Ceo, CMA, am acting as transcriptionist for Mina Marble, NP.  I have reviewed the above documentation for accuracy and completeness, and I agree with the above. -  Shannon [name] [place]chell d. Kyleen Villatoro, NP-C

## 2021-03-06 ENCOUNTER — Other Ambulatory Visit: Payer: Self-pay

## 2021-03-06 ENCOUNTER — Ambulatory Visit
Admission: RE | Admit: 2021-03-06 | Discharge: 2021-03-06 | Disposition: A | Discharge status: Home / Self Care | Payer: Medicare HMO | Source: Ambulatory Visit | Attending: Family Medicine | Admitting: Family Medicine

## 2021-03-06 DIAGNOSIS — Z1231 Encounter for screening mammogram for malignant neoplasm of breast: Secondary | ICD-10-CM | POA: Diagnosis not present

## 2021-03-25 ENCOUNTER — Ambulatory Visit (INDEPENDENT_AMBULATORY_CARE_PROVIDER_SITE_OTHER): Payer: Medicare HMO | Admitting: Adult Health

## 2021-04-02 ENCOUNTER — Ambulatory Visit (INDEPENDENT_AMBULATORY_CARE_PROVIDER_SITE_OTHER): Payer: Medicare HMO | Admitting: Adult Health

## 2021-04-03 ENCOUNTER — Ambulatory Visit (INDEPENDENT_AMBULATORY_CARE_PROVIDER_SITE_OTHER): Payer: Medicare HMO | Admitting: Adult Health

## 2021-04-03 ENCOUNTER — Other Ambulatory Visit: Payer: Self-pay

## 2021-04-03 ENCOUNTER — Encounter (INDEPENDENT_AMBULATORY_CARE_PROVIDER_SITE_OTHER): Payer: Self-pay | Admitting: Adult Health

## 2021-04-03 VITALS — BP 142/83 | HR 79 | Temp 98.0°F | Ht 66.0 in | Wt 251.0 lb

## 2021-04-03 DIAGNOSIS — E1169 Type 2 diabetes mellitus with other specified complication: Secondary | ICD-10-CM

## 2021-04-03 DIAGNOSIS — Z6841 Body Mass Index (BMI) 40.0 and over, adult: Secondary | ICD-10-CM | POA: Diagnosis not present

## 2021-04-03 DIAGNOSIS — E1159 Type 2 diabetes mellitus with other circulatory complications: Secondary | ICD-10-CM

## 2021-04-03 DIAGNOSIS — I152 Hypertension secondary to endocrine disorders: Secondary | ICD-10-CM

## 2021-04-03 MED ORDER — OZEMPIC (0.25 OR 0.5 MG/DOSE) 2 MG/1.5ML ~~LOC~~ SOPN: 1.0000 mg | PEN_INJECTOR | SUBCUTANEOUS | 0 refills | Status: DC

## 2021-04-04 DIAGNOSIS — M5416 Radiculopathy, lumbar region: Secondary | ICD-10-CM | POA: Diagnosis not present

## 2021-04-04 DIAGNOSIS — M5136 Other intervertebral disc degeneration, lumbar region: Secondary | ICD-10-CM | POA: Diagnosis not present

## 2021-04-04 LAB — HEMOGLOBIN A1C
Est. average glucose Bld gHb Est-mCnc: 128 mg/dL
Hgb A1c MFr Bld: 6.1 % — ABNORMAL HIGH (ref 4.8–5.6)

## 2021-04-08 NOTE — Progress Notes (Signed)
Chief Complaint:   OBESITY Shannon Shannon [name] is here to discuss her progress with her obesity treatment plan along with follow-up of her obesity related diagnoses. Shannon Shannon [name] is on the Category 2 Plan and states she is following her eating plan approximately 30% of the time. Shannon Shannon [name] states she is not exercising regularly at this time.  Shannon [name]'s visit was #: 8 Starting weight: 265 lbs Starting date: 11/01/2020 Shannon [name]'s weight: 251 lbs Shannon [name]'s date: 04/03/2021 Total lbs lost to date: 14 lbs Total lbs lost since last in-office visit: 0  Interim History: Shannon Shannon [name] Shannon [name] had an increase in overall pain in bilateral knees and lumbar back.  Due to increased pain, she Shannon [name] been eating off plan.  She Shannon [name] been skipping breakfast, often eating past 1400 each day for her first meal.   GI upset with Ozempic 0.5 mg Shannon [name] resolved since last office visit.  Subjective:   1. Type 2 diabetes mellitus with other specified complication, without long-term current use of insulin (HCC) Metformin stopped.  Started on Ozempic on 11/15/2020.  Currently on Ozempic 0.5 mg once weekly, increased on 12/11/2020.   Ambulatory fasting BG 110-112.  She reports resolution of nausea and constipation.   A1c 7.2 on 10/03/2020 with PCP.  Lab Results  Component Value Date   CREATININE 0.67 10/28/2017   2. Hypertension associated with type 2 diabetes mellitus ([place]) She is on amlodipine 5 mg daily, HTZ 12.5 mg daily, losartan 100 mg daily.  She denies CP/palpitations/dyspnea.  BP Readings from Last 3 Encounters:  04/03/21 (!) 142/83  02/21/21 (!) 148/92  01/31/21 (!) 142/83   Assessment/Plan:   1. Type 2 diabetes mellitus with other specified complication, without long-term current use of insulin (HCC) Good blood sugar control is important to decrease the likelihood of diabetic complications such as nephropathy, neuropathy, limb loss, blindness, coronary artery disease, and death. Intensive lifestyle modification including diet, exercise and  weight loss are the first line of treatment for diabetes.  Increase Ozempic to 1 mg once weekly.  Check labs Shannon [name].  - Increase and refill Semaglutide,0.25 or 0.'5MG'$ /DOS, (OZEMPIC, 0.25 OR 0.5 MG/DOSE,) 2 MG/1.5ML SOPN; Inject 1 mg into the skin once a week.  Dispense: 3 mL; Refill: 0 - Hemoglobin A1c  2. Hypertension associated with type 2 diabetes mellitus ([place]) Shannon Shannon [name] is working on healthy weight loss and exercise to improve blood pressure control. We will watch for signs of hypotension as she continues her lifestyle modifications.  Continue current antihypertensive.   3. Class 3 severe obesity with serious comorbidity and body mass index (BMI) of 40.0 to 44.9 in adult, unspecified obesity type [facility])  Shannon Shannon [name] is currently in the action stage of change. As such, her goal is to continue with weight loss efforts. She Shannon [name] agreed to the Category 2 Plan.   Exercise goals:  Activity as tolerated.  [employer] - biking/water aerobics.  Behavioral modification strategies: increasing lean protein intake, decreasing simple carbohydrates, decreasing eating out, no skipping meals, keeping healthy foods in the home, ways to avoid boredom eating, better snacking choices, and planning for success.  Shannon Shannon [name] agreed to follow-up with our clinic in 2-3 weeks. She was informed of the importance of frequent follow-up visits to maximize her success with intensive lifestyle modifications for her multiple health conditions.   Objective:   Blood pressure (!) 142/83, pulse 79, temperature 98 F (36.7 C), height '5\' 6"'$  (1.676 m), weight 251 lb (113.9 kg), SpO2 98 %. Body mass index is 40.51 kg/m.  General: Cooperative, alert,  well developed, in no acute distress. HEENT: Conjunctivae and lids unremarkable. Cardiovascular: Regular rhythm.  Lungs: Normal work of breathing. Neurologic: No focal deficits.   Lab Results  Component Value Date   CREATININE 0.67 10/28/2017   BUN 10 10/28/2017   NA 139 10/28/2017   K  3.3 (L) 10/28/2017   CL 105 10/28/2017   CO2 24 10/28/2017   Lab Results  Component Value Date   ALT 11 (L) 10/28/2017   AST 17 10/28/2017   ALKPHOS 102 10/28/2017   BILITOT 0.6 10/28/2017   Lab Results  Component Value Date   TSH 1.33 05/29/2015   Lab Results  Component Value Date   WBC 15.3 (H) 10/28/2017   HGB 14.3 10/28/2017   HCT 42.6 10/28/2017   MCV 87.8 10/28/2017   PLT 290 10/28/2017   Obesity Behavioral Intervention:   Approximately 15 minutes were spent on the discussion below.  ASK: We discussed the diagnosis of obesity with Shannon Shannon [name] and Shannon Shannon [name] agreed to give [place] permission to discuss obesity behavioral modification therapy Shannon [name].  ASSESS: Shannon Shannon [name] the diagnosis of obesity and her BMI Shannon [name] is 40.5. Shannon Shannon [name] is in the action stage of change.   ADVISE: Shannon Shannon [name] was educated on the multiple health risks of obesity as well as the benefit of weight loss to improve her health. She was advised of the need for long term treatment and the importance of lifestyle modifications to improve her current health and to decrease her risk of future health problems.  AGREE: Multiple dietary modification options and treatment options were discussed and Shannon Shannon [name] agreed to follow the recommendations documented in the above note.  ARRANGE: Shannon [name] was educated on the importance of frequent visits to treat obesity as outlined per CMS and USPSTF guidelines and agreed to schedule her next follow up appointment Shannon [name].  Attestation Statements:   Reviewed by clinician on day of visit: allergies, medications, problem list, medical history, surgical history, family history, social history, and previous encounter notes.  I, Water quality scientist, CMA, am acting as Location manager for Mina Marble, NP.  I have reviewed the above documentation for accuracy and completeness, and I agree with the above. -  Tommi Crepeau d. Shannon Petro, NP-C

## 2021-04-22 ENCOUNTER — Ambulatory Visit (INDEPENDENT_AMBULATORY_CARE_PROVIDER_SITE_OTHER): Payer: Medicare HMO | Admitting: Adult Health

## 2021-04-22 DIAGNOSIS — M48062 Spinal stenosis, lumbar region with neurogenic claudication: Secondary | ICD-10-CM | POA: Diagnosis not present

## 2021-04-22 DIAGNOSIS — M4316 Spondylolisthesis, lumbar region: Secondary | ICD-10-CM | POA: Diagnosis not present

## 2021-04-22 DIAGNOSIS — Z6841 Body Mass Index (BMI) 40.0 and over, adult: Secondary | ICD-10-CM | POA: Diagnosis not present

## 2021-04-22 DIAGNOSIS — I10 Essential (primary) hypertension: Secondary | ICD-10-CM | POA: Diagnosis not present

## 2021-04-23 DIAGNOSIS — R69 Illness, unspecified: Secondary | ICD-10-CM | POA: Diagnosis not present

## 2021-04-23 DIAGNOSIS — E1169 Type 2 diabetes mellitus with other specified complication: Secondary | ICD-10-CM | POA: Diagnosis not present

## 2021-04-23 DIAGNOSIS — I10 Essential (primary) hypertension: Secondary | ICD-10-CM | POA: Diagnosis not present

## 2021-04-23 DIAGNOSIS — M797 Fibromyalgia: Secondary | ICD-10-CM | POA: Diagnosis not present

## 2021-04-23 DIAGNOSIS — M1990 Unspecified osteoarthritis, unspecified site: Secondary | ICD-10-CM | POA: Diagnosis not present

## 2021-04-25 ENCOUNTER — Ambulatory Visit: Admit: 2021-04-25 | Discharge: 2021-04-25 | Payer: MEDICARE | Attending: Surgery | Primary: Internal Medicine

## 2021-04-25 ENCOUNTER — Encounter (INDEPENDENT_AMBULATORY_CARE_PROVIDER_SITE_OTHER): Payer: Self-pay | Admitting: Family Medicine

## 2021-04-25 ENCOUNTER — Ambulatory Visit (INDEPENDENT_AMBULATORY_CARE_PROVIDER_SITE_OTHER): Payer: Medicare HMO | Admitting: Family Medicine

## 2021-04-25 ENCOUNTER — Other Ambulatory Visit: Payer: Self-pay

## 2021-04-25 VITALS — BP 151/90 | HR 95 | Temp 98.5°F | Ht 66.0 in | Wt 247.0 lb

## 2021-04-25 DIAGNOSIS — E1169 Type 2 diabetes mellitus with other specified complication: Secondary | ICD-10-CM | POA: Diagnosis not present

## 2021-04-25 DIAGNOSIS — Z6841 Body Mass Index (BMI) 40.0 and over, adult: Secondary | ICD-10-CM | POA: Diagnosis not present

## 2021-04-25 DIAGNOSIS — E1159 Type 2 diabetes mellitus with other circulatory complications: Secondary | ICD-10-CM | POA: Diagnosis not present

## 2021-04-25 DIAGNOSIS — I152 Hypertension secondary to endocrine disorders: Secondary | ICD-10-CM | POA: Diagnosis not present

## 2021-04-25 DIAGNOSIS — K429 Umbilical hernia without obstruction or gangrene: Secondary | ICD-10-CM

## 2021-04-25 MED ORDER — OZEMPIC (0.25 OR 0.5 MG/DOSE) 2 MG/1.5ML ~~LOC~~ SOPN: 0.5000 mg | PEN_INJECTOR | SUBCUTANEOUS | 0 refills | Status: DC

## 2021-04-25 NOTE — Progress Notes (Signed)
Chief Complaint:   OBESITY Shannon [name] is here to discuss her progress with her obesity treatment plan along with follow-up of her obesity related diagnoses. Parneet is on the Category 2 Plan and states she is following her eating plan approximately 50% of the time. Darly states she is doing 0 minutes 0 times per week.  Today's visit was #: 9 Starting weight: 265 lbs Starting date: 11/01/2020 Today's weight: 247 lbs Today's date: 04/25/2021 Total lbs lost to date: 18 lbs Total lbs lost since last in-office visit: 4 lbs  Interim History: Shannon [name] is losing weight to try to avoid back surgery. She likes the Category 2 plan and hunger is satisfied. She does not get all of the protein in. She does not like meat very much.  Subjective:   1. Type 2 diabetes mellitus with other specified complication, without long-term current use of insulin (HCC) Willistine's diabetes is well-controlled. Her last A1C was 6.1. Her CBGs was 112-113 post prandial. She denies hypoglycemia. She is on Ozempic 0.5 mg and tolerating it well.  Lab Results  Component Value Date   HGBA1C 6.1 (H) 04/03/2021   Lab Results  Component Value Date   CREATININE 0.67 10/28/2017   No results found for: INSULIN   2. Hypertension associated with type 2 diabetes mellitus ([place]) Shannon [name]'s BP is elevated today. She is in severe pain 9.5-10. The pain is in her right side of her back and leg. She is compliant with anti-hypertensives.  BP Readings from Last 3 Encounters:  04/25/21 (!) 151/90  04/03/21 (!) 142/83  02/21/21 (!) 148/92     Assessment/Plan:   1. Type 2 diabetes mellitus with other specified complication, without long-term current use of insulin (HCC) We will refill Ozempic 0.5 mg weekly today. - Semaglutide,0.25 or 0.'5MG'$ /DOS, (OZEMPIC, 0.25 OR 0.5 MG/DOSE,) 2 MG/1.5ML SOPN; Inject 0.5 mg into the skin once a week.  Dispense: 3 mL; Refill: 0  2. Hypertension associated with type 2 diabetes mellitus  ([place]) Shannon [name] will continue anti-hypertensives. Her blood pressure is likely elevated to pain.   3. Obesity: Current BMI 39.89 Shannon [name] is currently in the action stage of change. As such, her goal is to continue with weight loss efforts. She has agreed to the Category 2 Plan and the [place].   Handouts: Vegetarian plan, protein exchanges.  Exercise goals: No exercise has been prescribed at this time.  Behavioral modification strategies: increasing lean protein intake and meal planning and cooking strategies.  Chaundra has agreed to follow-up with our clinic in 2-3 weeks.  Objective:   Blood pressure (!) 151/90, pulse 95, temperature 98.5 F (36.9 C), height '5\' 6"'$  (1.676 m), weight 247 lb (112 kg), SpO2 97 %. Body mass index is 39.87 kg/m. Shannon [name] in significant pain. Had to stand during visit to alleviate the pain.  General: Cooperative, alert, well developed, in no acute distress. HEENT: Conjunctivae and lids unremarkable. Cardiovascular: Regular rhythm.  Lungs: Normal work of breathing. Neurologic: No focal deficits.   Lab Results  Component Value Date   CREATININE 0.67 10/28/2017   BUN 10 10/28/2017   NA 139 10/28/2017   K 3.3 (L) 10/28/2017   CL 105 10/28/2017   CO2 24 10/28/2017   Lab Results  Component Value Date   ALT 11 (L) 10/28/2017   AST 17 10/28/2017   ALKPHOS 102 10/28/2017   BILITOT 0.6 10/28/2017   Lab Results  Component Value Date   HGBA1C 6.1 (H) 04/03/2021   No results found for:  INSULIN Lab Results  Component Value Date   TSH 1.33 05/29/2015   No results found for: CHOL, HDL, LDLCALC, LDLDIRECT, TRIG, CHOLHDL No results found for: VD25OH Lab Results  Component Value Date   WBC 15.3 (H) 10/28/2017   HGB 14.3 10/28/2017   HCT 42.6 10/28/2017   MCV 87.8 10/28/2017   PLT 290 10/28/2017   No results found for: IRON, TIBC, FERRITIN  Attestation Statements:   Reviewed by clinician on day of visit: allergies, medications, problem  list, medical history, surgical history, family history, social history, and previous encounter notes.  I, Lizbeth Bark, RMA, am acting as Location manager for [employer], [place].   I have reviewed the above documentation for accuracy and completeness, and I agree with the above. -  Georgianne Fick, FNP

## 2021-04-25 NOTE — Progress Notes (Signed)
Advanced Laparoscopy  New Patient Intake  Date: 04/25/21  Patient Name: Regina Johns    HPI:  Regina Johns is a 65 y.o. female who presents with umbilical hernia.  First noticed a small lump near navel in February.  No pain and able to perform all regular activities.  She does golf 3-4 times per week.  Wears a binder when golfing or if lifting anything heavy.  Denies n/v, change in bowel habits, or color change to skin.     Previous abdomina surgery of hysterectomy.  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  H/o HTN, HLD.  Former smoker and admits to "vaping on occasion".  Social ETOH.  Denies recreational drug use.    I personally reviewed the patient intake form and discussed the ROS with the patient.  The ROS is negative except for what is listed in HPI.     Dr. Norval Gable OV note 03/26/21 reviewed    PMHx:   Past Medical History:   Diagnosis Date    Hyperlipidemia     Hypertension     Umbilical hernia without obstruction and without gangrene        PSHx:   Past Surgical History:   Procedure Laterality Date    CARDIAC SURGERY      age 2 VSD    HYSTERECTOMY (CERVIX STATUS UNKNOWN)         PFMHx: No family history on file.    ALL: No Known Allergies    MEDS:   Current Outpatient Medications on File Prior to Visit   Medication Sig Dispense Refill    citalopram (CELEXA) 10 MG tablet Take 20 mg by mouth every morning      losartan (COZAAR) 50 MG tablet Take 50 mg by mouth in the morning.      hydroCHLOROthiazide (MICROZIDE) 12.5 MG capsule Take 12.5 mg by mouth in the morning.      atorvastatin (LIPITOR) 10 MG tablet Take 10 mg by mouth in the morning.      ALPRAZolam (XANAX) 0.5 MG tablet Take 0.5 mg by mouth nightly as needed for Sleep. (Patient not taking: Reported on 04/25/2021)      cyclobenzaprine (FLEXERIL) 10 MG tablet Take 10 mg by mouth 3 times daily as needed for Muscle spasms (Patient not taking: Reported on 04/25/2021)       No current facility-administered medications on file prior to visit.       SOCIAL Hx:    Social History     Socioeconomic History    Marital status: Unknown     Spouse name: Not on file    Number of children: Not on file    Years of education: Not on file    Highest education level: Not on file   Occupational History    Not on file   Tobacco Use    Smoking status: Never    Smokeless tobacco: Never   Vaping Use    Vaping Use: Every day   Substance and Sexual Activity    Alcohol use: Yes    Drug use: Never    Sexual activity: Not Currently   Other Topics Concern    Not on file   Social History Narrative    Not on file     Social Determinants of Health     Financial Resource Strain: Not on file   Food Insecurity: Not on file   Transportation Needs: Not on file   Physical Activity: Not on file   Stress: Not  on file   Social Connections: Not on file   Intimate Partner Violence: Not on file   Housing Stability: Not on file     Physical Examination: BP 135/67 (Site: Right Upper Arm, Position: Sitting, Cuff Size: Large Adult)    Pulse 65    Temp 97.1 ??F (36.2 ??C) (Infrared)    Ht '5\' 4"'  (1.626 m)    Wt 155 lb 3.2 oz (70.4 kg)    SpO2 96%    BMI 26.64 kg/m??    She stands Height: '5\' 4"'  (162.6 cm) tall with a weight of Weight: 155 lb 3.2 oz (70.4 kg) , resulting in a BMI of Body mass index is 26.64 kg/m??..    General:  The patient is awake, alert, and oriented, and is in no apparent distress.      Head and Neck:  Normocephalic and atraumatic. Neck supple    Cardiac:  Regular rate and rhythm without evidence of murmur.    Respiratory:   No respiratory distress.  Clear to auscultation bilaterally.     Abdomen:   soft, non-tender, non-distended, normal bowel sounds, no masses or organomegaly     Extremities:   Ambulatory without assistance. No edema    Hernia:  Small nontender umbilical hernia-reducible, no skin changes    IMPRESSION/PLAN:   Regina Johns was seen today for new patient.    Diagnoses and all orders for this visit:    Umbilical hernia without obstruction and without gangrene     She will not need medical  risk stratification from his primary care physician. We will proceed with surgical intervention.  She wishes to wait until after golf season to proceed with repair.    I met with the patient today to discuss risks and benefits of laparoscopic umbilical hernia  repair with mesh, including, but not limited to mesh infection, wound infection, recurrence, chronic pain, bleeding, damage to intraabdominal organs or bladder, possible need for conversion to open surgery. The patient understands and wishes to proceed.     I discussed/counseled the patient regarding the risks and benefits of surgery as well as the preoperative and postoperative care plan for this patient.The patient was seen and examined independently and relevant data reviewed by myself. A full chart review was performed.          Hf  Electronically signed by Annamary Rummage, DO on 04/25/21

## 2021-05-02 ENCOUNTER — Other Ambulatory Visit: Payer: Self-pay | Admitting: Neurosurgery

## 2021-05-02 DIAGNOSIS — M4316 Spondylolisthesis, lumbar region: Secondary | ICD-10-CM

## 2021-05-16 ENCOUNTER — Other Ambulatory Visit: Payer: Self-pay

## 2021-05-16 ENCOUNTER — Encounter (INDEPENDENT_AMBULATORY_CARE_PROVIDER_SITE_OTHER): Payer: Self-pay | Admitting: Adult Health

## 2021-05-16 ENCOUNTER — Ambulatory Visit (INDEPENDENT_AMBULATORY_CARE_PROVIDER_SITE_OTHER): Payer: Medicare HMO | Admitting: Adult Health

## 2021-05-16 VITALS — BP 140/81 | HR 87 | Temp 97.9°F | Ht 66.0 in | Wt 246.0 lb

## 2021-05-16 DIAGNOSIS — I152 Hypertension secondary to endocrine disorders: Secondary | ICD-10-CM

## 2021-05-16 DIAGNOSIS — E1159 Type 2 diabetes mellitus with other circulatory complications: Secondary | ICD-10-CM | POA: Diagnosis not present

## 2021-05-16 DIAGNOSIS — E1169 Type 2 diabetes mellitus with other specified complication: Secondary | ICD-10-CM | POA: Diagnosis not present

## 2021-05-16 DIAGNOSIS — Z6841 Body Mass Index (BMI) 40.0 and over, adult: Secondary | ICD-10-CM

## 2021-05-16 MED ORDER — OZEMPIC (0.25 OR 0.5 MG/DOSE) 2 MG/1.5ML ~~LOC~~ SOPN: 0.5000 mg | PEN_INJECTOR | SUBCUTANEOUS | 0 refills | Status: DC

## 2021-05-16 NOTE — Progress Notes (Signed)
Chief Complaint:   OBESITY Gema is here to discuss her progress with her obesity treatment plan along with follow-up of her obesity related diagnoses. Shaylon is on the Category 2 Plan and the [place] and states she is following her eating plan approximately 50% of the time. Tezra states she is not exercising regularly.  Today's visit was #: 10 Starting weight: 265 lbs Starting date: 11/01/2020 Today's weight: 246 lbs Today's date: 05/16/2021 Total lbs lost to date: 19 lbs Total lbs lost since last in-office visit: 1 lb  Interim History: Kassady is experiencing bilateral knee and lumbar back pain at present. Knee pain currently rated 9/10 - described as throbbing/aching. Back pain with right-sided sciatica rated 10/10 - described as throbbing/aching. She has history of fibromyalgia. She has been using rest and OTC Tylenol Arthritis for relief.  Subjective:   1. Type 2 diabetes mellitus with other specified complication, without long-term current use of insulin ([place]) On 04/03/2021, A1c was 6.1 - at goal! She is on Ozempic 0.5 mg once weekly. She denies mass in neck, dysphagia, dyspepsia, or persistent hoarseness.   Lab Results  Component Value Date   HGBA1C 6.1 (H) 04/03/2021   Lab Results  Component Value Date   CREATININE 0.67 10/28/2017   2. Hypertension associated with type 2 diabetes mellitus (HCC) BP slightly elevated at office visit. She denies acute cardiac symptoms.  She denies tobacco/vape use. She reports significant bilateral knee and lumbar back pain at present- likely contributing to increase in pressure today. She is on Norvasc 5 mg daily, HCTZ 12.5 mg daily, Cozaar 100 mg daily- she reports 100% compliance with her antihypertensive regime.  BP Readings from Last 3 Encounters:  05/16/21 140/81  04/25/21 (!) 151/90  04/03/21 (!) 142/83   Assessment/Plan:   1. Type 2 diabetes mellitus with other specified complication, without long-term  current use of insulin (HCC) Refill Ozempic 0.5 mg subcutaneously weekly, as per below.  - Refill Semaglutide,0.25 or 0.'5MG'$ /DOS, (OZEMPIC, 0.25 OR 0.5 MG/DOSE,) 2 MG/1.5ML SOPN; Inject 0.5 mg into the skin once a week.  Dispense: 3 mL; Refill: 0  2. Hypertension associated with type 2 diabetes mellitus ([place]) Continue with pain control measures.   Continue current antihypertensive treatment. Limit Na+ in diet. Monitor BP closely.  3. Obesity: Current BMI 39.8  Kelahni is currently in the action stage of change. As such, her goal is to continue with weight loss efforts. She has agreed to the Category 2 Plan.   Exercise goals: No exercise has been prescribed at this time. Due to pain.  Behavioral modification strategies: increasing lean protein intake, decreasing simple carbohydrates, meal planning and cooking strategies, keeping healthy foods in the home, and planning for success.  Tatyanah has agreed to follow-up with our clinic in 3 weeks. She was informed of the importance of frequent follow-up visits to maximize her success with intensive lifestyle modifications for her multiple health conditions.   Objective:   Blood pressure 140/81, pulse 87, temperature 97.9 F (36.6 C), height '5\' 6"'$  (1.676 m), weight 246 lb (111.6 kg), SpO2 98 %. Body mass index is 39.71 kg/m.  General: Cooperative, alert, well developed, in no acute distress. HEENT: Conjunctivae and lids unremarkable. Cardiovascular: Regular rhythm.  Lungs: Normal work of breathing. Neurologic: No focal deficits.   Lab Results  Component Value Date   CREATININE 0.67 10/28/2017   BUN 10 10/28/2017   NA 139 10/28/2017   K 3.3 (L) 10/28/2017   CL 105 10/28/2017  CO2 24 10/28/2017   Lab Results  Component Value Date   ALT 11 (L) 10/28/2017   AST 17 10/28/2017   ALKPHOS 102 10/28/2017   BILITOT 0.6 10/28/2017   Lab Results  Component Value Date   HGBA1C 6.1 (H) 04/03/2021   Lab Results  Component Value Date    TSH 1.33 05/29/2015   Lab Results  Component Value Date   WBC 15.3 (H) 10/28/2017   HGB 14.3 10/28/2017   HCT 42.6 10/28/2017   MCV 87.8 10/28/2017   PLT 290 10/28/2017   Obesity Behavioral Intervention:   Approximately 15 minutes were spent on the discussion below.  ASK: We discussed the diagnosis of obesity with [name] today and Dianette agreed to give [place] permission to discuss obesity behavioral modification therapy today.  ASSESS: Dayanira has the diagnosis of obesity and her BMI today is 39.8. Astacia is in the action stage of change.   ADVISE: Vani was educated on the multiple health risks of obesity as well as the benefit of weight loss to improve her health. She was advised of the need for long term treatment and the importance of lifestyle modifications to improve her current health and to decrease her risk of future health problems.  AGREE: Multiple dietary modification options and treatment options were discussed and Yamilee agreed to follow the recommendations documented in the above note.  ARRANGE: Madalina was educated on the importance of frequent visits to treat obesity as outlined per CMS and USPSTF guidelines and agreed to schedule her next follow up appointment today.  Attestation Statements:   Reviewed by clinician on day of visit: allergies, medications, problem list, medical history, surgical history, family history, social history, and previous encounter notes.  I, Water quality scientist, CMA, am acting as Location manager for Mina Marble, NP.  I have reviewed the above documentation for accuracy and completeness, and I agree with the above. -  Maninder Deboer d. Renley Banwart, NP-C

## 2021-05-19 ENCOUNTER — Ambulatory Visit
Admission: RE | Admit: 2021-05-19 | Discharge: 2021-05-19 | Disposition: A | Discharge status: Home / Self Care | Payer: Medicare HMO | Source: Ambulatory Visit | Attending: Neurosurgery | Admitting: Neurosurgery

## 2021-05-19 ENCOUNTER — Other Ambulatory Visit: Payer: Self-pay

## 2021-05-19 DIAGNOSIS — M4316 Spondylolisthesis, lumbar region: Secondary | ICD-10-CM

## 2021-05-19 DIAGNOSIS — M48061 Spinal stenosis, lumbar region without neurogenic claudication: Secondary | ICD-10-CM | POA: Diagnosis not present

## 2021-05-21 DIAGNOSIS — M48062 Spinal stenosis, lumbar region with neurogenic claudication: Secondary | ICD-10-CM | POA: Diagnosis not present

## 2021-05-21 DIAGNOSIS — M4316 Spondylolisthesis, lumbar region: Secondary | ICD-10-CM | POA: Diagnosis not present

## 2021-05-21 DIAGNOSIS — I10 Essential (primary) hypertension: Secondary | ICD-10-CM | POA: Diagnosis not present

## 2021-05-21 DIAGNOSIS — Z6841 Body Mass Index (BMI) 40.0 and over, adult: Secondary | ICD-10-CM | POA: Diagnosis not present

## 2021-05-22 ENCOUNTER — Other Ambulatory Visit: Payer: Self-pay | Admitting: Neurosurgery

## 2021-06-06 ENCOUNTER — Other Ambulatory Visit: Payer: Self-pay

## 2021-06-06 ENCOUNTER — Encounter (INDEPENDENT_AMBULATORY_CARE_PROVIDER_SITE_OTHER): Payer: Self-pay | Admitting: Adult Health

## 2021-06-06 ENCOUNTER — Ambulatory Visit (INDEPENDENT_AMBULATORY_CARE_PROVIDER_SITE_OTHER): Payer: Medicare HMO | Admitting: Adult Health

## 2021-06-06 VITALS — BP 148/84 | HR 89 | Temp 97.9°F | Ht 66.0 in | Wt 251.0 lb

## 2021-06-06 DIAGNOSIS — Z6841 Body Mass Index (BMI) 40.0 and over, adult: Secondary | ICD-10-CM | POA: Diagnosis not present

## 2021-06-06 DIAGNOSIS — M545 Low back pain, unspecified: Secondary | ICD-10-CM

## 2021-06-06 DIAGNOSIS — E1169 Type 2 diabetes mellitus with other specified complication: Secondary | ICD-10-CM | POA: Diagnosis not present

## 2021-06-06 MED ORDER — SEMAGLUTIDE (1 MG/DOSE) 4 MG/3ML ~~LOC~~ SOPN
1.0000 mg | PEN_INJECTOR | SUBCUTANEOUS | 0 refills | Status: DC
Start: 2021-06-06 — End: 2022-09-08

## 2021-06-06 NOTE — Progress Notes (Signed)
Chief Complaint:   OBESITY Alpa is here to discuss her progress with her obesity treatment plan along with follow-up of her obesity related diagnoses. Yeimi is on the Category 2 Plan and states she is following her eating plan approximately 50% of the time. Oreatha states she is doing cardio for 10-15 minutes 3 times per week.  Today's visit was #: 11 Starting weight: 265 lbs Starting date: 11/01/2020 Today's weight: 251 lbs Today's date: 06/06/2021 Total lbs lost to date: 14 lbs Total lbs lost since last in-office visit: 0  Interim History: 06/28/21-Leyani is scheduled for lumbar 4-5, lumbar 5-sacral 1 posterior lumbar interbody fusion. She believes that she will sent to a Skilled Nursing facility after d/c from hospital after fusion procedure. She has had a migraine HA for the last 72 hours.   Due to stress from her upcoming surgery and headache - she reports an increase in stress eating. Her neighbor and daughter-in-law are available to help with her recovery s/p fusion.   Subjective:   1. Type 2 diabetes mellitus with other specified complication, without long-term current use of insulin ([place]) On 04/03/2021, A1c 6.1 - at goal.  Ambulatory fasting BG 110s. She is on Ozempic 0.5 mg once weekly.  She denies mass in neck, dysphagia, dyspepsia, or persistent hoarseness. Started on Ozempic on 11/15/2020.  Ozempic 1 mg caused N/constipation.  2. Lumbar back pain 06/28/21-Aydia is scheduled for lumbar 4-5, lumbar 5-sacral 1 posterior lumbar interbody fusion. She believes that she will sent to a Skilled Nursing facility after d/c from hospital after fusion procedure. Her neighbor and daughter-in-law are available to help with her recovery s/p fusion.   Assessment/Plan:   1. Type 2 diabetes mellitus with other specified complication, without long-term current use of insulin ([facility]) Increase Ozempic to 1 mg once weekly, as per below. If experiencing GI upset, decrease to 0.5  mg.  - Increase Semaglutide, 1 MG/DOSE, 4 MG/3ML SOPN; Inject 1 mg as directed once a week.  Dispense: 6 mL; Refill: 0  2. Lumbar back pain Increase protein intake.  3. Obesity: Current BMI 40.5  Astha is currently in the action stage of change. As such, her goal is to continue with weight loss efforts. She has agreed to the Category 2 Plan.    Increase protein to 20+ grams at breakfast, 30+ grams at lunch, and 35+ grams at dinner.  Pack 1 box a day.  Exercise goals:  As is.  Behavioral modification strategies: increasing lean protein intake, decreasing simple carbohydrates, meal planning and cooking strategies, keeping healthy foods in the home, and keeping a strict food journal.  Kordelia has agreed to follow-up with our clinic in 3 weeks. She was informed of the importance of frequent follow-up visits to maximize her success with intensive lifestyle modifications for her multiple health conditions.   Objective:   Blood pressure (!) 148/84, pulse 89, temperature 97.9 F (36.6 C), height 5\' 6"  (1.676 m), weight 251 lb (113.9 kg), SpO2 97 %. Body mass index is 40.51 kg/m.  General: Cooperative, alert, well developed, in no acute distress. HEENT: Conjunctivae and lids unremarkable. Cardiovascular: Regular rhythm.  Lungs: Normal work of breathing. Neurologic: No focal deficits.   Lab Results  Component Value Date   CREATININE 0.67 10/28/2017   BUN 10 10/28/2017   NA 139 10/28/2017   K 3.3 (L) 10/28/2017   CL 105 10/28/2017   CO2 24 10/28/2017   Lab Results  Component Value Date   ALT 11 (L)  10/28/2017   AST 17 10/28/2017   ALKPHOS 102 10/28/2017   BILITOT 0.6 10/28/2017   Lab Results  Component Value Date   HGBA1C 6.1 (H) 04/03/2021   Lab Results  Component Value Date   TSH 1.33 05/29/2015   Lab Results  Component Value Date   WBC 15.3 (H) 10/28/2017   HGB 14.3 10/28/2017   HCT 42.6 10/28/2017   MCV 87.8 10/28/2017   PLT 290 10/28/2017   Obesity  Behavioral Intervention:   Approximately 15 minutes were spent on the discussion below.  ASK: We discussed the diagnosis of obesity with [name] today and Simmie agreed to give [place] permission to discuss obesity behavioral modification therapy today.  ASSESS: Adriona has the diagnosis of obesity and her BMI today is 40.5. Shawnae is in the action stage of change.   ADVISE: Nalayah was educated on the multiple health risks of obesity as well as the benefit of weight loss to improve her health. She was advised of the need for long term treatment and the importance of lifestyle modifications to improve her current health and to decrease her risk of future health problems.  AGREE: Multiple dietary modification options and treatment options were discussed and Ariany agreed to follow the recommendations documented in the above note.  ARRANGE: Luan was educated on the importance of frequent visits to treat obesity as outlined per CMS and USPSTF guidelines and agreed to schedule her next follow up appointment today.  Attestation Statements:   Reviewed by clinician on day of visit: allergies, medications, problem list, medical history, surgical history, family history, social history, and previous encounter notes.  I, Water quality scientist, CMA, am acting as Location manager for Mina Marble, NP.  I have reviewed the above documentation for accuracy and completeness, and I agree with the above. -  Pernell Lenoir d. Mithra Spano, NP-C

## 2021-06-17 NOTE — Progress Notes (Signed)
Surgical Instructions    Your procedure is scheduled on Friday October 28th.  Report to [facility] Main Entrance "A" at 8 A.M., then check in with the Admitting office.  Call this number if you have problems the morning of surgery:  [phone]   If you have any questions prior to your surgery date call [phone]: Open Monday-Friday 8am-4pm    Remember:  Do not eat after midnight the night before your surgery  You may drink clear liquids until 7am the morning of your surgery.   Clear liquids allowed are: Water, Non-Citrus Juices (without pulp), Carbonated Beverages, Clear Tea, Black Coffee ONLY (NO MILK, CREAM OR POWDERED CREAMER of any kind), and Gatorade    Take these medicines the morning of surgery with A SIP OF WATER acidophilus (RISAQUAD) CAPS capsule amLODipine (NORVASC) 5 MG tablet DULoxetine (CYMBALTA) 20 MG capsule gabapentin (NEURONTIN) 300 MG capsule pantoprazole (PROTONIX) 20 MG tablet  IF NEEDED  acetaminophen (TYLENOL) 650 MG CR tablet meclizine (ANTIVERT) 25 MG tablet   As of today, STOP taking any Aspirin (unless otherwise instructed by your surgeon) Excedrin migraine, Aleve, Naproxen, Ibuprofen, Motrin, Advil, Goody's, BC's, all herbal medications, fish oil, and all vitamins.  WHAT DO I DO ABOUT MY DIABETES MEDICATION?   Do not take oral diabetes medicines (pills) the morning of surgery.   The day of surgery, do not take other diabetes injectables, including Semaglutide, 1 MG/DOSE, 4 MG/3ML SOPN, Byetta (exenatide), Bydureon (exenatide ER), Victoza (liraglutide), or Trulicity (dulaglutide).   HOW TO MANAGE YOUR DIABETES BEFORE AND AFTER SURGERY  Why is it important to control my blood sugar before and after surgery? Improving blood sugar levels before and after surgery helps healing and can limit problems. A way of improving blood sugar control is eating a healthy diet by:  Eating less sugar and carbohydrates  Increasing activity/exercise  Talking  with your doctor about reaching your blood sugar goals High blood sugars (greater than 180 mg/dL) can raise your risk of infections and slow your recovery, so you will need to focus on controlling your diabetes during the weeks before surgery. Make sure that the doctor who takes care of your diabetes knows about your planned surgery including the date and location.  How do I manage my blood sugar before surgery? Check your blood sugar at least 4 times a day, starting 2 days before surgery, to make sure that the level is not too high or low.  Check your blood sugar the morning of your surgery when you wake up and every 2 hours until you get to the Short Stay unit.  If your blood sugar is less than 70 mg/dL, you will need to treat for low blood sugar: Do not take insulin. Treat a low blood sugar (less than 70 mg/dL) with  cup of clear juice (cranberry or apple), 4 glucose tablets, OR glucose gel. Recheck blood sugar in 15 minutes after treatment (to make sure it is greater than 70 mg/dL). If your blood sugar is not greater than 70 mg/dL on recheck, call [phone] for further instructions. Report your blood sugar to the short stay nurse when you get to Short Stay.  If you are admitted to the hospital after surgery: Your blood sugar will be checked by the staff and you will probably be given insulin after surgery (instead of oral diabetes medicines) to make sure you have good blood sugar levels. The goal for blood sugar control after surgery is 80-180 mg/dL.      After  your COVID test   You are not required to quarantine however you are required to wear a well-fitting mask when you are out and around people not in your household.  If your mask becomes wet or soiled, replace with a new one.  Wash your hands often with soap and water for 20 seconds or clean your hands with an alcohol-based hand sanitizer that contains at least 60% alcohol.  Do not share personal items.  Notify your  provider: if you are in close contact with someone who has COVID  or if you develop a fever of 100.4 or greater, sneezing, cough, sore throat, shortness of breath or body aches.             Do not wear jewelry or makeup Do not wear lotions, powders, perfumes, or deodorant. Do not shave 48 hours prior to surgery.  . Do not bring valuables to the hospital. DO Not wear nail polish, gel polish, artificial nails, or any other type of covering on natural nails including finger and toenails. If patients have artificial nails, gel coating, etc. that need to be removed by a nail salon, please have this removed prior to surgery or surgery may need to be canceled/delayed if the surgeon/ anesthesia feels like the patient is unable to be adequately monitored.             [facility] is not responsible for any belongings or valuables.  Do NOT Smoke (Tobacco/Vaping)  24 hours prior to your procedure  If you use a CPAP at night, you may bring your mask for your overnight stay.   Contacts, glasses, hearing aids, dentures or partials may not be worn into surgery, please bring cases for these belongings   For patients admitted to the hospital, discharge time will be determined by your treatment team.   Patients discharged the day of surgery will not be allowed to drive home, and someone needs to stay with them for 24 hours.  NO VISITORS WILL BE ALLOWED IN PRE-OP WHERE PATIENTS ARE PREPPED FOR SURGERY.  ONLY 1 SUPPORT PERSON MAY BE PRESENT IN THE WAITING ROOM WHILE YOU ARE IN SURGERY.  IF YOU ARE TO BE ADMITTED, ONCE YOU ARE IN YOUR ROOM YOU WILL BE ALLOWED TWO (2) VISITORS. 1 (ONE) VISITOR MAY STAY OVERNIGHT BUT MUST ARRIVE TO THE ROOM BY 8pm.  Minor children may have two parents present. Special consideration for safety and communication needs will be reviewed on a case by case basis.  Special instructions:    Oral Hygiene is also important to reduce your risk of infection.  Remember - BRUSH YOUR TEETH  THE MORNING OF SURGERY WITH YOUR REGULAR TOOTHPASTE   [facility]- Preparing For Surgery  Before surgery, you can play an important role. Because skin is not sterile, your skin needs to be as free of germs as possible. You can reduce the number of germs on your skin by washing with CHG (chlorahexidine gluconate) Soap before surgery.  CHG is an antiseptic cleaner which kills germs and bonds with the skin to continue killing germs even after washing.     Please do not use if you have an allergy to CHG or antibacterial soaps. If your skin becomes reddened/irritated stop using the CHG.  Do not shave (including legs and underarms) for at least 48 hours prior to first CHG shower. It is OK to shave your face.  Please follow these instructions carefully.     Shower the [employer] SURGERY and the  MORNING OF SURGERY with CHG Soap.   If you chose to wash your hair, wash your hair first as usual with your normal shampoo. After you shampoo, rinse your hair and body thoroughly to remove the shampoo.  Then [employer] and genitals (private parts) with your normal soap and rinse thoroughly to remove soap.  After that Use CHG Soap as you would any other liquid soap. You can apply CHG directly to the skin and wash gently with a scrungie or a clean washcloth.   Apply the CHG Soap to your body ONLY FROM THE NECK DOWN.  Do not use on open wounds or open sores. Avoid contact with your eyes, ears, mouth and genitals (private parts). Wash Face and genitals (private parts)  with your normal soap.   Wash thoroughly, paying special attention to the area where your surgery will be performed.  Thoroughly rinse your body with warm water from the neck down.  DO NOT shower/wash with your normal soap after using and rinsing off the CHG Soap.  Pat yourself dry with a CLEAN TOWEL.  Wear CLEAN PAJAMAS to bed the night before surgery  Place CLEAN SHEETS on your bed the night before your surgery  DO NOT SLEEP WITH  PETS.   Day of Surgery:  Take a shower with CHG soap. Wear Clean/Comfortable clothing the morning of surgery Do not apply any deodorants/lotions.   Remember to brush your teeth WITH YOUR REGULAR TOOTHPASTE.   Please read over the following fact sheets that you were given.

## 2021-06-18 ENCOUNTER — Other Ambulatory Visit: Payer: Self-pay

## 2021-06-18 ENCOUNTER — Encounter (HOSPITAL_COMMUNITY): Payer: Self-pay

## 2021-06-18 ENCOUNTER — Encounter (HOSPITAL_COMMUNITY)
Admission: RE | Admit: 2021-06-18 | Discharge: 2021-06-18 | Disposition: A | Discharge status: Home / Self Care | Payer: Medicare HMO | Source: Ambulatory Visit | Attending: Neurosurgery | Admitting: Neurosurgery

## 2021-06-18 DIAGNOSIS — Z01812 Encounter for preprocedural laboratory examination: Secondary | ICD-10-CM | POA: Insufficient documentation

## 2021-06-18 LAB — CBC
HCT: 40.9 % (ref 36.0–46.0)
Hemoglobin: 13.1 g/dL (ref 12.0–15.0)
MCH: 28 pg (ref 26.0–34.0)
MCHC: 32 g/dL (ref 30.0–36.0)
MCV: 87.4 fL (ref 80.0–100.0)
Platelets: 300 10*3/uL (ref 150–400)
RBC: 4.68 MIL/uL (ref 3.87–5.11)
RDW: 13.6 % (ref 11.5–15.5)
WBC: 8.6 10*3/uL (ref 4.0–10.5)
nRBC: 0 % (ref 0.0–0.2)

## 2021-06-18 LAB — BASIC METABOLIC PANEL
Anion gap: 10 (ref 5–15)
BUN: 9 mg/dL (ref 8–23)
CO2: 26 mmol/L (ref 22–32)
Calcium: 9.3 mg/dL (ref 8.9–10.3)
Chloride: 104 mmol/L (ref 98–111)
Creatinine, Ser: 0.78 mg/dL (ref 0.44–1.00)
GFR, Estimated: >60 mL/min (ref >60)
Glucose, Bld: 107 mg/dL — ABNORMAL HIGH (ref 70–99)
Potassium: 3.2 mmol/L — ABNORMAL LOW (ref 3.5–5.1)
Sodium: 140 mmol/L (ref 135–145)

## 2021-06-18 LAB — GLUCOSE, CAPILLARY: Glucose-Capillary: 142 mg/dL — ABNORMAL HIGH (ref 70–99)

## 2021-06-18 LAB — TYPE AND SCREEN
ABO/RH(D): A POS
Antibody Screen: NEGATIVE

## 2021-06-18 LAB — HEMOGLOBIN A1C
Hgb A1c MFr Bld: 6.2 % — ABNORMAL HIGH (ref 4.8–5.6)
Mean Plasma Glucose: 131.24 mg/dL

## 2021-06-18 LAB — SURGICAL PCR SCREEN
MRSA, PCR: NEGATIVE
Staphylococcus aureus: NEGATIVE

## 2021-06-18 NOTE — Progress Notes (Signed)
PCP - Dr. Milagros Evener Cardiologist - Denies  Chest x-ray - Not indicated EKG - 11/01/20  Sleep Study - Denies   Diabetes TYPE II  CBG at PAT appt 142 Average Blood Sugar - 112-114 Checks Blood Sugar __1-2___ times a week  ERAS Protcol -Yes  COVID TEST- 06/25/21   Anesthesia review: No  Patient denies shortness of breath, fever, cough and chest pain at PAT appointment   All instructions explained to the patient, with a verbal understanding of the material. Patient agrees to go over the instructions while at home for a better understanding. Patient also instructed to wear a mask while in public after being tested for COVID-19. The opportunity to ask questions was provided.

## 2021-06-25 ENCOUNTER — Ambulatory Visit (INDEPENDENT_AMBULATORY_CARE_PROVIDER_SITE_OTHER): Payer: Medicare HMO | Admitting: Adult Health

## 2021-06-25 ENCOUNTER — Other Ambulatory Visit (HOSPITAL_COMMUNITY)
Admission: RE | Admit: 2021-06-25 | Discharge: 2021-06-25 | Disposition: A | Discharge status: Home / Self Care | Payer: Medicare HMO | Source: Ambulatory Visit | Attending: Neurosurgery | Admitting: Neurosurgery

## 2021-06-25 DIAGNOSIS — Z20822 Contact with and (suspected) exposure to covid-19: Secondary | ICD-10-CM | POA: Insufficient documentation

## 2021-06-25 DIAGNOSIS — Z01812 Encounter for preprocedural laboratory examination: Secondary | ICD-10-CM | POA: Insufficient documentation

## 2021-06-25 DIAGNOSIS — Z01818 Encounter for other preprocedural examination: Secondary | ICD-10-CM

## 2021-06-26 LAB — SARS CORONAVIRUS 2 (TAT 6-24 HRS): SARS Coronavirus 2: NEGATIVE

## 2021-06-27 NOTE — Anesthesia Preprocedure Evaluation (Addendum)
Anesthesia Evaluation  Patient identified by MRN, date of birth, ID band Patient awake    Reviewed: Allergy & Precautions, NPO status , Patient's Chart, lab work & pertinent test results  Airway Mallampati: II  TM Distance: >3 FB Neck ROM: Full    Dental no notable dental hx. (+) Edentulous Upper, Edentulous Lower   Pulmonary former smoker,    Pulmonary exam normal breath sounds clear to auscultation       Cardiovascular hypertension, Pt. on medications Normal cardiovascular exam Rhythm:Regular Rate:Normal     Neuro/Psych Depression  Neuromuscular disease    GI/Hepatic Neg liver ROS, GERD  Medicated and Controlled,  Endo/Other  diabetes, Type 2, Oral Hypoglycemic Agents  Renal/GU Lab Results      Component                Value               Date                      CREATININE               0.78                06/18/2021                BUN                      9                   06/18/2021                NA                       140                 06/18/2021                K                        3.2 (L)             06/18/2021                CL                       104                 06/18/2021                CO2                      26                  06/18/2021                Musculoskeletal  (+) Arthritis , Fibromyalgia -  Abdominal (+) + obese (BMI 41.32),   Peds  Hematology Lab Results      Component                Value               Date                      WBC  8.6                 06/18/2021                HGB                      13.1                06/18/2021                HCT                      40.9                06/18/2021                MCV                      87.4                06/18/2021                PLT                      300                 06/18/2021              Anesthesia Other Findings   Reproductive/Obstetrics                             Anesthesia Physical Anesthesia Plan  ASA: 3  Anesthesia Plan: General   Post-op Pain Management:    Induction: Intravenous  PONV Risk Score and Plan: 4 or greater and Treatment may vary due to age or medical condition, Ondansetron and Midazolam  Airway Management Planned: Oral ETT  Additional Equipment: None  Intra-op Plan:   Post-operative Plan: Extubation in OR  Informed Consent: I have reviewed the patients History and Physical, chart, labs and discussed the procedure including the risks, benefits and alternatives for the proposed anesthesia with the patient or authorized representative who has indicated his/her understanding and acceptance.     Dental advisory given  Plan Discussed with: CRNA and Anesthesiologist  Anesthesia Plan Comments: ([place] )        Anesthesia Quick Evaluation

## 2021-06-28 ENCOUNTER — Inpatient Hospital Stay (HOSPITAL_COMMUNITY): Payer: Medicare HMO | Admitting: Anesthesiology

## 2021-06-28 ENCOUNTER — Inpatient Hospital Stay (HOSPITAL_COMMUNITY): Payer: Medicare HMO

## 2021-06-28 ENCOUNTER — Inpatient Hospital Stay (HOSPITAL_COMMUNITY)
Admission: RE | Admit: 2021-06-28 | Discharge: 2021-07-05 | DRG: 460 | Disposition: A | Discharge status: Home Health | Payer: Medicare HMO | Attending: Neurosurgery | Admitting: Neurosurgery

## 2021-06-28 ENCOUNTER — Encounter (HOSPITAL_COMMUNITY): Payer: Self-pay | Admitting: Neurosurgery

## 2021-06-28 ENCOUNTER — Encounter (HOSPITAL_COMMUNITY)
Admission: RE | Disposition: A | Discharge status: Home / Self Care | Payer: Self-pay | Source: Ambulatory Visit | Attending: Neurosurgery

## 2021-06-28 ENCOUNTER — Other Ambulatory Visit: Payer: Self-pay

## 2021-06-28 DIAGNOSIS — Z90710 Acquired absence of both cervix and uterus: Secondary | ICD-10-CM | POA: Diagnosis not present

## 2021-06-28 DIAGNOSIS — Z23 Encounter for immunization: Secondary | ICD-10-CM | POA: Diagnosis not present

## 2021-06-28 DIAGNOSIS — Z7984 Long term (current) use of oral hypoglycemic drugs: Secondary | ICD-10-CM | POA: Diagnosis not present

## 2021-06-28 DIAGNOSIS — Z833 Family history of diabetes mellitus: Secondary | ICD-10-CM

## 2021-06-28 DIAGNOSIS — I10 Essential (primary) hypertension: Secondary | ICD-10-CM | POA: Diagnosis present

## 2021-06-28 DIAGNOSIS — R531 Weakness: Secondary | ICD-10-CM | POA: Diagnosis not present

## 2021-06-28 DIAGNOSIS — E669 Obesity, unspecified: Secondary | ICD-10-CM | POA: Diagnosis present

## 2021-06-28 DIAGNOSIS — Z6839 Body mass index (BMI) 39.0-39.9, adult: Secondary | ICD-10-CM | POA: Diagnosis not present

## 2021-06-28 DIAGNOSIS — M4326 Fusion of spine, lumbar region: Secondary | ICD-10-CM | POA: Diagnosis not present

## 2021-06-28 DIAGNOSIS — E559 Vitamin D deficiency, unspecified: Secondary | ICD-10-CM | POA: Diagnosis not present

## 2021-06-28 DIAGNOSIS — M47816 Spondylosis without myelopathy or radiculopathy, lumbar region: Secondary | ICD-10-CM | POA: Diagnosis not present

## 2021-06-28 DIAGNOSIS — M4316 Spondylolisthesis, lumbar region: Secondary | ICD-10-CM | POA: Diagnosis present

## 2021-06-28 DIAGNOSIS — J9811 Atelectasis: Secondary | ICD-10-CM | POA: Diagnosis not present

## 2021-06-28 DIAGNOSIS — Z79899 Other long term (current) drug therapy: Secondary | ICD-10-CM | POA: Diagnosis not present

## 2021-06-28 DIAGNOSIS — Z8249 Family history of ischemic heart disease and other diseases of the circulatory system: Secondary | ICD-10-CM

## 2021-06-28 DIAGNOSIS — M4807 Spinal stenosis, lumbosacral region: Secondary | ICD-10-CM | POA: Diagnosis not present

## 2021-06-28 DIAGNOSIS — E119 Type 2 diabetes mellitus without complications: Secondary | ICD-10-CM | POA: Diagnosis present

## 2021-06-28 DIAGNOSIS — M48061 Spinal stenosis, lumbar region without neurogenic claudication: Secondary | ICD-10-CM | POA: Diagnosis not present

## 2021-06-28 DIAGNOSIS — M47896 Other spondylosis, lumbar region: Secondary | ICD-10-CM | POA: Diagnosis not present

## 2021-06-28 DIAGNOSIS — M1909 Primary osteoarthritis, other specified site: Secondary | ICD-10-CM | POA: Diagnosis not present

## 2021-06-28 DIAGNOSIS — Z87891 Personal history of nicotine dependence: Secondary | ICD-10-CM

## 2021-06-28 DIAGNOSIS — Z9049 Acquired absence of other specified parts of digestive tract: Secondary | ICD-10-CM

## 2021-06-28 DIAGNOSIS — Z981 Arthrodesis status: Secondary | ICD-10-CM | POA: Diagnosis not present

## 2021-06-28 DIAGNOSIS — Z20822 Contact with and (suspected) exposure to covid-19: Secondary | ICD-10-CM | POA: Diagnosis present

## 2021-06-28 DIAGNOSIS — Z09 Encounter for follow-up examination after completed treatment for conditions other than malignant neoplasm: Secondary | ICD-10-CM

## 2021-06-28 DIAGNOSIS — Z419 Encounter for procedure for purposes other than remedying health state, unspecified: Secondary | ICD-10-CM

## 2021-06-28 LAB — POCT I-STAT 7, (LYTES, BLD GAS, ICA,H+H)
Acid-Base Excess: 3 mmol/L — ABNORMAL HIGH (ref 0.0–2.0)
Bicarbonate: 28 mmol/L (ref 20.0–28.0)
Calcium, Ion: 1.18 mmol/L (ref 1.15–1.40)
HCT: 36 % (ref 36.0–46.0)
Hemoglobin: 12.2 g/dL (ref 12.0–15.0)
O2 Saturation: 100 %
Patient temperature: 36
Potassium: 2.9 mmol/L — ABNORMAL LOW (ref 3.5–5.1)
Sodium: 140 mmol/L (ref 135–145)
TCO2: 29 mmol/L (ref 22–32)
pCO2 arterial: 40.2 mmHg (ref 32.0–48.0)
pH, Arterial: 7.446 (ref 7.350–7.450)
pO2, Arterial: 191 mmHg — ABNORMAL HIGH (ref 83.0–108.0)

## 2021-06-28 LAB — ABO/RH: ABO/RH(D): A POS

## 2021-06-28 LAB — GLUCOSE, CAPILLARY
Glucose-Capillary: 112 mg/dL — ABNORMAL HIGH (ref 70–99)
Glucose-Capillary: 90 mg/dL (ref 70–99)
Glucose-Capillary: 91 mg/dL (ref 70–99)
Glucose-Capillary: 169 mg/dL — ABNORMAL HIGH (ref 70–99)

## 2021-06-28 SURGERY — POSTERIOR LUMBAR FUSION 2 LEVEL
Anesthesia: General | Site: Back

## 2021-06-28 MED ORDER — HYDROMORPHONE HCL 1 MG/ML IJ SOLN
INTRAMUSCULAR | Status: DC | PRN
  Administered 2021-06-28: .5 mg via INTRAVENOUS

## 2021-06-28 MED ORDER — LOSARTAN POTASSIUM 50 MG PO TABS
100.0000 mg | ORAL_TABLET | Freq: Every morning | ORAL | Status: DC
  Administered 2021-06-29 – 2021-07-05 (×7): 100 mg via ORAL
  Filled 2021-06-28 (×8): qty 2

## 2021-06-28 MED ORDER — ONDANSETRON HCL 4 MG PO TABS: 4.0000 mg | ORAL_TABLET | Freq: Four times a day (QID) | ORAL | Status: DC | PRN

## 2021-06-28 MED ORDER — OXYCODONE HCL ER 10 MG PO T12A
10.0000 mg | EXTENDED_RELEASE_TABLET | Freq: Two times a day (BID) | ORAL | Status: DC
  Administered 2021-06-29 – 2021-07-05 (×13): 10 mg via ORAL
  Filled 2021-06-28 (×13): qty 1

## 2021-06-28 MED ORDER — ACETAMINOPHEN 10 MG/ML IV SOLN: 1000.0000 mg | Freq: Once | INTRAVENOUS | Status: DC | PRN

## 2021-06-28 MED ORDER — DEXAMETHASONE SODIUM PHOSPHATE 10 MG/ML IJ SOLN
INTRAMUSCULAR | Status: DC | PRN
  Administered 2021-06-28: 10 mg via INTRAVENOUS

## 2021-06-28 MED ORDER — CEFAZOLIN SODIUM-DEXTROSE 2-4 GM/100ML-% IV SOLN
2.0000 g | INTRAVENOUS | Status: AC
  Administered 2021-06-28 (×2): 2 g via INTRAVENOUS

## 2021-06-28 MED ORDER — ACETAMINOPHEN 500 MG PO TABS
1000.0000 mg | ORAL_TABLET | Freq: Four times a day (QID) | ORAL | Status: AC
  Administered 2021-06-29 (×3): 1000 mg via ORAL
  Administered 2021-06-29: 500 mg via ORAL
  Filled 2021-06-28 (×4): qty 2

## 2021-06-28 MED ORDER — KETAMINE HCL 50 MG/5ML IJ SOSY
PREFILLED_SYRINGE | INTRAMUSCULAR | Status: AC
  Filled 2021-06-28: qty 5

## 2021-06-28 MED ORDER — ORAL CARE MOUTH RINSE: 15.0000 mL | Freq: Once | OROMUCOSAL | Status: AC

## 2021-06-28 MED ORDER — ACETAMINOPHEN 650 MG RE SUPP: 650.0000 mg | RECTAL | Status: DC | PRN

## 2021-06-28 MED ORDER — MIDAZOLAM HCL 5 MG/5ML IJ SOLN
INTRAMUSCULAR | Status: DC | PRN
  Administered 2021-06-28: 2 mg via INTRAVENOUS

## 2021-06-28 MED ORDER — THROMBIN 20000 UNITS EX SOLR
CUTANEOUS | Status: DC | PRN
  Administered 2021-06-28: 20 mL via TOPICAL

## 2021-06-28 MED ORDER — LACTATED RINGERS IV SOLN: INTRAVENOUS | Status: DC | PRN

## 2021-06-28 MED ORDER — ONDANSETRON HCL 4 MG/2ML IJ SOLN
INTRAMUSCULAR | Status: AC
  Filled 2021-06-28: qty 2

## 2021-06-28 MED ORDER — SENNOSIDES-DOCUSATE SODIUM 8.6-50 MG PO TABS: 1.0000 | ORAL_TABLET | Freq: Every evening | ORAL | Status: DC | PRN

## 2021-06-28 MED ORDER — HYDROMORPHONE HCL 1 MG/ML IJ SOLN
0.2500 mg | INTRAMUSCULAR | Status: DC | PRN
  Administered 2021-06-28 (×2): 0.5 mg via INTRAVENOUS

## 2021-06-28 MED ORDER — PHENYLEPHRINE HCL-NACL 20-0.9 MG/250ML-% IV SOLN
INTRAVENOUS | Status: DC | PRN
  Administered 2021-06-28: 25 ug/min via INTRAVENOUS

## 2021-06-28 MED ORDER — EPHEDRINE 5 MG/ML INJ
INTRAVENOUS | Status: AC
  Filled 2021-06-28: qty 5

## 2021-06-28 MED ORDER — BISACODYL 5 MG PO TBEC
5.0000 mg | DELAYED_RELEASE_TABLET | Freq: Every day | ORAL | Status: DC | PRN
  Administered 2021-07-04: 5 mg via ORAL
  Filled 2021-06-28 (×2): qty 1

## 2021-06-28 MED ORDER — BUPIVACAINE HCL (PF) 0.5 % IJ SOLN
INTRAMUSCULAR | Status: DC | PRN
Start: 2021-06-28 — End: 2021-06-28
  Administered 2021-06-28: 30 mL

## 2021-06-28 MED ORDER — PHENYLEPHRINE 40 MCG/ML (10ML) SYRINGE FOR IV PUSH (FOR BLOOD PRESSURE SUPPORT)
PREFILLED_SYRINGE | INTRAVENOUS | Status: AC
  Filled 2021-06-28: qty 10

## 2021-06-28 MED ORDER — MENTHOL 3 MG MT LOZG
1.0000 | LOZENGE | OROMUCOSAL | Status: DC | PRN
  Filled 2021-06-28: qty 9

## 2021-06-28 MED ORDER — PHENOL 1.4 % MT LIQD: 1.0000 | OROMUCOSAL | Status: DC | PRN

## 2021-06-28 MED ORDER — OXYCODONE HCL 5 MG PO TABS: 5.0000 mg | ORAL_TABLET | Freq: Once | ORAL | Status: DC | PRN

## 2021-06-28 MED ORDER — POTASSIUM CHLORIDE CRYS ER 20 MEQ PO TBCR
20.0000 meq | EXTENDED_RELEASE_TABLET | Freq: Every day | ORAL | Status: DC
  Administered 2021-06-29 – 2021-07-05 (×7): 20 meq via ORAL
  Filled 2021-06-28 (×7): qty 1

## 2021-06-28 MED ORDER — PROPOFOL 10 MG/ML IV BOLUS
INTRAVENOUS | Status: AC
  Filled 2021-06-28: qty 20

## 2021-06-28 MED ORDER — ROCURONIUM BROMIDE 10 MG/ML (PF) SYRINGE
PREFILLED_SYRINGE | INTRAVENOUS | Status: DC | PRN
  Administered 2021-06-28: 80 mg via INTRAVENOUS
  Administered 2021-06-28 (×3): 20 mg via INTRAVENOUS

## 2021-06-28 MED ORDER — ACETAMINOPHEN 325 MG PO TABS
650.0000 mg | ORAL_TABLET | ORAL | Status: DC | PRN
  Administered 2021-07-02: 650 mg via ORAL
  Filled 2021-06-28: qty 2

## 2021-06-28 MED ORDER — CHLORHEXIDINE GLUCONATE CLOTH 2 % EX PADS: 6.0000 | MEDICATED_PAD | Freq: Once | CUTANEOUS | Status: DC

## 2021-06-28 MED ORDER — POTASSIUM CHLORIDE IN NACL 20-0.9 MEQ/L-% IV SOLN
INTRAVENOUS | Status: DC
  Filled 2021-06-28 (×2): qty 1000

## 2021-06-28 MED ORDER — HYDROMORPHONE HCL 1 MG/ML IJ SOLN
INTRAMUSCULAR | Status: AC
  Filled 2021-06-28: qty 0.5

## 2021-06-28 MED ORDER — OXYCODONE HCL 5 MG/5ML PO SOLN: 5.0000 mg | Freq: Once | ORAL | Status: DC | PRN

## 2021-06-28 MED ORDER — ROPINIROLE HCL 1 MG PO TABS
0.5000 mg | ORAL_TABLET | Freq: Every day | ORAL | Status: DC
  Administered 2021-06-29 – 2021-07-04 (×6): 0.5 mg via ORAL
  Filled 2021-06-28 (×6): qty 1

## 2021-06-28 MED ORDER — DULOXETINE HCL 20 MG PO CPEP
20.0000 mg | ORAL_CAPSULE | Freq: Every day | ORAL | Status: DC
  Administered 2021-06-29 – 2021-07-05 (×8): 20 mg via ORAL
  Filled 2021-06-28 (×8): qty 1

## 2021-06-28 MED ORDER — PANTOPRAZOLE SODIUM 20 MG PO TBEC
20.0000 mg | DELAYED_RELEASE_TABLET | Freq: Every day | ORAL | Status: DC
  Administered 2021-06-29 – 2021-07-05 (×7): 20 mg via ORAL
  Filled 2021-06-28 (×7): qty 1

## 2021-06-28 MED ORDER — SUGAMMADEX SODIUM 200 MG/2ML IV SOLN
INTRAVENOUS | Status: DC | PRN
  Administered 2021-06-28: 400 mg via INTRAVENOUS

## 2021-06-28 MED ORDER — SODIUM CHLORIDE 0.9% FLUSH: 3.0000 mL | INTRAVENOUS | Status: DC | PRN

## 2021-06-28 MED ORDER — FENTANYL CITRATE (PF) 250 MCG/5ML IJ SOLN
INTRAMUSCULAR | Status: AC
  Filled 2021-06-28: qty 5

## 2021-06-28 MED ORDER — HYDROCHLOROTHIAZIDE 12.5 MG PO TABS
12.5000 mg | ORAL_TABLET | Freq: Every morning | ORAL | Status: DC
  Administered 2021-06-30 – 2021-07-05 (×6): 12.5 mg via ORAL
  Filled 2021-06-28 (×5): qty 1

## 2021-06-28 MED ORDER — HYDROMORPHONE HCL 1 MG/ML IJ SOLN
INTRAMUSCULAR | Status: AC
  Filled 2021-06-28: qty 1

## 2021-06-28 MED ORDER — DEXAMETHASONE SODIUM PHOSPHATE 10 MG/ML IJ SOLN
INTRAMUSCULAR | Status: AC
  Filled 2021-06-28: qty 1

## 2021-06-28 MED ORDER — KETAMINE HCL 10 MG/ML IJ SOLN
INTRAMUSCULAR | Status: DC | PRN
  Administered 2021-06-28: 50 mg via INTRAVENOUS

## 2021-06-28 MED ORDER — CEFAZOLIN SODIUM-DEXTROSE 2-4 GM/100ML-% IV SOLN
INTRAVENOUS | Status: AC
  Filled 2021-06-28: qty 100

## 2021-06-28 MED ORDER — 0.9 % SODIUM CHLORIDE (POUR BTL) OPTIME
TOPICAL | Status: DC | PRN
  Administered 2021-06-28 (×2): 1000 mL

## 2021-06-28 MED ORDER — ROCURONIUM BROMIDE 10 MG/ML (PF) SYRINGE
PREFILLED_SYRINGE | INTRAVENOUS | Status: AC
  Filled 2021-06-28: qty 20

## 2021-06-28 MED ORDER — LIDOCAINE 2% (20 MG/ML) 5 ML SYRINGE
INTRAMUSCULAR | Status: AC
  Filled 2021-06-28: qty 5

## 2021-06-28 MED ORDER — OXYCODONE HCL 5 MG PO TABS
5.0000 mg | ORAL_TABLET | ORAL | Status: DC | PRN
  Administered 2021-06-29 – 2021-07-01 (×2): 5 mg via ORAL
  Filled 2021-06-28 (×2): qty 1

## 2021-06-28 MED ORDER — DIAZEPAM 5 MG PO TABS
5.0000 mg | ORAL_TABLET | Freq: Four times a day (QID) | ORAL | Status: DC | PRN
  Administered 2021-06-30 – 2021-07-05 (×7): 5 mg via ORAL
  Filled 2021-06-28 (×8): qty 1

## 2021-06-28 MED ORDER — AMISULPRIDE (ANTIEMETIC) 5 MG/2ML IV SOLN
INTRAVENOUS | Status: AC
  Filled 2021-06-28: qty 4

## 2021-06-28 MED ORDER — SODIUM CHLORIDE 0.9% FLUSH
3.0000 mL | Freq: Two times a day (BID) | INTRAVENOUS | Status: DC
  Administered 2021-06-28 – 2021-07-04 (×12): 3 mL via INTRAVENOUS

## 2021-06-28 MED ORDER — ONDANSETRON HCL 4 MG/2ML IJ SOLN
INTRAMUSCULAR | Status: DC | PRN
  Administered 2021-06-28: 4 mg via INTRAVENOUS

## 2021-06-28 MED ORDER — SENNA 8.6 MG PO TABS
1.0000 | ORAL_TABLET | Freq: Two times a day (BID) | ORAL | Status: DC
  Administered 2021-06-29 – 2021-07-05 (×13): 8.6 mg via ORAL
  Filled 2021-06-28 (×14): qty 1

## 2021-06-28 MED ORDER — SODIUM CHLORIDE 0.9 % IV SOLN: 250.0000 mL | INTRAVENOUS | Status: DC

## 2021-06-28 MED ORDER — FENTANYL CITRATE (PF) 100 MCG/2ML IJ SOLN
INTRAMUSCULAR | Status: DC | PRN
  Administered 2021-06-28 (×2): 100 ug via INTRAVENOUS
  Administered 2021-06-28: 50 ug via INTRAVENOUS
  Administered 2021-06-28: 100 ug via INTRAVENOUS

## 2021-06-28 MED ORDER — PHENYLEPHRINE 40 MCG/ML (10ML) SYRINGE FOR IV PUSH (FOR BLOOD PRESSURE SUPPORT)
PREFILLED_SYRINGE | INTRAVENOUS | Status: DC | PRN
  Administered 2021-06-28 (×2): 80 ug via INTRAVENOUS

## 2021-06-28 MED ORDER — MIDAZOLAM HCL 2 MG/2ML IJ SOLN
INTRAMUSCULAR | Status: AC
  Filled 2021-06-28: qty 2

## 2021-06-28 MED ORDER — AMLODIPINE BESYLATE 5 MG PO TABS
5.0000 mg | ORAL_TABLET | Freq: Every day | ORAL | Status: DC
  Administered 2021-06-29 – 2021-07-05 (×7): 5 mg via ORAL
  Filled 2021-06-28 (×7): qty 1

## 2021-06-28 MED ORDER — CHLORHEXIDINE GLUCONATE 0.12 % MT SOLN
OROMUCOSAL | Status: AC
  Administered 2021-06-28: 15 mL via OROMUCOSAL
  Filled 2021-06-28: qty 15

## 2021-06-28 MED ORDER — MECLIZINE HCL 12.5 MG PO TABS: 25.0000 mg | ORAL_TABLET | Freq: Two times a day (BID) | ORAL | Status: DC | PRN

## 2021-06-28 MED ORDER — ONDANSETRON HCL 4 MG/2ML IJ SOLN: 4.0000 mg | Freq: Once | INTRAMUSCULAR | Status: DC | PRN

## 2021-06-28 MED ORDER — THROMBIN 20000 UNITS EX SOLR
CUTANEOUS | Status: AC
  Filled 2021-06-28: qty 20000

## 2021-06-28 MED ORDER — RISAQUAD PO CAPS
1.0000 | ORAL_CAPSULE | Freq: Every day | ORAL | Status: DC
  Administered 2021-06-29 – 2021-07-05 (×7): 1 via ORAL
  Filled 2021-06-28 (×7): qty 1

## 2021-06-28 MED ORDER — GABAPENTIN 300 MG PO CAPS
300.0000 mg | ORAL_CAPSULE | Freq: Every day | ORAL | Status: DC
  Administered 2021-06-29 – 2021-07-04 (×6): 300 mg via ORAL
  Filled 2021-06-28 (×7): qty 1

## 2021-06-28 MED ORDER — BUPIVACAINE HCL (PF) 0.5 % IJ SOLN
INTRAMUSCULAR | Status: AC
  Filled 2021-06-28: qty 30

## 2021-06-28 MED ORDER — LACTATED RINGERS IV SOLN: INTRAVENOUS | Status: DC

## 2021-06-28 MED ORDER — VITAMIN D 25 MCG (1000 UNIT) PO TABS
5000.0000 [IU] | ORAL_TABLET | Freq: Every day | ORAL | Status: DC
  Administered 2021-06-29 – 2021-07-05 (×7): 5000 [IU] via ORAL
  Filled 2021-06-28 (×7): qty 5

## 2021-06-28 MED ORDER — OXYCODONE HCL 5 MG PO TABS
10.0000 mg | ORAL_TABLET | ORAL | Status: DC | PRN
  Administered 2021-06-29 – 2021-07-05 (×6): 10 mg via ORAL
  Filled 2021-06-28 (×7): qty 2

## 2021-06-28 MED ORDER — CHLORHEXIDINE GLUCONATE 0.12 % MT SOLN: 15.0000 mL | Freq: Once | OROMUCOSAL | Status: AC

## 2021-06-28 MED ORDER — HEPARIN SODIUM (PORCINE) 5000 UNIT/ML IJ SOLN
5000.0000 [IU] | Freq: Three times a day (TID) | INTRAMUSCULAR | Status: DC
  Administered 2021-06-29 – 2021-07-05 (×18): 5000 [IU] via SUBCUTANEOUS
  Filled 2021-06-28 (×16): qty 1

## 2021-06-28 MED ORDER — MORPHINE SULFATE (PF) 2 MG/ML IV SOLN
2.0000 mg | INTRAVENOUS | Status: DC | PRN
  Administered 2021-06-29 – 2021-06-30 (×4): 2 mg via INTRAVENOUS
  Filled 2021-06-28 (×4): qty 1

## 2021-06-28 MED ORDER — FLEET ENEMA 7-19 GM/118ML RE ENEM: 1.0000 | ENEMA | Freq: Once | RECTAL | Status: DC | PRN

## 2021-06-28 MED ORDER — LIDOCAINE 2% (20 MG/ML) 5 ML SYRINGE
INTRAMUSCULAR | Status: DC | PRN
  Administered 2021-06-28: 50 mg via INTRAVENOUS

## 2021-06-28 MED ORDER — LIDOCAINE-EPINEPHRINE (PF) 1 %-1:200000 IJ SOLN
INTRAMUSCULAR | Status: DC | PRN
  Administered 2021-06-28: 10 mL

## 2021-06-28 MED ORDER — ONDANSETRON HCL 4 MG/2ML IJ SOLN
4.0000 mg | Freq: Four times a day (QID) | INTRAMUSCULAR | Status: DC | PRN
Start: 2021-06-28 — End: 2021-07-06
  Administered 2021-06-29 – 2021-06-30 (×4): 4 mg via INTRAVENOUS
  Filled 2021-06-28 (×4): qty 2

## 2021-06-28 MED ORDER — PROPOFOL 10 MG/ML IV BOLUS
INTRAVENOUS | Status: DC | PRN
  Administered 2021-06-28: 50 mg via INTRAVENOUS
  Administered 2021-06-28: 150 mg via INTRAVENOUS

## 2021-06-28 MED ORDER — ZOLPIDEM TARTRATE 5 MG PO TABS: 5.0000 mg | ORAL_TABLET | Freq: Every evening | ORAL | Status: DC | PRN

## 2021-06-28 MED ORDER — AMISULPRIDE (ANTIEMETIC) 5 MG/2ML IV SOLN
10.0000 mg | Freq: Once | INTRAVENOUS | Status: AC | PRN
  Administered 2021-06-28: 10 mg via INTRAVENOUS

## 2021-06-28 SURGICAL SUPPLY — 72 items
ADH SKN CLS APL DERMABOND .7 (GAUZE/BANDAGES/DRESSINGS) ×1
APL SKNCLS STERI-STRIP NONHPOA (GAUZE/BANDAGES/DRESSINGS)
BAG COUNTER SPONGE SURGICOUNT (BAG) ×2 IMPLANT
BAG SPNG CNTER NS LX DISP (BAG) ×1
BASKET BONE COLLECTION (BASKET) ×1 IMPLANT
BENZOIN TINCTURE PRP APPL 2/3 (GAUZE/BANDAGES/DRESSINGS) IMPLANT
BIT DRILL PLIF MAS DISP 5.5MM (DRILL) IMPLANT
BLADE CLIPPER SURG (BLADE) IMPLANT
BUR MATCHSTICK NEURO 3.0 LAGG (BURR) ×2 IMPLANT
BUR PRECISION FLUTE 5.0 (BURR) ×2 IMPLANT
CAGE CONVEX CASCADIA 8.5X22X10 (Cage) ×2 IMPLANT
CAGE CONVEX CASCADIA 8.5X22X8 (Cage) ×2 IMPLANT
CANISTER SUCT 3000ML PPV (MISCELLANEOUS) ×2 IMPLANT
CAP RELINE MOD TULIP RMM (Cap) ×6 IMPLANT
CARTRIDGE OIL MAESTRO DRILL (MISCELLANEOUS) ×1 IMPLANT
CNTNR URN SCR LID CUP LEK RST (MISCELLANEOUS) ×1 IMPLANT
CONT SPEC 4OZ STRL OR WHT (MISCELLANEOUS) ×2
COVER BACK TABLE 60X90IN (DRAPES) IMPLANT
DECANTER SPIKE VIAL GLASS SM (MISCELLANEOUS) ×2 IMPLANT
DERMABOND ADVANCED (GAUZE/BANDAGES/DRESSINGS) ×1
DERMABOND ADVANCED .7 DNX12 (GAUZE/BANDAGES/DRESSINGS) ×1 IMPLANT
DIFFUSER DRILL AIR PNEUMATIC (MISCELLANEOUS) ×2 IMPLANT
DRAPE C-ARM 42X72 X-RAY (DRAPES) ×4 IMPLANT
DRAPE C-ARMOR (DRAPES) IMPLANT
DRAPE LAPAROTOMY 100X72X124 (DRAPES) ×2 IMPLANT
DRAPE SURG 17X23 STRL (DRAPES) ×2 IMPLANT
DRILL PLIF MAS DISP 5.5MM (DRILL) ×2
DRSG OPSITE POSTOP 4X6 (GAUZE/BANDAGES/DRESSINGS) ×1 IMPLANT
DURAPREP 26ML APPLICATOR (WOUND CARE) ×2 IMPLANT
ELECT REM PT RETURN 9FT ADLT (ELECTROSURGICAL) ×2
ELECTRODE REM PT RTRN 9FT ADLT (ELECTROSURGICAL) ×1 IMPLANT
GAUZE 4X4 16PLY ~~LOC~~+RFID DBL (SPONGE) ×2 IMPLANT
GAUZE SPONGE 4X4 12PLY STRL (GAUZE/BANDAGES/DRESSINGS) IMPLANT
GLOVE EXAM NITRILE XL STR (GLOVE) IMPLANT
GLOVE SRG 8 PF TXTR STRL LF DI (GLOVE) IMPLANT
GLOVE SURG LTX SZ6.5 (GLOVE) ×4 IMPLANT
GLOVE SURG POLYISO LF SZ6.5 (GLOVE) ×1 IMPLANT
GLOVE SURG UNDER POLY LF SZ6.5 (GLOVE) ×1 IMPLANT
GLOVE SURG UNDER POLY LF SZ7 (GLOVE) ×1 IMPLANT
GLOVE SURG UNDER POLY LF SZ8 (GLOVE) ×2
GOWN STRL REUS W/ TWL LRG LVL3 (GOWN DISPOSABLE) ×2 IMPLANT
GOWN STRL REUS W/ TWL XL LVL3 (GOWN DISPOSABLE) IMPLANT
GOWN STRL REUS W/TWL 2XL LVL3 (GOWN DISPOSABLE) IMPLANT
GOWN STRL REUS W/TWL LRG LVL3 (GOWN DISPOSABLE) ×4
GOWN STRL REUS W/TWL XL LVL3 (GOWN DISPOSABLE)
KIT BASIN OR (CUSTOM PROCEDURE TRAY) ×2 IMPLANT
KIT POSITION SURG JACKSON T1 (MISCELLANEOUS) ×2 IMPLANT
KIT TURNOVER KIT B (KITS) ×2 IMPLANT
MILL MEDIUM DISP (BLADE) ×1 IMPLANT
NDL HYPO 25X1 1.5 SAFETY (NEEDLE) ×1 IMPLANT
NDL SPNL 18GX3.5 QUINCKE PK (NEEDLE) IMPLANT
NEEDLE HYPO 25X1 1.5 SAFETY (NEEDLE) ×2 IMPLANT
NEEDLE SPNL 18GX3.5 QUINCKE PK (NEEDLE) IMPLANT
NS IRRIG 1000ML POUR BTL (IV SOLUTION) ×3 IMPLANT
OIL CARTRIDGE MAESTRO DRILL (MISCELLANEOUS) ×2
PACK LAMINECTOMY NEURO (CUSTOM PROCEDURE TRAY) ×2 IMPLANT
PAD ARMBOARD 7.5X6 YLW CONV (MISCELLANEOUS) ×6 IMPLANT
ROD RELINE O COCR 5.0X50MM (Rod) ×2 IMPLANT
SCREW LOCK RSS 4.5/5.0MM (Screw) ×6 IMPLANT
SCREW SHANK RELINE MOD 5.5X35 (Screw) ×3 IMPLANT
SHANK RELINE MOD 5.5X40 (Screw) ×3 IMPLANT
SPONGE SURGIFOAM ABS GEL 100 (HEMOSTASIS) ×2 IMPLANT
SPONGE T-LAP 4X18 ~~LOC~~+RFID (SPONGE) ×2 IMPLANT
STRIP CLOSURE SKIN 1/2X4 (GAUZE/BANDAGES/DRESSINGS) IMPLANT
SUT PROLENE 6 0 BV (SUTURE) IMPLANT
SUT VIC AB 0 CT1 18XCR BRD8 (SUTURE) ×1 IMPLANT
SUT VIC AB 0 CT1 8-18 (SUTURE) ×6
SUT VIC AB 2-0 CT1 18 (SUTURE) ×2 IMPLANT
SUT VIC AB 3-0 SH 8-18 (SUTURE) ×3 IMPLANT
TOWEL GREEN STERILE (TOWEL DISPOSABLE) ×2 IMPLANT
TOWEL GREEN STERILE FF (TOWEL DISPOSABLE) ×2 IMPLANT
WATER STERILE IRR 1000ML POUR (IV SOLUTION) ×2 IMPLANT

## 2021-06-28 NOTE — Anesthesia Procedure Notes (Signed)
Arterial Line Insertion Start/End10/28/2022 1:00 PM, 06/28/2021 1:05 PM Performed by: Albertha Ghee, MD, Kerry Fort, CRNA, CRNA  Patient location: Pre-op. Preanesthetic checklist: patient identified, IV checked, site marked, risks and benefits discussed, surgical consent, monitors and equipment checked, pre-op evaluation, timeout performed and anesthesia consent Lidocaine 1% used for infiltration Left, radial was placed Catheter size: 20 G Hand hygiene performed  and maximum sterile barriers used  Allen's test indicative of satisfactory collateral circulation Attempts: 1 Procedure performed without using ultrasound guided technique. Ultrasound Notes:anatomy identified Following insertion, dressing applied. Post procedure assessment: normal  Patient tolerated the procedure well with no immediate complications.

## 2021-06-28 NOTE — Transfer of Care (Signed)
Immediate Anesthesia Transfer of Care Note  Patient: Shannon [name]  Procedure(s) Performed: Lumbar four-five, Lumbar five-Sacral one Posterior lumbar interbody fusion (Back)  Patient Location: PACU  Anesthesia Type:General  Level of Consciousness: sedated  Airway & Oxygen Therapy: Patient connected to nasal cannula oxygen  Post-op Assessment: Report given to RN and Post -op Vital signs reviewed and stable  Post vital signs: Reviewed and stable  Last Vitals:  Vitals Value Taken Time  BP 154/77 06/28/21 2120  Temp    Pulse 76 06/28/21 2123  Resp 14 06/28/21 2123  SpO2 100 % 06/28/21 2123  Vitals shown include unvalidated device data.  Last Pain:  Vitals:   06/28/21 0847  TempSrc:   PainSc: 8       Patients Stated Pain Goal: 2 (35/45/62 [id number])  Complications: No notable events documented.

## 2021-06-28 NOTE — H&P (Signed)
BP (!) 161/93   Pulse 80   Temp 97.7 F (36.5 C) (Oral)   Resp 18   Ht 5\' 6"  (1.676 m)   Wt 111.1 kg   SpO2 97%   BMI 39.54 kg/m   Shannon [name] comes in today for evaluation of pain that she has in the lower back and lower extremities.  This pain has gotten worse over the last few years.  I had seen her last in 2015 and at that time she had lumbar stenosis, I believe it was L4-5 and she had a listhesis there also.  She comes in today, 50, right-handed.  She is a former smoker. No history of substance abuse.  She does drink alcohol socially.  She is currently not working.  She is on disability.     VITAL SIGNS :  She is 5 feet 6 inches, weighs 249 pounds.  Temperature is 97.5, blood pressure 143/85, pulse 87.  Pain is 8/10.     PAST MEDICAL HISTORY :  Includes hypertension and diabetes.     PAST SURGICAL HISTORY :  She has undergone an appendectomy and a hysterectomy.     MEDICATIONS :  She takes Ozempic, Hydrochlorothiazide, Potassium Chloride, Meclizine, Amlodipine, Cholecalciferol, Duloxetine, Gabapentin, and  Ropinirole.     FAMILY HISTORY :  Mother 22, has hypertension.  Father 34, has hypertension and diabetes.     REVIEW OF SYSTEMS :  She says her pain and symptoms started in 2012.  Review of systems is positive for shortness of breath, leg pain with walking, arthritis, neck pain, back pain, arm pain, and leg pain at rest.  She has numbness and tingling in her feet and her big toes.  She does have a history of headaches.     PHYSICAL EXAMINATION :  She is alert and oriented by 4.  She answers all questions appropriately.  Memory, language, attention span, and fund of knowledge are normal.  She has 5/5 strength in the upper and lower extremities.  She does favor the right lower extremity when she walks.  Her gait is antalgic.  She is able to toe walk and heel walk.  Reflexes 2 to 3+ at the right knee, 2+ left knee, 2+ at the ankles, and 2+ biceps, triceps,  brachioradialis.  Proprioception is intact in both the upper and lower extremities.  Romberg is negative.  She can toe walk, heel walk, and do a deep knee bend.  Pupils equal, round, and reactive to light.  Full extraocular movements.  Full visual fields.  Hearing intact to voice.  She has symmetric facial movements and sensation.  Shannon [name] returns with an MRI of the lumbar spine.  What it shows is that she has a listhesis at 4-5, and comparing is to the film from 2015, she has progressed with regard to stenosis, lateral recess stenosis, and foraminal narrowing, and at 5-1 she has loss of disc height, far more degeneration, little if any space in the lateral recess and neural foramina, and a small amount of listhesis here.  So, she has progressed at those 2 levels. 3-4 shows some moderate stenosis, but not nearly as bad as 4-5 and 5-1.  I'm not going to embark on a 3 level fusion at this time.  I think 4-5 and 5-1 will be sufficient.  We discussed at great length, well over 20 minutes, the scan and expectations.     PLAN :  She would like to proceed with a PLIF and pedicle screws.  We will get this set up.  Risks and benefits were gone over by me in the office, and she also received a very detailed sheet dictated by me going over the operation.

## 2021-06-28 NOTE — Op Note (Signed)
06/28/2021  9:50 PM  PATIENT:  Shannon [name]  65 y.o. 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  POST-OPERATIVE DIAGNOSIS:  Spondylolisthesis, Lumbar region  PROCEDURE:  Procedure(s): Lumbar four-five, Lumbar five-Sacral one Posterior lumbar interbody fusion, Cascadia cages, titanium, filled with autograft morsels 2 15mmx22mm cages st L4/5, 2 37mmx22mm cages at L5/S1 Laminectomy L4, L5 in excess of needed exposure for a PLIF to decompress the neural foramina, and lateral recesses Segmental pedicle screw fixation L4-S1(nuvasive Relign)  SURGEON:  Surgeon(s): Ashok Pall, MD  ASSISTANTS:none  ANESTHESIA:   general  EBL:  Total I/O In: 1500 [I.V.:1500] Out: 355 [Urine:105; Blood:250]  BLOOD ADMINISTERED:none  CELL SAVER GIVEN:none  COUNT:per nursing  DRAINS: none   SPECIMEN:  No Specimen  DICTATION: Shannon [name] is a 65 y.o. female whom was taken to the operating room intubated, and placed under a general anesthetic without difficulty. A foley catheter was placed under sterile conditions. She was positioned prone on a Jackson table with all pressure points properly padded.  Her lumbar region was prepped and draped in a sterile manner. I infiltrated 10cc's 1/2%lidocaine/1:2000,000 strength epinephrine into the planned incision. I opened the skin with a 10 blade and took the incision down to the thoracolumbar fascia. I exposed the lamina of L3,4,5,and the sacrum in a subperiosteal fashion bilaterally. I confirmed my location with an intraoperative xray.  I placed self retaining retractors and started the decompression.  I decompressed the spinal canal via total laminectomies of L4 and L5. I decompressed the lateral recesses and foramina via inferior facetectomies of L4, and L5. Partial superior facetectomies of L5, and S1. I used the drill and Kerrison punches to remove the bone  and ligament.I removed far more bone than needed to complete a PLIF at both levels.  PLIF's were performed at L4/5, and L5/S1 in the same fashion. I opened the disc spaces with a 15 blade then used a variety of instruments to remove the disc and prepare the spaces for the arthrodesis. I used curettes, rongeurs, punches, shavers for the disc space, and rasps in the discetomy. I measured the disc space and placed 65mm cages at L4/5, and 71mm cages at L5/s1.  Titanium cages(Cascadia, stryker) into the disc space(s). The cages were packed with local autograft morsels and the disc spaces  I placed pedicle screws at L4,5,and S1, using fluoroscopic guidance. I drilled a pilot hole, then cannulated the pedicle with a drill at each site. I then tapped each pedicle, assessing each site for pedicle violations. No cutouts were appreciated. Screws (Nuvasive Relign) were then placed at each site without difficulty. I attached rods and locking caps with the appropriate tools. The locking caps were secured with torque limited screwdrivers. Final films were performed and the final construct appeared to be in good position.  I closed the wound in a layered fashion. I approximated the thoracolumbar fascia, subcutaneous, and subcuticular planes with vicryl sutures. I used dermabond and an occlusive bandage for a sterile dressing.     PLAN OF CARE: Admit to inpatient   PATIENT DISPOSITION:  PACU - hemodynamically stable.   Delay start of Pharmacological VTE agent (>24hrs) due to surgical blood loss or risk of bleeding:  yes

## 2021-06-28 NOTE — Anesthesia Procedure Notes (Signed)
Procedure Name: Intubation Date/Time: 06/28/2021 1:43 PM Performed by: Kerry Fort, CRNA Pre-anesthesia Checklist: Patient identified, Emergency Drugs available, Suction available and Patient being monitored Patient Re-evaluated:Patient Re-evaluated prior to induction Oxygen Delivery Method: Circle system utilized Preoxygenation: Pre-oxygenation with 100% oxygen Induction Type: IV induction Ventilation: Mask ventilation without difficulty and Oral airway inserted - appropriate to patient size Laryngoscope Size: Mac and 3 Grade View: Grade I Tube type: Oral Tube size: 7.0 mm Number of attempts: 1 Airway Equipment and Method: Stylet and Oral airway Placement Confirmation: ETT inserted through vocal cords under direct vision, positive ETCO2 and breath sounds checked- equal and bilateral Secured at: 21 cm Tube secured with: Tape Dental Injury: Teeth and Oropharynx as per pre-operative assessment

## 2021-06-28 NOTE — Anesthesia Postprocedure Evaluation (Signed)
Anesthesia Post Note  Patient: Shannon [name]  Procedure(s) Performed: Lumbar four-five, Lumbar five-Sacral one Posterior lumbar interbody fusion (Back)     Patient location during evaluation: PACU Anesthesia Type: General Level of consciousness: awake and alert Pain management: pain level controlled Vital Signs Assessment: post-procedure vital signs reviewed and stable Respiratory status: spontaneous breathing, nonlabored ventilation, respiratory function stable and patient connected to nasal cannula oxygen Cardiovascular status: blood pressure returned to baseline and stable Postop Assessment: no apparent nausea or vomiting Anesthetic complications: no   No notable events documented.  Last Vitals:  Vitals:   06/28/21 2145 06/28/21 2200  BP: (!) 158/86 125/65  Pulse: 77 79  Resp: 15 12  Temp:    SpO2: 100% 99%    Last Pain:  Vitals:   06/28/21 2200  TempSrc:   PainSc: Asleep    LLE Motor Response: Purposeful movement;Responds to commands (06/28/21 2200) LLE Sensation: Full sensation (06/28/21 2200) RLE Motor Response: Purposeful movement;Responds to commands (06/28/21 2200) RLE Sensation: Full sensation (06/28/21 2200)      Catalina Gravel

## 2021-06-28 NOTE — Progress Notes (Signed)
Left radial arterial line removed per order.  Pressure held and dressing applied, site unremarkable.  Will continue to monitor.

## 2021-06-28 NOTE — Anesthesia Procedure Notes (Signed)
Central Venous Catheter Insertion Performed by: Albertha Ghee, MD, anesthesiologist Start/End10/28/2022 1:55 PM, 06/28/2021 2:04 PM Patient location: Pre-op. Preanesthetic checklist: patient identified, IV checked, site marked, risks and benefits discussed, surgical consent, monitors and equipment checked, pre-op evaluation, timeout performed and anesthesia consent Position: Trendelenburg Lidocaine 1% used for infiltration and patient sedated Hand hygiene performed , maximum sterile barriers used  and Seldinger technique used Catheter size: 7 Fr Central line was placed.Double lumen Procedure performed using ultrasound guided technique. Ultrasound Notes:anatomy identified, needle tip was noted to be adjacent to the nerve/plexus identified, no ultrasound evidence of intravascular and/or intraneural injection and image(s) printed for medical record Attempts: 1 Following insertion, line sutured, dressing applied and Biopatch. Post procedure assessment: blood return through all ports, free fluid flow and no air  Patient tolerated the procedure well with no immediate complications.

## 2021-06-29 LAB — CBC
HCT: 39.9 % (ref 36.0–46.0)
Hemoglobin: 12.8 g/dL (ref 12.0–15.0)
MCH: 28.3 pg (ref 26.0–34.0)
MCHC: 32.1 g/dL (ref 30.0–36.0)
MCV: 88.3 fL (ref 80.0–100.0)
Platelets: 254 10*3/uL (ref 150–400)
RBC: 4.52 MIL/uL (ref 3.87–5.11)
RDW: 13.5 % (ref 11.5–15.5)
WBC: 14.8 10*3/uL — ABNORMAL HIGH (ref 4.0–10.5)
nRBC: 0 % (ref 0.0–0.2)

## 2021-06-29 LAB — CREATININE, SERUM
Creatinine, Ser: 0.84 mg/dL (ref 0.44–1.00)
GFR, Estimated: >60 mL/min (ref >60)

## 2021-06-29 MED ORDER — LIP MEDEX EX OINT
TOPICAL_OINTMENT | CUTANEOUS | Status: DC | PRN
  Filled 2021-06-29: qty 7

## 2021-06-29 NOTE — Progress Notes (Signed)
NEUROSURGERY PROGRESS NOTE  Doing well. Complains of appropriate back soreness. Some leg pain No numbness, tingling or weakness Good strength and sensation Incision CDI  Temp:  [97.7 F (36.5 C)-98.1 F (36.7 C)] 98 F (36.7 C) (10/29 0338) Pulse Rate:  [75-84] 84 (10/29 0338) Resp:  [11-18] 16 (10/29 0338) BP: (118-158)/(63-107) 118/70 (10/29 0338) SpO2:  [99 %-100 %] 100 % (10/29 0338)  Plan: Postop day 1 lumbar fusion. Therapy today and see how she does ambulating.   Eleonore Chiquito, NP 06/29/2021 8:24 AM

## 2021-06-29 NOTE — Evaluation (Signed)
Physical Therapy Evaluation Patient Details Name: Shannon [name] MRN: [id number] DOB: 09/08/1955 Today's Date: 06/29/2021  History of Present Illness  Patient is a 65 year old female S/P L4/L5 L5/S1 sacral fusion on 06/29/2021. At baseline she had L LE numbness and wekaness. The numbness has resolved since surgery. PMH: arthritis , constipation, derfpession, DMII, fibromyalgia, Knee pain, resltess leg, LE edema, scoliosis, Vitamin D deficiancy,  Clinical Impression  Patient presents with expected pain and limitations in motion. Her gait was limited by nausea but her stability was good. With a good progression of gait distance she should be able to progress to home. Therapy will continue to follow. She required min a for log roll. She required min cuing for proper use of the walker.      Recommendations for follow up therapy are one component of a multi-disciplinary discharge planning process, led by the attending physician.  Recommendations may be updated based on patient status, additional functional criteria and insurance authorization.  Follow Up Recommendations Home health PT    Assistance Recommended at Discharge Intermittent Supervision/Assistance  Functional Status Assessment Patient has had a recent decline in their functional status and demonstrates the ability to make significant improvements in function in a reasonable and predictable amount of time.  Equipment Recommendations  Rolling walker (2 wheels);BSC    Recommendations for Other Services       Precautions / Restrictions Precautions Precautions: Back;Fall Precaution Booklet Issued: Yes (comment) Precaution Comments: reviewed BLT percautions Required Braces or Orthoses:  (none per chart) Restrictions Weight Bearing Restrictions: No      Mobility  Bed Mobility Overal bed mobility: Needs Assistance Bed Mobility: Supine to Sit     Supine to sit: Min assist     General bed mobility comments: Min a and mod cuing  for proper log roll    Transfers Overall transfer level: Needs assistance Equipment used: Rolling walker (2 wheels) Transfers: Sit to/from Stand Sit to Stand: Min guard           General transfer comment: min gaurd and mod cuing for proper use of RW    Ambulation/Gait Ambulation/Gait assistance: Min guard Gait Distance (Feet): 15 Feet Assistive device: Rolling walker (2 wheels) Gait Pattern/deviations: Step-to pattern Gait velocity: decreased Gait velocity interpretation: <1.31 ft/sec, indicative of household ambulator General Gait Details: Patients gait was limited 2nd to during ambualtion she became neauseated and vomited.  Stairs            Wheelchair Mobility    Modified Rankin (Stroke Patients Only)       Balance Overall balance assessment: Needs assistance Sitting-balance support: Feet supported;Feet unsupported Sitting balance-Leahy Scale: Good     Standing balance support: Bilateral upper extremity supported Standing balance-Leahy Scale: Poor                               Pertinent Vitals/Pain Pain Assessment: Faces Faces Pain Scale: Hurts even more Pain Location: lumbar spine and tailbone Pain Descriptors / Indicators: Aching Pain Intervention(s): Limited activity within patient's tolerance;Monitored during session;Repositioned    Home Living Family/patient expects to be discharged to:: Private residence Living Arrangements: Spouse/significant other   Type of Home: House Home Access: Level entry (per old chart she did have steps)       Home Layout: One level Home Equipment:  (reported none today but old chart says she has a walker)      Prior Function Prior Level of Function :  Independent/Modified Independent             Mobility Comments: Patient reports she did not use a device for ambualtion but had right LE pain that limited her mobility       Hand Dominance   Dominant Hand: Right    Extremity/Trunk  Assessment   Upper Extremity Assessment Upper Extremity Assessment: Defer to OT evaluation    Lower Extremity Assessment Lower Extremity Assessment: RLE deficits/detail RLE Deficits / Details: mild defcits in hip felxion and abduction RLE Sensation: WNL    Cervical / Trunk Assessment Cervical / Trunk Assessment: Normal  Communication   Communication: No difficulties  Cognition Arousal/Alertness: Awake/alert Behavior During Therapy: WFL for tasks assessed/performed Overall Cognitive Status: Within Functional Limits for tasks assessed                                          General Comments General comments (skin integrity, edema, etc.): bandage on patients back    Exercises     Assessment/Plan    PT Assessment Patient needs continued PT services  PT Problem List Decreased strength;Decreased range of motion;Decreased activity tolerance;Decreased mobility;Decreased knowledge of use of DME;Decreased safety awareness;Pain       PT Treatment Interventions DME instruction;Gait training;Functional mobility training;Stair training;Therapeutic activities;Therapeutic exercise;Patient/family education;Neuromuscular re-education    PT Goals (Current goals can be found in the Care Plan section)  Acute Rehab PT Goals Patient Stated Goal: to have less pain in her back PT Goal Formulation: With patient Time For Goal Achievement: 07/06/21 Potential to Achieve Goals: Good    Frequency Min 5X/week   Barriers to discharge        Co-evaluation               AM-PAC PT "6 Clicks" Mobility  Outcome Measure Help needed turning from your back to your side while in a flat bed without using bedrails?: A Little Help needed moving from lying on your back to sitting on the side of a flat bed without using bedrails?: A Little Help needed moving to and from a bed to a chair (including a wheelchair)?: A Little Help needed standing up from a chair using your arms (e.g.,  wheelchair or bedside chair)?: A Little Help needed to walk in hospital room?: A Little Help needed climbing 3-5 steps with a railing? : A Lot 6 Click Score: 17    End of Session Equipment Utilized During Treatment: Gait belt Activity Tolerance: Patient tolerated treatment well Patient left: in bed Nurse Communication: Mobility status PT Visit Diagnosis: Unsteadiness on feet (R26.81);Other abnormalities of gait and mobility (R26.89);Pain;Difficulty in walking, not elsewhere classified (R26.2) Pain - part of body:  (back)    Time: 0930-1003 PT Time Calculation (min) (ACUTE ONLY): 33 min   Charges:   PT Evaluation $PT Eval Moderate Complexity: 1 Mod           Carney Living PT DPT  06/29/2021, 10:51 AM

## 2021-06-29 NOTE — Evaluation (Signed)
Occupational Therapy Evaluation Patient Details Name: Shannon [name] MRN: [id number] DOB: Sep 02, 1955 Today's Date: 06/29/2021   History of Present Illness Patient is a 65 year old female S/P L4/L5 L5/S1 sacral fusion on 06/29/2021. At baseline she had L LE numbness and wekaness. The numbness has resolved since surgery. PMH: arthritis , constipation, derfpession, DMII, fibromyalgia, Knee pain, resltess leg, LE edema, scoliosis, Vitamin D deficiancy,   Clinical Impression   Pt admitted for procedure listed above. PTA pt reported that she was independent with all ADL's and IADL's, however she was limited by pain, requiring frequent breaks. At this time pt presents with increased pain and weakness, requiring min guard to min A for all transfers and using a RW for stability. Additionally, due to poor flexibility and spinal precautions, pt requiring mod A for all LB ADL's. Recommending HHOT to maximize pts independence and safety in the home. OT will follow acutely to address problems listed below.      Recommendations for follow up therapy are one component of a multi-disciplinary discharge planning process, led by the attending physician.  Recommendations may be updated based on patient status, additional functional criteria and insurance authorization.   Follow Up Recommendations  Home health OT    Assistance Recommended at Discharge Set up Supervision/Assistance  Functional Status Assessment  Patient has had a recent decline in their functional status and demonstrates the ability to make significant improvements in function in a reasonable and predictable amount of time.  Equipment Recommendations  BSC;Tub/shower bench;Other (comment) (Rollator)    Recommendations for Other Services       Precautions / Restrictions Precautions Precautions: Back;Fall Precaution Booklet Issued: Yes (comment) Precaution Comments: reviewed BLT percautions Required Braces or Orthoses:  (none per  chart) Restrictions Weight Bearing Restrictions: No      Mobility Bed Mobility Overal bed mobility: Needs Assistance Bed Mobility: Supine to Sit     Supine to sit: Min assist     General bed mobility comments: Min a and mod cuing for proper log roll    Transfers Overall transfer level: Needs assistance Equipment used: Rolling walker (2 wheels) Transfers: Sit to/from Stand Sit to Stand: Min guard           General transfer comment: min gaurd and mod cuing for proper use of RW      Balance Overall balance assessment: Needs assistance Sitting-balance support: Feet supported;Feet unsupported Sitting balance-Leahy Scale: Good     Standing balance support: Bilateral upper extremity supported Standing balance-Leahy Scale: Poor                             ADL either performed or assessed with clinical judgement   ADL Overall ADL's : Needs assistance/impaired Eating/Feeding: Independent;Sitting   Grooming: Min guard;Standing   Upper Body Bathing: Supervision/ safety;Sitting   Lower Body Bathing: Minimal assistance;Sitting/lateral leans;Sit to/from stand   Upper Body Dressing : Supervision/safety;Sitting   Lower Body Dressing: Moderate assistance;Sitting/lateral leans;Sit to/from stand   Toilet Transfer: Minimal assistance;Ambulation   Toileting- Clothing Manipulation and Hygiene: Minimal assistance;Sitting/lateral lean;Sit to/from stand       Functional mobility during ADLs: Min guard;Rolling walker (2 wheels) General ADL Comments: Pt requiring increased assist for LB ADL's due to poor flexibiity and spinal precautions     Vision Baseline Vision/History: 1 Wears glasses Ability to See in Adequate Light: 0 Adequate Patient Visual Report: No change from baseline Vision Assessment?: No apparent visual deficits  Perception     Praxis      Pertinent Vitals/Pain Pain Assessment: Faces Faces Pain Scale: Hurts even more Pain Location: lumbar  spine and tailbone Pain Descriptors / Indicators: Aching Pain Intervention(s): Limited activity within patient's tolerance;Monitored during session;Repositioned     Hand Dominance Right   Extremity/Trunk Assessment Upper Extremity Assessment Upper Extremity Assessment: Generalized weakness   Lower Extremity Assessment Lower Extremity Assessment: Defer to PT evaluation   Cervical / Trunk Assessment Cervical / Trunk Assessment: Back Surgery   Communication Communication Communication: No difficulties   Cognition Arousal/Alertness: Awake/alert Behavior During Therapy: WFL for tasks assessed/performed Overall Cognitive Status: Within Functional Limits for tasks assessed                                       General Comments  Honeycomb bandage bloody, RN notifieed    Exercises     Shoulder Instructions      Home Living Family/patient expects to be discharged to:: Private residence Living Arrangements: Spouse/significant other Available Help at Discharge: Family Type of Home: House Home Access: Level entry (per old chart she did have steps)     Home Layout: One level     Bathroom Shower/Tub: Teacher, early years/pre: Standard Bathroom Accessibility: Yes   Home Equipment: None          Prior Functioning/Environment Prior Level of Function : Independent/Modified Independent             Mobility Comments: Patient reports she did not use a device for ambualtion but had right LE pain that limited her mobility ADLs Comments: Independent        OT Problem List: Decreased strength;Decreased range of motion;Decreased activity tolerance;Impaired balance (sitting and/or standing);Decreased safety awareness;Decreased knowledge of use of DME or AE;Pain      OT Treatment/Interventions: Self-care/ADL training;Therapeutic exercise;Energy conservation;DME and/or AE instruction;Therapeutic activities;Patient/family education;Balance training     OT Goals(Current goals can be found in the care plan section) Acute Rehab OT Goals Patient Stated Goal: To be independent again OT Goal Formulation: With patient Time For Goal Achievement: 07/13/21 Potential to Achieve Goals: Good ADL Goals Pt Will Perform Lower Body Bathing: with modified independence;with adaptive equipment;sitting/lateral leans;sit to/from stand Pt Will Perform Lower Body Dressing: with modified independence;with adaptive equipment;sitting/lateral leans;sit to/from stand Pt Will Transfer to Toilet: with modified independence;ambulating Pt Will Perform Toileting - Clothing Manipulation and hygiene: with modified independence;with adaptive equipment;sitting/lateral leans;sit to/from stand Additional ADL Goal #1: Pt will follow 3 out of 3 back precautions 100% of the time, independently.  OT Frequency: Min 2X/week   Barriers to D/C:            Co-evaluation              AM-PAC OT "6 Clicks" Daily Activity     Outcome Measure Help from another person eating meals?: None Help from another person taking care of personal grooming?: A Little Help from another person toileting, which includes using toliet, bedpan, or urinal?: A Little Help from another person bathing (including washing, rinsing, drying)?: A Little Help from another person to put on and taking off regular upper body clothing?: A Little Help from another person to put on and taking off regular lower body clothing?: A Lot 6 Click Score: 18   End of Session Equipment Utilized During Treatment: Rolling walker (2 wheels) Nurse Communication: Mobility status  Activity Tolerance: Patient  limited by pain;Patient tolerated treatment well Patient left: in chair;with call bell/phone within reach  OT Visit Diagnosis: Unsteadiness on feet (R26.81);Other abnormalities of gait and mobility (R26.89);Muscle weakness (generalized) (M62.81)                Time: 7096-4383 OT Time Calculation (min): 26  min Charges:  OT General Charges $OT Visit: 1 Visit OT Evaluation $OT Eval Moderate Complexity: 1 Mod OT Treatments $Self Care/Home Management : 8-22 mins  Gordana Kewley H., OTR/L Acute Rehabilitation  Ople Girgis Elane Aislinn Feliz 06/29/2021, 2:35 PM

## 2021-06-30 NOTE — Plan of Care (Signed)
  Problem: Activity: Goal: Risk for activity intolerance will decrease Outcome: Progressing   Problem: Nutrition: Goal: Adequate nutrition will be maintained Outcome: Progressing   Problem: Pain Managment: Goal: General experience of comfort will improve Outcome: Progressing   Problem: Safety: Goal: Ability to remain free from injury will improve Outcome: Progressing   Problem: Skin Integrity: Goal: Risk for impaired skin integrity will decrease Outcome: Progressing   

## 2021-06-30 NOTE — Progress Notes (Addendum)
Pt up walking with therapy when a moderate amount of drainage noted on pt honeycomb dressing along with drops on the floor as the pt was up participating in her physical therapy session. Honey comb dressing removed, drainage wiped away with 4x4 gauze. 4x4 gauze placed over incision and adhesive tape applied over gauze. Pt encouraged to rest on her back at times instead of her side at all times. Staff will continue to monitor dressing over surgical site.

## 2021-06-30 NOTE — Progress Notes (Signed)
Physical Therapy Treatment Patient Details Name: Shannon [name] MRN: [id number] DOB: [birth year] Today's Date: 06/30/2021   History of Present Illness Patient is a 65 year old female S/P L4/L5 L5/S1 sacral fusion on 06/29/2021. At baseline she had L LE numbness and wekaness. The numbness has resolved since surgery. PMH: arthritis , constipation, derfpession, DMII, fibromyalgia, Knee pain, resltess leg, LE edema, scoliosis, Vitamin D deficiancy,    PT Comments    Patient progressing towards physical therapy goals. Patient ambulated 18' with RW and supervision. Patient with drainage from incision, RN notified and present to change dressing. Patient motivated to get better and participate with therapy. Able to recall 3/3 precautions with minimal cueing. D/c plan remains appropriate at this time.     Recommendations for follow up therapy are one component of a multi-disciplinary discharge planning process, led by the attending physician.  Recommendations may be updated based on patient status, additional functional criteria and insurance authorization.  Follow Up Recommendations  Home health PT     Assistance Recommended at Discharge Intermittent Supervision/Assistance  Equipment Recommendations  Rolling Brysin Towery (2 wheels);BSC    Recommendations for Other Services       Precautions / Restrictions Precautions Precautions: Back;Fall Precaution Booklet Issued: Yes (comment) Precaution Comments: reviewed BLT percautions Required Braces or Orthoses:  (no brace needed) Restrictions Weight Bearing Restrictions: No     Mobility  Bed Mobility Overal bed mobility: Needs Assistance Bed Mobility: Supine to Sit     Supine to sit: Min assist     General bed mobility comments: minA for trunk elevation    Transfers Overall transfer level: Needs assistance Equipment used: Rolling Chrishelle Zito (2 wheels) Transfers: Sit to/from Stand Sit to Stand: Min assist           General transfer  comment: minA to power up from low surface. Cues for hand placement    Ambulation/Gait Ambulation/Gait assistance: Supervision Gait Distance (Feet): 70 Feet Assistive device: Rolling Rosela Supak (2 wheels) Gait Pattern/deviations: Step-through pattern;Decreased stride length Gait velocity: decreased   General Gait Details: slow steady gait. Supervision for safety   Stairs             Wheelchair Mobility    Modified Rankin (Stroke Patients Only)       Balance Overall balance assessment: Needs assistance Sitting-balance support: No upper extremity supported;Feet supported Sitting balance-Leahy Scale: Good     Standing balance support: Bilateral upper extremity supported;Reliant on assistive device for balance Standing balance-Leahy Scale: Poor                              Cognition Arousal/Alertness: Awake/alert Behavior During Therapy: WFL for tasks assessed/performed Overall Cognitive Status: Within Functional Limits for tasks assessed                                          Exercises      General Comments        Pertinent Vitals/Pain Pain Assessment: Faces Faces Pain Scale: Hurts little more Pain Location: lumbar spine and tailbone Pain Descriptors / Indicators: Aching Pain Intervention(s): Monitored during session;Repositioned    Home Living                          Prior Function            PT  Goals (current goals can now be found in the care plan section) Acute Rehab PT Goals Patient Stated Goal: to have less pain in her back PT Goal Formulation: With patient Time For Goal Achievement: 07/06/21 Potential to Achieve Goals: Good Progress towards PT goals: Progressing toward goals    Frequency    Min 5X/week      PT Plan Current plan remains appropriate    Co-evaluation              AM-PAC PT "6 Clicks" Mobility   Outcome Measure  Help needed turning from your back to your side while in  a flat bed without using bedrails?: A Little Help needed moving from lying on your back to sitting on the side of a flat bed without using bedrails?: A Little Help needed moving to and from a bed to a chair (including a wheelchair)?: A Little Help needed standing up from a chair using your arms (e.g., wheelchair or bedside chair)?: A Little Help needed to walk in hospital room?: A Little Help needed climbing 3-5 steps with a railing? : A Lot 6 Click Score: 17    End of Session Equipment Utilized During Treatment: Gait belt Activity Tolerance: Patient tolerated treatment well Patient left: in chair;with call bell/phone within reach Nurse Communication: Mobility status PT Visit Diagnosis: Unsteadiness on feet (R26.81);Other abnormalities of gait and mobility (R26.89);Pain;Difficulty in walking, not elsewhere classified (R26.2)     Time: 5038-8828 PT Time Calculation (min) (ACUTE ONLY): 30 min  Charges:  $Gait Training: 23-37 mins                     Shakima Nisley A. Gilford Rile PT, DPT Acute Rehabilitation Services Pager [phone] Office [phone]    Linna Hoff 06/30/2021, 5:26 PM

## 2021-06-30 NOTE — Progress Notes (Signed)
Patient complains of significant back soreness and some aching in the legs.  She is getting up and walking around the room.  She is using her incentive spirometer.  She has good dorsi and plantar flexion.  Dressing is dry.  Continue to mobilize.  Continue pain control.  Still requiring IV pain medications.

## 2021-07-01 LAB — GLUCOSE, CAPILLARY: Glucose-Capillary: 143 mg/dL — ABNORMAL HIGH (ref 70–99)

## 2021-07-01 NOTE — TOC Initial Note (Signed)
Transition of Care [facility]) - Initial/Assessment Note    Patient Details  Name: Shannon [name] MRN: [id number] Date of Birth: 07/03/1956  [facility] CM/SW Contact:    Pollie Friar, RN Phone Number: 07/01/2021, 11:55 AM  Clinical Narrative:                 Patient is s/p back surgery. She lives at home with spouse. Pt states that between her spouse, neighbors and other family she has support at home.  Walker and 3 in 1 ordered for home from [place]. DME will be delivered to the room.  Pt denies issues with home medications or transportation.  Home health arranged with [place]. Pt has no preference after provided choice under [email or web address] Pt has transportation home when medically ready.   Expected Discharge Plan: [place] Barriers to Discharge: Continued Medical Work up   Patient Goals and CMS Choice   CMS Medicare.gov Compare Post Acute Care list provided to:: Patient Choice offered to / list presented to : Patient  Expected Discharge Plan and Services Expected Discharge Plan: [place]   Discharge Planning Services: CM Consult Post Acute Care Choice: [place] arrangements for the past 2 months: Single Family Home                 DME Arranged: 3-N-1, Walker rolling DME Agency: [employer] Date DME Agency Contacted: 07/01/21   Representative spoke with at DME Agency: Jamal Maes HH Arranged: PT, OT [place] Agency: [place] Date [place]: 07/01/21   Representative spoke with at [place]  Prior Living Arrangements/Services Living arrangements for the past 2 months: [place] with:: Spouse Patient language and need for interpreter reviewed:: Yes Do you feel safe going back to the place where you live?: Yes      Need for Family Participation in Patient Care: Yes (Comment) Care giver support system in place?: Yes (comment)   Criminal Activity/Legal Involvement  Pertinent to Current Situation/Hospitalization: No - Comment as needed  Activities of Daily Living      Permission Sought/Granted                  Emotional Assessment Appearance:: Appears stated age Attitude/Demeanor/Rapport: Engaged Affect (typically observed): Accepting Orientation: : Oriented to Self, Oriented to Place, Oriented to  Time, Oriented to Situation   Psych Involvement: No (comment)  Admission diagnosis:  S/P lumbar spinal fusion [Z98.1] Spondylolisthesis of lumbar region [M43.16] Patient Active Problem List   Diagnosis Date Noted   S/P lumbar spinal fusion 06/28/2021   Spondylolisthesis of lumbar region 06/28/2021   Constipation 02/21/2021   Diabetes mellitus (DISH) 11/26/2020   Chronic pain syndrome 11/26/2020   Appendicitis 10/28/2017   Cigarette smoker 07/26/2015   Dyspnea 05/29/2015   BACK PAIN, LUMBAR 05/29/2008   SARCOIDOSIS 12/21/2007   FATIGUE, CHRONIC 12/21/2007   RECTAL BLEEDING 12/14/2007   NECK PAIN, ACUTE 12/14/2007   ABDOMINAL PAIN, GENERALIZED 12/14/2007   DEPRESSION 05/13/2007   Hypertension associated with type 2 diabetes mellitus ([place]) 05/13/2007   PERIODONTAL DISEASE 05/13/2007   HEMATURIA 05/13/2007   ALLERGY 05/13/2007   Class 3 severe obesity with serious comorbidity and body mass index (BMI) of 40.0 to 44.9 in adult [facility]) 05/07/2007   PCP:  Aretta Nip, MD Pharmacy:   [facility] - [place], [place] - [address] [place] [address] Phone: [phone] Fax: [phone]  Social Determinants of Health (SDOH) Interventions    Readmission Risk Interventions No flowsheet data found.   

## 2021-07-01 NOTE — Progress Notes (Signed)
Occupational Therapy Treatment Patient Details Name: Shannon [name] MRN: [id number] DOB: 01/31/1956 Today's Date: 07/01/2021   History of present illness Patient is a 65 year old female S/P L4/L5 L5/S1 sacral fusion on 06/29/2021. At baseline she had L LE numbness and wekaness. The numbness has resolved since surgery. PMH: arthritis , constipation, derfpession, DMII, fibromyalgia, Knee pain, resltess leg, LE edema, scoliosis, Vitamin D deficiancy,   OT comments  [place] is progressing well today with better pain management, eager to return home. Session focused on AE education to increase independence with LB ADLs. Pt demonstrated great understanding of utilizing a reacher, sock aide, long shoe horn and long sponge - verbalized understanding to purchase AE on [place]. Pt has great understanding of her back precautions and insight into safety. She continues to benefit from OT acutely. D/c recommendation remains appropriate.    Recommendations for follow up therapy are one component of a multi-disciplinary discharge planning process, led by the attending physician.  Recommendations may be updated based on patient status, additional functional criteria and insurance authorization.    Follow Up Recommendations  Home health OT    Assistance Recommended at Discharge Set up Supervision/Assistance  Equipment Recommendations  BSC;Tub/shower bench;Other (comment)       Precautions / Restrictions Precautions Precautions: Back;Fall Precaution Booklet Issued: Yes (comment) Precaution Comments: pt with3/3 recall Restrictions Weight Bearing Restrictions: No       Mobility Bed Mobility Overal bed mobility: Needs Assistance             General bed mobility comments: pt in chair upon arrival    Transfers Overall transfer level: Needs assistance Equipment used: Rolling walker (2 wheels) Transfers: Sit to/from Stand Sit to Stand: Min assist                 Balance Overall balance  assessment: Needs assistance Sitting-balance support: Feet supported Sitting balance-Leahy Scale: Good                                     ADL either performed or assessed with clinical judgement   ADL Overall ADL's : Needs assistance/impaired             Lower Body Bathing: Set up;Sit to/from stand Lower Body Bathing Details (indicate cue type and reason): educated on use of long sponge for LB bathing     Lower Body Dressing: Min guard;Sit to/from stand;Cueing for back precautions;Cueing for compensatory techniques;With adaptive equipment Lower Body Dressing Details (indicate cue type and reason): educated on use of reacher, long shoe horn and sock aide - pt demonstrated great understanding     Toileting- Clothing Manipulation and Hygiene: Min guard;Cueing for compensatory techniques;Cueing for back precautions Toileting - Clothing Manipulation Details (indicate cue type and reason): educated pt on use of toilet tongs - verbalized understanding     Functional mobility during ADLs: Min guard;Rolling walker (2 wheels) General ADL Comments: session focused on AE education; pt given AE hand out  and link to [place] for devices. Pt demonstrated graet abiliy to use AE to increase indep with ADLs      Cognition Arousal/Alertness: Awake/alert Behavior During Therapy: WFL for tasks assessed/performed Overall Cognitive Status: Within Functional Limits for tasks assessed                                 General Comments: pt recalled  all back precautions and demonstrated great ability to use AE                General Comments VSS on RA, no new concerns. pt apprecative of AE education    Pertinent Vitals/ Pain       Pain Assessment: No/denies pain Pain Intervention(s): Monitored during session   Frequency  Min 2X/week        Progress Toward Goals  OT Goals(current goals can now be found in the care plan section)  Progress towards OT goals:  Progressing toward goals  Acute Rehab OT Goals Patient Stated Goal: home soon OT Goal Formulation: With patient Time For Goal Achievement: 07/13/21 Potential to Achieve Goals: Good ADL Goals Pt Will Perform Lower Body Bathing: with modified independence;with adaptive equipment;sitting/lateral leans;sit to/from stand Pt Will Perform Lower Body Dressing: with modified independence;with adaptive equipment;sitting/lateral leans;sit to/from stand Pt Will Transfer to Toilet: with modified independence;ambulating Pt Will Perform Toileting - Clothing Manipulation and hygiene: with modified independence;with adaptive equipment;sitting/lateral leans;sit to/from stand Additional ADL Goal #1: Pt will follow 3 out of 3 back precautions 100% of the time, independently.  Plan Discharge plan remains appropriate    Co-evaluation                 AM-PAC OT "6 Clicks" Daily Activity     Outcome Measure   Help from another person eating meals?: None Help from another person taking care of personal grooming?: A Little Help from another person toileting, which includes using toliet, bedpan, or urinal?: A Little Help from another person bathing (including washing, rinsing, drying)?: A Little Help from another person to put on and taking off regular upper body clothing?: A Little Help from another person to put on and taking off regular lower body clothing?: A Lot 6 Click Score: 18    End of Session Equipment Utilized During Treatment: Rolling walker (2 wheels)  OT Visit Diagnosis: Unsteadiness on feet (R26.81);Other abnormalities of gait and mobility (R26.89);Muscle weakness (generalized) (M62.81)   Activity Tolerance Patient tolerated treatment well   Patient Left in chair;with call bell/phone within reach   Nurse Communication Mobility status        Time: 1411-1433 OT Time Calculation (min): 22 min  Charges: OT General Charges $OT Visit: 1 Visit OT Treatments $Self Care/Home  Management : 8-22 mins  Mclane Arora A Issam Carlyon 07/01/2021, 3:35 PM

## 2021-07-01 NOTE — Care Management Important Message (Signed)
Important Message  Patient Details  Name: Shannon [name] MRN: [id number] Date of Birth: 04-10-1956   Medicare Important Message Given:  Yes     Halley Kincer 07/01/2021, 2:05 PM

## 2021-07-01 NOTE — Progress Notes (Signed)
Patient ID: Shannon [name], female   DOB: 12-24-1955, 65 y.o.   MRN: [id number] BP (!) 147/77 (BP Location: Left Arm)   Pulse 95   Temp 98.9 F (37.2 C)   Resp 16   Ht 5\' 6"  (1.676 m)   Wt 111.1 kg   SpO2 100%   BMI 39.54 kg/m  Alert and oriented x 4, speech is clear and fluent Moving all extremities well Will try for discharge on Wednesday Working hard with PT

## 2021-07-01 NOTE — Plan of Care (Signed)
  Problem: Activity: Goal: Risk for activity intolerance will decrease Outcome: Progressing   Problem: Nutrition: Goal: Adequate nutrition will be maintained Outcome: Progressing   Problem: Pain Managment: Goal: General experience of comfort will improve Outcome: Progressing   Problem: Safety: Goal: Ability to remain free from injury will improve Outcome: Progressing   Problem: Skin Integrity: Goal: Risk for impaired skin integrity will decrease Outcome: Progressing   

## 2021-07-01 NOTE — Progress Notes (Signed)
Physical Therapy Treatment Patient Details Name: Shannon [name] MRN: [id number] DOB: 1955-10-28 Today's Date: 07/01/2021   History of Present Illness Patient is a 65 year old female S/P L4/L5 L5/S1 sacral fusion on 06/29/2021. At baseline she had L LE numbness and wekaness. The numbness has resolved since surgery. PMH: arthritis , constipation, derfpession, DMII, fibromyalgia, Knee pain, resltess leg, LE edema, scoliosis, Vitamin D deficiancy,    PT Comments    Pt slowly progressing towards all goals. Pt moves very guarded and at a slow pace. Pt was able to use restroom, provide hygiene, and wash hands while adhering to back precautions. Pt remains to require assist to stand from lower surface height and for bed mobility. Acute PT to cont to follow to progress indep.    Recommendations for follow up therapy are one component of a multi-disciplinary discharge planning process, led by the attending physician.  Recommendations may be updated based on patient status, additional functional criteria and insurance authorization.  Follow Up Recommendations  Home health PT     Assistance Recommended at Discharge Intermittent Supervision/Assistance  Equipment Recommendations  Rolling walker (2 wheels);3in1 (PT)    Recommendations for Other Services       Precautions / Restrictions Precautions Precautions: Back;Fall Precaution Booklet Issued: Yes (comment) Precaution Comments: pt with3/3 recall Restrictions Weight Bearing Restrictions: No     Mobility  Bed Mobility Overal bed mobility: Needs Assistance Bed Mobility: Sit to Sidelying;Rolling Rolling: Min assist       Sit to sidelying: Mod assist General bed mobility comments: max directional verbal cues for log roll technique and to prevent twisting, modA for LE management into bed    Transfers Overall transfer level: Needs assistance Equipment used: Rolling walker (2 wheels) Transfers: Sit to/from Stand Sit to Stand: Min  assist           General transfer comment: minA to power up from chair, pt prefers higher surface height    Ambulation/Gait Ambulation/Gait assistance: Supervision Gait Distance (Feet): 120 Feet Assistive device: Rolling walker (2 wheels) Gait Pattern/deviations: Step-through pattern;Decreased stride length Gait velocity: dec Gait velocity interpretation: <1.31 ft/sec, indicative of household ambulator General Gait Details: very slow steady gait, verbal cues to relax shoulders and depend more on LEs   Stairs             Wheelchair Mobility    Modified Rankin (Stroke Patients Only)       Balance Overall balance assessment: Needs assistance Sitting-balance support: No upper extremity supported;Feet supported Sitting balance-Leahy Scale: Good Sitting balance - Comments: pt able to perform hygiene s/p urinating   Standing balance support: Bilateral upper extremity supported;Reliant on assistive device for balance Standing balance-Leahy Scale: Poor Standing balance comment: pt stood at sink to wash hands and leaned on counter with trunk for support                            Cognition Arousal/Alertness: Awake/alert Behavior During Therapy: WFL for tasks assessed/performed Overall Cognitive Status: Within Functional Limits for tasks assessed                                          Exercises Other Exercises Other Exercises: talked about isometric abdominal contractions and holding for 10-20 sec in standing, sitting, and supine    General Comments General comments (skin integrity, edema, etc.): VSS, no  drainage from dressing today      Pertinent Vitals/Pain Pain Assessment: 0-10 Pain Score: 6  Pain Location: back,surgical Pain Descriptors / Indicators: Aching Pain Intervention(s): Monitored during session    Home Living                          Prior Function            PT Goals (current goals can now be found  in the care plan section) Progress towards PT goals: Progressing toward goals    Frequency    Min 5X/week      PT Plan Current plan remains appropriate    Co-evaluation              AM-PAC PT "6 Clicks" Mobility   Outcome Measure  Help needed turning from your back to your side while in a flat bed without using bedrails?: A Little Help needed moving from lying on your back to sitting on the side of a flat bed without using bedrails?: A Little Help needed moving to and from a bed to a chair (including a wheelchair)?: A Little Help needed standing up from a chair using your arms (e.g., wheelchair or bedside chair)?: A Little Help needed to walk in hospital room?: A Little Help needed climbing 3-5 steps with a railing? : A Little 6 Click Score: 18    End of Session Equipment Utilized During Treatment: Gait belt Activity Tolerance: Patient tolerated treatment well Patient left: in bed;with call bell/phone within reach;with bed alarm set Nurse Communication: Mobility status PT Visit Diagnosis: Unsteadiness on feet (R26.81);Other abnormalities of gait and mobility (R26.89);Pain;Difficulty in walking, not elsewhere classified (R26.2)     Time: 1610-9604 PT Time Calculation (min) (ACUTE ONLY): 31 min  Charges:  $Gait Training: 8-22 mins $Therapeutic Activity: 8-22 mins                     Kittie Plater, PT, DPT Acute Rehabilitation Services Pager #: [phone] Office #: [phone]    Berline Lopes 07/01/2021, 1:01 PM

## 2021-07-02 NOTE — Progress Notes (Addendum)
Physical Therapy Treatment Patient Details Name: Shannon [name] MRN: [id number] DOB: 07/26/1956 Today's Date: 07/02/2021   History of Present Illness Patient is a 65 year old female S/P L4/L5 L5/S1 sacral fusion on 06/29/2021. At baseline she had L LE numbness and weakness. The numbness has resolved since surgery. PMH: arthritis , constipation, derfpession, DMII, fibromyalgia, Knee pain, restless leg, LE edema, scoliosis, Vitamin D deficiency.    PT Comments    Pt received in supine and eagerly agreeable to therapy session. Pt able to recall 3/3 back precautions. Pt required minimal cues during log rolling and sidelying to/from sitting for hand and feet placement to maintain precautions.  Emphasis during gait on increasing step length for benefits of energy conservation. Educated on use of 3in1 BSC in the home and safe RW techniques. Continue to recommend HHPT upon DC. Pt continues to benefit from PT services to progress toward functional mobility goals.       Recommendations for follow up therapy are one component of a multi-disciplinary discharge planning process, led by the attending physician.  Recommendations may be updated based on patient status, additional functional criteria and insurance authorization.  Follow Up Recommendations  Home health PT     Assistance Recommended at Discharge Intermittent Supervision/Assistance  Equipment Recommendations  Rolling walker (2 wheels);3in1 (PT)    Recommendations for Other Services       Precautions / Restrictions Precautions Precautions: Back;Fall Precaution Booklet Issued: Yes (comment) Precaution Comments: pt with3/3 recall Restrictions Weight Bearing Restrictions: No     Mobility  Bed Mobility Overal bed mobility: Needs Assistance Bed Mobility: Rolling;Sidelying to Sit;Sit to Sidelying Rolling: Min guard Sidelying to sit: Min assist    Sit to sidelying: Min assist General bed mobility comments: Pt required cues to log roll  for BLE and BUE placement   Transfers Overall transfer level: Needs assistance Equipment used: Rolling walker (2 wheels) (RW delivered to room, adjusted to pts height) Transfers: Sit to/from Stand Sit to Stand: Min guard   General transfer comment: Prefers to stand from higher surface height, closer to height of bed at home    Ambulation/Gait Ambulation/Gait assistance: Min guard;Supervision Gait Distance (Feet): 130 Feet Assistive device: Rolling walker (2 wheels) Gait Pattern/deviations: Step-through pattern;Decreased stride length Gait velocity: decreased   General Gait Details: Very slow, steady gait; verbal cues to increase step length     Balance Overall balance assessment: Needs assistance Sitting-balance support: Feet supported Sitting balance-Leahy Scale: Good     Standing balance support: Bilateral upper extremity supported;During functional activity Standing balance-Leahy Scale: Fair     Cognition Arousal/Alertness: Awake/alert Behavior During Therapy: WFL for tasks assessed/performed Overall Cognitive Status: Within Functional Limits for tasks assessed    General Comments: Pt able to recall all back precautions        General Comments General comments (skin integrity, edema, etc.): Pt states pain subsides some once standing, it hurts the worst when she initially sits down; educated on 3 in 1 BSC use at home      Pertinent Vitals/Pain Pain Assessment: 0-10 Pain Score: 7  Pain Location: Lower back/tailbone area Pain Descriptors / Indicators: Aching;Sore Pain Intervention(s): Monitored during session;Limited activity within patient's tolerance     PT Goals (current goals can now be found in the care plan section) Acute Rehab PT Goals Patient Stated Goal: to have less pain in her back PT Goal Formulation: With patient Time For Goal Achievement: 07/06/21 Progress towards PT goals: Progressing toward goals    Frequency  Min 5X/week    PT Plan  Current plan remains appropriate       AM-PAC PT "6 Clicks" Mobility   Outcome Measure  Help needed turning from your back to your side while in a flat bed without using bedrails?: A Little Help needed moving from lying on your back to sitting on the side of a flat bed without using bedrails?: A Little Help needed moving to and from a bed to a chair (including a wheelchair)?: A Little Help needed standing up from a chair using your arms (e.g., wheelchair or bedside chair)?: A Little Help needed to walk in hospital room?: A Little Help needed climbing 3-5 steps with a railing? : A Little 6 Click Score: 18    End of Session Equipment Utilized During Treatment: Gait belt Activity Tolerance: Patient tolerated treatment well Patient left: in bed;with call bell/phone within reach;with bed alarm set Nurse Communication: Mobility status PT Visit Diagnosis: Unsteadiness on feet (R26.81);Other abnormalities of gait and mobility (R26.89);Pain;Difficulty in walking, not elsewhere classified (R26.2)     Time: 8102-5486 PT Time Calculation (min) (ACUTE ONLY): 31 min  Charges:  $Gait Training: 8-22 mins $Therapeutic Activity: 8-22 mins                     Evelene Croon, Student PTA CI: Carly P., PTA  Carly M Poff 07/02/2021, 4:38 PM

## 2021-07-02 NOTE — Progress Notes (Signed)
Patient ID: Shannon [name], female   DOB: 07/18/1956, 65 y.o.   MRN: [id number] BP 129/70   Pulse 90   Temp 97.8 F (36.6 C) (Oral)   Resp 18   Ht 5\' 6"  (1.676 m)   Wt 111.1 kg   SpO2 96%   BMI 39.54 kg/m  Alert and oriented x 4 speech is clear and fluent Moving lower extremities well Wound is clean,dry without signs of infection Doing well, possible discharge tomorrow

## 2021-07-02 NOTE — Plan of Care (Signed)
  Problem: Activity: Goal: Risk for activity intolerance will decrease Outcome: Progressing   Problem: Nutrition: Goal: Adequate nutrition will be maintained Outcome: Progressing   Problem: Pain Managment: Goal: General experience of comfort will improve Outcome: Progressing   Problem: Safety: Goal: Ability to remain free from injury will improve Outcome: Progressing   Problem: Skin Integrity: Goal: Risk for impaired skin integrity will decrease Outcome: Progressing   

## 2021-07-03 MED ORDER — CHLORHEXIDINE GLUCONATE CLOTH 2 % EX PADS
6.0000 | MEDICATED_PAD | Freq: Every day | CUTANEOUS | Status: DC
  Administered 2021-07-03 – 2021-07-05 (×3): 6 via TOPICAL

## 2021-07-03 MED ORDER — INFLUENZA VAC A&B SA ADJ QUAD 0.5 ML IM PRSY
0.5000 mL | PREFILLED_SYRINGE | INTRAMUSCULAR | Status: AC
  Administered 2021-07-04: 0.5 mL via INTRAMUSCULAR
  Filled 2021-07-03: qty 0.5

## 2021-07-03 NOTE — Progress Notes (Signed)
Physical Therapy Treatment Patient Details Name: Shannon [name] MRN: [id number] DOB: December 15, 1955 Today's Date: 07/03/2021   History of Present Illness Patient is a 65 year old female S/P L4/L5 L5/S1 sacral fusion on 06/29/2021. At baseline she had L LE numbness and wekaness. The numbness has resolved since surgery. PMH: arthritis , constipation, derfpession, DMII, fibromyalgia, Knee pain, resltess leg, LE edema, scoliosis, Vitamin D deficiancy,    PT Comments    Pt received in supine, had just been given pain meds, and was agreeable to therapy session. Pt with good carryover from yesterdays session, able to recall 3/3 back precautions and up to supervision assist provided with log rolling for safe UE and LE placement. Progressed to stair training as she uses a step stool to get into her bed at home. Emphasis on not bending over too far, safe hand placement on walker or using HHA, and using the stronger leg to step up. Patient given and demonstrated HEP with enthusiasm to be able to therapy at home. Continue to recommend HHPT upon DC. Pt continues to benefit from PT services to progress toward functional mobility goals.       Recommendations for follow up therapy are one component of a multi-disciplinary discharge planning process, led by the attending physician.  Recommendations may be updated based on patient status, additional functional criteria and insurance authorization.  Follow Up Recommendations  Home health PT     Assistance Recommended at Discharge Intermittent Supervision/Assistance  Equipment Recommendations  Rolling walker (2 wheels);3in1 (PT)    Recommendations for Other Services       Precautions / Restrictions Precautions Precautions: Back;Fall Precaution Booklet Issued: Yes (comment) Precaution Comments: pt with3/3 recall Restrictions Weight Bearing Restrictions: No     Mobility  Bed Mobility Overal bed mobility: Needs Assistance Bed Mobility: Rolling;Sidelying  to Sit;Sit to Sidelying Rolling: Min guard;Supervision Sidelying to sit: Min assist     Sit to sidelying: Min assist General bed mobility comments: Pt with good carryover from yesterdays session including BLE and BUE placement while log rolling    Transfers Overall transfer level: Needs assistance Equipment used: Rolling walker (2 wheels) Transfers: Sit to/from Stand Sit to Stand: Min guard     General transfer comment: Prefers to stand from higher surface height, closer to height of bed at home, while working on STS, gradually lowered bed height by 4" and pt able to stand easier from slightly lower height    Ambulation/Gait Ambulation/Gait assistance: Min Gaffer (Feet): 30 Feet Assistive device: Rolling walker (2 wheels) Gait Pattern/deviations: Step-through pattern;Decreased stride length Gait velocity: decreased   General Gait Details: Very slow, steady gait   Stairs Stairs: Yes Stairs assistance: Mod assist;+2 safety/equipment Stair Management: Forwards;With walker Number of Stairs: 2 General stair comments: Brought 7" step to room to simulate getting on bed as pt uses step stool to get into bed, adjusted height of walker around step so patient not breaking bending precations, difficulty stepping up with RLE, easier on LLE but causes pain at surgical site, educated pt on safe stair techniques as well as including her husband to help her with HHA to get into bed    Balance Overall balance assessment: Needs assistance Sitting-balance support: Feet supported Sitting balance-Leahy Scale: Good     Standing balance support: Bilateral upper extremity supported;During functional activity Standing balance-Leahy Scale: Fair      Cognition Arousal/Alertness: Awake/alert Behavior During Therapy: WFL for tasks assessed/performed Overall Cognitive Status: Within Functional Limits for tasks assessed  General Comments: Pt able to recall all back  precautions        Exercises Other Exercises Other Exercises: STS x5 Other Exercises: LAQ x2 (too painful) Other Exercises: Seated HR/TR x 5 Other Exercises: Standing march x 5 Other Exercises: Standing LAQ x 5 (less painful than seated)    General Comments General comments (skin integrity, edema, etc.): Pt provided HEP consisisting of seated HR/TR, standing march, GS, QS, LAQ; pt demo x1 of each for understanding      Pertinent Vitals/Pain Pain Assessment: 0-10 Pain Score: 8  (8/10 upon arrival, dropped to 7/10 once standing) Pain Location: Lower back/tailbone area Pain Descriptors / Indicators: Aching;Sore Pain Intervention(s): Monitored during session;Premedicated before session;Limited activity within patient's tolerance     PT Goals (current goals can now be found in the care plan section) Acute Rehab PT Goals Patient Stated Goal: to have less pain in her back PT Goal Formulation: With patient Time For Goal Achievement: 07/06/21    Frequency    Min 5X/week      PT Plan Current plan remains appropriate       AM-PAC PT "6 Clicks" Mobility   Outcome Measure  Help needed turning from your back to your side while in a flat bed without using bedrails?: A Little Help needed moving from lying on your back to sitting on the side of a flat bed without using bedrails?: A Little Help needed moving to and from a bed to a chair (including a wheelchair)?: A Little Help needed standing up from a chair using your arms (e.g., wheelchair or bedside chair)?: A Little Help needed to walk in hospital room?: A Little Help needed climbing 3-5 steps with a railing? : A Little 6 Click Score: 18    End of Session Equipment Utilized During Treatment: Gait belt Activity Tolerance: Patient tolerated treatment well Patient left: in bed;with call bell/phone within reach;with bed alarm set Nurse Communication: Mobility status PT Visit Diagnosis: Unsteadiness on feet (R26.81);Other  abnormalities of gait and mobility (R26.89);Pain;Difficulty in walking, not elsewhere classified (R26.2)     Time: 2671-2458 PT Time Calculation (min) (ACUTE ONLY): 40 min  Charges:  $Gait Training: 8-22 mins $Therapeutic Exercise: 8-22 mins $Therapeutic Activity: 8-22 mins                     Evelene Croon, Student PTA CI: Carly P., PTA   Evelene Croon 07/03/2021, 12:58 PM

## 2021-07-03 NOTE — Plan of Care (Signed)
  Problem: Pain Managment: Goal: General experience of comfort will improve Outcome: Progressing   Problem: Safety: Goal: Ability to remain free from injury will improve Outcome: Progressing   Problem: Skin Integrity: Goal: Risk for impaired skin integrity will decrease Outcome: Progressing   

## 2021-07-03 NOTE — Progress Notes (Signed)
Patient ID: Shannon [name], female   DOB: 22-Oct-1955, 65 y.o.   MRN: [id number] BP (!) 122/47 (BP Location: Right Arm)   Pulse 87   Temp 98.6 F (37 C) (Oral)   Resp 17   Ht 5\' 6"  (1.676 m)   Wt 111.1 kg   SpO2 100%   BMI 39.54 kg/m  Alert and oriented x 4, moving all extremities well Wound dressing is dry and intact.  Would prefer leaving on Friday.

## 2021-07-04 NOTE — Progress Notes (Signed)
Occupational Therapy Treatment Patient Details Name: Shannon [name] MRN: [id number] DOB: 04-30-1956 Today's Date: 07/04/2021   History of present illness Patient is a 65 year old female S/P L4/L5 L5/S1 sacral fusion on 06/29/2021. At baseline she had L LE numbness and wekaness. The numbness has resolved since surgery. PMH: arthritis , constipation, derfpession, DMII, fibromyalgia, Knee pain, resltess leg, LE edema, scoliosis, Vitamin D deficiancy,   OT comments  Pt making good progress with OT goals this session. Pt with increased strength, mobility, and activity tolerance. Addtionally she is doing well with following her back precautions in functional tasks, such as grooming and toileting. OT will continue to follow acutely addressing strength, safety, and ADL performance.    Recommendations for follow up therapy are one component of a multi-disciplinary discharge planning process, led by the attending physician.  Recommendations may be updated based on patient status, additional functional criteria and insurance authorization.    Follow Up Recommendations  Home health OT    Assistance Recommended at Discharge Set up Supervision/Assistance  Equipment Recommendations  BSC;Tub/shower bench;Other (comment)    Recommendations for Other Services      Precautions / Restrictions Precautions Precautions: Back;Fall Precaution Comments: pt with 3/3 recall Restrictions Weight Bearing Restrictions: No       Mobility Bed Mobility               General bed mobility comments: Up in bathroom    Transfers Overall transfer level: Needs assistance Equipment used: Rolling walker (2 wheels) Transfers: Sit to/from Stand Sit to Stand: Min guard           General transfer comment: From toilet and recliner with no difficulties     Balance Overall balance assessment: Needs assistance Sitting-balance support: Feet supported Sitting balance-Leahy Scale: Good     Standing balance  support: Single extremity supported;During functional activity Standing balance-Leahy Scale: Fair Standing balance comment: pt stood at sink to wash hands and leaned on counter with trunk for support                           ADL either performed or assessed with clinical judgement   ADL Overall ADL's : Needs assistance/impaired     Grooming: Wash/dry hands;Wash/dry face;Min guard;Standing Grooming Details (indicate cue type and reason): completed at sink, following her precautions                 Toilet Transfer: Min guard;Ambulation Toilet Transfer Details (indicate cue type and reason): no physical assistance needed Toileting- Clothing Manipulation and Hygiene: Min guard;Cueing for compensatory techniques;Cueing for back precautions Toileting - Clothing Manipulation Details (indicate cue type and reason): Pt completed with no difficulties, following precautions     Functional mobility during ADLs: Min guard;Supervision/safety;Rolling walker (2 wheels) General ADL Comments: Pt initially reporting stiffness in her back requiring min guard for safety and steadying, as she continued to move she became more stable requiring only supervision     Vision       Perception     Praxis      Cognition Arousal/Alertness: Awake/alert Behavior During Therapy: WFL for tasks assessed/performed Overall Cognitive Status: Within Functional Limits for tasks assessed                                            Exercises     Shoulder Instructions  General Comments      Pertinent Vitals/ Pain       Pain Assessment: No/denies pain  Home Living                                          Prior Functioning/Environment              Frequency  Min 2X/week        Progress Toward Goals  OT Goals(current goals can now be found in the care plan section)  Progress towards OT goals: Progressing toward goals  Acute Rehab OT  Goals Patient Stated Goal: To not have any more pain OT Goal Formulation: With patient Time For Goal Achievement: 07/13/21 Potential to Achieve Goals: Good ADL Goals Pt Will Perform Lower Body Bathing: with modified independence;with adaptive equipment;sitting/lateral leans;sit to/from stand Pt Will Perform Lower Body Dressing: with modified independence;with adaptive equipment;sitting/lateral leans;sit to/from stand Pt Will Transfer to Toilet: with modified independence;ambulating Pt Will Perform Toileting - Clothing Manipulation and hygiene: with modified independence;with adaptive equipment;sitting/lateral leans;sit to/from stand Additional ADL Goal #1: Pt will follow 3 out of 3 back precautions 100% of the time, independently.  Plan Discharge plan remains appropriate    Co-evaluation                 AM-PAC OT "6 Clicks" Daily Activity     Outcome Measure   Help from another person eating meals?: None Help from another person taking care of personal grooming?: A Little Help from another person toileting, which includes using toliet, bedpan, or urinal?: A Little Help from another person bathing (including washing, rinsing, drying)?: A Little Help from another person to put on and taking off regular upper body clothing?: A Little Help from another person to put on and taking off regular lower body clothing?: A Lot 6 Click Score: 18    End of Session Equipment Utilized During Treatment: Rolling walker (2 wheels)  OT Visit Diagnosis: Unsteadiness on feet (R26.81);Other abnormalities of gait and mobility (R26.89);Muscle weakness (generalized) (M62.81)   Activity Tolerance Patient tolerated treatment well   Patient Left Other (comment) (On toilet, RN and NT aware)   Nurse Communication Mobility status        Time: 6384-5364 OT Time Calculation (min): 25 min  Charges: OT General Charges $OT Visit: 1 Visit OT Treatments $Self Care/Home Management : 8-22  mins $Therapeutic Activity: 8-22 mins  Kimara Bencomo H., OTR/L Acute Rehabilitation  Wiliam Cauthorn Elane Mitchell Iwanicki 07/04/2021, 6:02 PM

## 2021-07-04 NOTE — Progress Notes (Signed)
Patient ID: Shannon [name], female   DOB: 05/04/1956, 65 y.o.   MRN: [id number] BP (!) 115/59 (BP Location: Left Arm)   Pulse 89   Temp 98.4 F (36.9 C) (Oral)   Resp 18   Ht 5\' 6"  (1.676 m)   Wt 111.1 kg   SpO2 96%   BMI 39.54 kg/m  Alert and oriented x 4, speech is clear and fluent Moving lower extremities Wound is clean, dry, without signs of infection Discharge tomorrow.

## 2021-07-04 NOTE — Plan of Care (Signed)
  Problem: Activity: Goal: Risk for activity intolerance will decrease Outcome: Progressing   Problem: Nutrition: Goal: Adequate nutrition will be maintained Outcome: Progressing   Problem: Pain Managment: Goal: General experience of comfort will improve Outcome: Progressing   Problem: Safety: Goal: Ability to remain free from injury will improve Outcome: Progressing   Problem: Skin Integrity: Goal: Risk for impaired skin integrity will decrease Outcome: Progressing   

## 2021-07-04 NOTE — Progress Notes (Signed)
Physical Therapy Treatment Patient Details Name: Shannon [name] MRN: [id number] DOB: [birth year] Today's Date: 07/04/2021   History of Present Illness Patient is a 65 year old female S/P L4/L5 L5/S1 sacral fusion on 06/29/2021. At baseline she had L LE numbness and wekaness. The numbness has resolved since surgery. PMH: arthritis , constipation, derfpession, DMII, fibromyalgia, Knee pain, resltess leg, LE edema, scoliosis, Vitamin D deficiancy,    PT Comments    Pt received in supine, tearful with c/o severe LBP and LLE radiating pain despite premedication and unable to progress to EOB/OOB mobility this session due to pain despite repositioning. Emphasis on log rolling, bed mobility/repositioning for comfort, use of cryotherapy to reduce pain/inflammation along with frequency and skin protection, supine LE exercises as tolerated and importance of OOB with therapy/nursing staff to chair once pain meds/muscle relaxers "kick in". Pt continues to benefit from PT services to progress toward functional mobility goals.    Recommendations for follow up therapy are one component of a multi-disciplinary discharge planning process, led by the attending physician.  Recommendations may be updated based on patient status, additional functional criteria and insurance authorization.  Follow Up Recommendations  Home health PT     Assistance Recommended at Discharge Intermittent Supervision/Assistance  Equipment Recommendations  Rolling walker (2 wheels);3in1 (PT)    Recommendations for Other Services       Precautions / Restrictions Precautions Precautions: Back;Fall Precaution Comments: pt with 3/3 recall Restrictions Weight Bearing Restrictions: No     Mobility  Bed Mobility Overal bed mobility: Needs Assistance Bed Mobility: Rolling Rolling: Min assist         General bed mobility comments: heavy cues for safe sequencing due to increased pain, positioned further to L for relief of sacral  area and placement of ice pack over incision site to see if this will improve radiating pain; pt reports only minimal improvement; BLE floated on pillows for pressure/pain relief    Transfers                   General transfer comment: defer, pt with intractable pain and crying despite repositioning and premedication        Balance       Sitting balance - Comments: not assessed due to bed-level session              Cognition Arousal/Alertness: Awake/alert Behavior During Therapy: Anxious;Restless (crying) Overall Cognitive Status: Within Functional Limits for tasks assessed            General Comments: Pt able to recall log rolling when cued but internally distracted due to pain and crying throughout. RN called to room to assess and reports she will get muscle relaxers for patient. Pt agreeable to limited bed level repositioning only due to pain.        Exercises Other Exercises Other Exercises: supine AROM ankle pumps x10 reps ea    General Comments General comments (skin integrity, edema, etc.): VSS on RA, incision c/d/i, ice pack in place with instructions to leave on/off every 30 mins as tolerated      Pertinent Vitals/Pain Pain Assessment: Faces Faces Pain Scale: Hurts worst Pain Location: LLE>RLE and LBP Pain Descriptors / Indicators: Aching;Sore;Burning;Constant;Crying;Discomfort;Guarding Pain Intervention(s): Limited activity within patient's tolerance;Monitored during session;Premedicated before session;Repositioned;Ice applied;Patient requesting pain meds-RN notified (pt requesting muscle relaxer, per RN she had 2 types of pain meds ~30 mins prior to session)     PT Goals (current goals can now be found in the care plan  section) Acute Rehab PT Goals Patient Stated Goal: to have less pain in her back PT Goal Formulation: With patient Time For Goal Achievement: 07/06/21 Progress towards PT goals: Not progressing toward goals - comment (pain  limiting today; will reassess next session)    Frequency    Min 5X/week      PT Plan Current plan remains appropriate       AM-PAC PT "6 Clicks" Mobility   Outcome Measure  Help needed turning from your back to your side while in a flat bed without using bedrails?: A Little Help needed moving from lying on your back to sitting on the side of a flat bed without using bedrails?: A Lot Help needed moving to and from a bed to a chair (including a wheelchair)?: A Lot Help needed standing up from a chair using your arms (e.g., wheelchair or bedside chair)?: Total Help needed to walk in hospital room?: Total Help needed climbing 3-5 steps with a railing? : Total 6 Click Score: 10    End of Session   Activity Tolerance: Patient limited by pain Patient left: in bed;with call bell/phone within reach;with bed alarm set (sidelying to L) Nurse Communication: Mobility status;Patient requests pain meds;Other (comment) (pt tearful/overwhelmed by pain ~30 mins after pain meds given) PT Visit Diagnosis: Unsteadiness on feet (R26.81);Other abnormalities of gait and mobility (R26.89);Pain;Difficulty in walking, not elsewhere classified (R26.2) Pain - Right/Left: Left Pain - part of body: Leg (and LBP incisional)     Time: 6213-0865 PT Time Calculation (min) (ACUTE ONLY): 16 min  Charges:  $Therapeutic Activity: 8-22 mins                     Siham Bucaro P., PTA Acute Rehabilitation Services Pager: [phone] Office: [place] 07/04/2021, 2:30 PM

## 2021-07-05 DIAGNOSIS — Z23 Encounter for immunization: Secondary | ICD-10-CM | POA: Diagnosis not present

## 2021-07-05 MED ORDER — TIZANIDINE HCL 4 MG PO TABS: 4.0000 mg | ORAL_TABLET | Freq: Four times a day (QID) | ORAL | 0 refills | Status: DC | PRN

## 2021-07-05 MED ORDER — OXYCODONE HCL 5 MG PO TABS: 5.0000 mg | ORAL_TABLET | Freq: Four times a day (QID) | ORAL | 0 refills | Status: AC | PRN

## 2021-07-05 MED ORDER — HEPARIN SOD (PORK) LOCK FLUSH 100 UNIT/ML IV SOLN
500.0000 [IU] | Freq: Once | INTRAVENOUS | Status: DC
  Filled 2021-07-05: qty 5

## 2021-07-05 NOTE — Discharge Instructions (Signed)

## 2021-07-05 NOTE — Progress Notes (Addendum)
Physical Therapy Treatment Patient Details Name: Shannon [name] MRN: [id number] DOB: 1956/02/20 Today's Date: 07/05/2021   History of Present Illness Patient is a 65 year old female S/P L4/L5 L5/S1 sacral fusion on 06/29/2021. At baseline she had L LE numbness and wekaness. The numbness has resolved since surgery. PMH: arthritis , constipation, derfpession, DMII, fibromyalgia, Knee pain, resltess leg, LE edema, scoliosis, Vitamin D deficiancy,    PT Comments    Pt received in supine, sleeping initially but easily awoken and agreeable to session once alert. Pt progressed stair climbing with variable height stairs in PT gym with no LOB and slow and steady steps. Pt does well with household gait tasks, just demonstrates decreased gait speed (grossly <0.4 m/s) which indicates increased risk of falls for household ambulation tasks. Pt with good recall 3/3 of back precautions. At end of session pt wanted to remain in bathroom, verbalized understanding to pull cord when finished to receive help back to chair, however pt did not do this when staff returned to check on her she was already mobilizing to chair, so placed chair alarm on for safety. Continue to recommend HHPT upon DC. Pt continues to benefit from PT services to progress toward functional mobility goals.      Recommendations for follow up therapy are one component of a multi-disciplinary discharge planning process, led by the attending physician.  Recommendations may be updated based on patient status, additional functional criteria and insurance authorization.  Follow Up Recommendations  Home health PT     Assistance Recommended at Discharge Intermittent Supervision/Assistance  Equipment Recommendations  Rolling walker (2 wheels);3in1 (PT)    Recommendations for Other Services       Precautions / Restrictions Precautions Precautions: Back;Fall Precaution Comments: pt with 3/3 recall Restrictions Weight Bearing Restrictions: No      Mobility  Bed Mobility Overal bed mobility: Needs Assistance Bed Mobility: Rolling Rolling: Supervision Sidelying to sit: Supervision   General bed mobility comments: No physical assist needed, cues for BLE management as pt was diagonal in bed    Transfers Overall transfer level: Needs assistance Equipment used: Rolling walker (2 wheels) Transfers: Sit to/from Stand Sit to Stand: Supervision;Min guard   General transfer comment: Up to minG for safety and hand placement    Ambulation/Gait Ambulation/Gait assistance: Min guard;Supervision Gait Distance (Feet): 150 Feet Assistive device: Rolling walker (2 wheels) Gait Pattern/deviations: Step-through pattern;Decreased stride length Gait velocity: decreased   General Gait Details: Very slow, steady gait   Stairs Stairs: Yes Stairs assistance: Min assist;+2 safety/equipment;+2 physical assistance Stair Management: Forwards;Two rails (up once HHA, second time using both hand rails) Number of Stairs: 5 General stair comments: Utilized variable height steps in PT gym, performed with HHA +2 with up to minA, no LOB, cues for sequencing, also performed with both hand rails and minG-minA for safety      Balance Overall balance assessment: Needs assistance Sitting-balance support: Feet supported Sitting balance-Leahy Scale: Good     Standing balance support: Bilateral upper extremity supported;During functional activity Standing balance-Leahy Scale: Fair         Cognition Arousal/Alertness: Awake/alert Behavior During Therapy: WFL for tasks assessed/performed (crying) Overall Cognitive Status: Within Functional Limits for tasks assessed     General Comments: Pt able to recall precautions and log rolling, needed some cues for management of LE              Pertinent Vitals/Pain Pain Assessment: Faces Faces Pain Scale: Hurts even more Pain Location: Lower back, LLE  Pain Descriptors / Indicators:  Aching;Sore;Burning;Constant;Crying;Discomfort;Guarding Pain Intervention(s): Monitored during session;Limited activity within patient's tolerance     PT Goals (current goals can now be found in the care plan section) Acute Rehab PT Goals Patient Stated Goal: to have less pain in her back PT Goal Formulation: With patient Time For Goal Achievement: 07/06/21 Progress towards PT goals: Progressing toward goals    Frequency    Min 5X/week      PT Plan Current plan remains appropriate       AM-PAC PT "6 Clicks" Mobility   Outcome Measure  Help needed turning from your back to your side while in a flat bed without using bedrails?: A Little Help needed moving from lying on your back to sitting on the side of a flat bed without using bedrails?: A Little Help needed moving to and from a bed to a chair (including a wheelchair)?: A Little Help needed standing up from a chair using your arms (e.g., wheelchair or bedside chair)?: A Little Help needed to walk in hospital room?: A Little Help needed climbing 3-5 steps with a railing? : A Lot 6 Click Score: 17    End of Session Equipment Utilized During Treatment: Gait belt Activity Tolerance: Patient tolerated treatment well Patient left:  (in RR with instructions to pull cord when ready to get up) Nurse Communication: Mobility status;Other (comment) (Pt wanted to stay in RR for a few minutes, verbalized understanding to pull cord when wanting to get up) PT Visit Diagnosis: Unsteadiness on feet (R26.81);Other abnormalities of gait and mobility (R26.89);Pain;Difficulty in walking, not elsewhere classified (R26.2) Pain - part of body:  (and LBP incisional)     Time: 9532-0233 PT Time Calculation (min) (ACUTE ONLY): 23 min  Charges:  $Gait Training: 23-37 mins                     Evelene Croon, Student PTA CI: Carly P., PTA  Carly M Poff 07/05/2021, 2:46 PM

## 2021-07-05 NOTE — Plan of Care (Signed)
  Problem: Activity: Goal: Risk for activity intolerance will decrease Outcome: Progressing   Problem: Nutrition: Goal: Adequate nutrition will be maintained Outcome: Progressing   Problem: Pain Managment: Goal: General experience of comfort will improve Outcome: Progressing   Problem: Safety: Goal: Ability to remain free from injury will improve Outcome: Progressing   Problem: Skin Integrity: Goal: Risk for impaired skin integrity will decrease Outcome: Progressing   

## 2021-07-05 NOTE — Discharge Summary (Signed)
Physician Discharge Summary  Patient ID: Shannon [name] MRN: [id number] DOB/AGE: [birth year] 65 y.o.  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 Post op she is voiding, ambulating, and tolerating a regular diet She has worked well with physical therapy, and is ready for discharge. Treatments: surgery: Lumbar four-five, Lumbar five-Sacral one Posterior lumbar interbody fusion, [place] cages, titanium, filled with autograft morsels 2 93mmx22mm cages st L4/5, 2 56mmx22mm cages at L5/S1 Laminectomy L4, L5 in excess of needed exposure for a PLIF to decompress the neural foramina, and lateral recesses Segmental pedicle screw fixation L4-S1(nuvasive Relign)    Discharge Exam: Blood pressure (!) 113/54, pulse 92, temperature 97.6 F (36.4 C), temperature source Oral, resp. rate 17, height 5\' 6"  (1.676 m), weight 111.1 kg, SpO2 100 %. General appearance: alert, cooperative, appears stated age, and mild distress  Disposition: Discharge disposition: 01-Home or Self Care      Spondylolisthesis, Lumbar region  Allergies as of 07/05/2021       Reactions   Propoxyphene N-acetaminophen Nausea And Vomiting   Prednisone Rash        Medication List     TAKE these medications    acetaminophen 650 MG CR tablet Commonly known as: TYLENOL Take 1,300 mg by mouth every 8 (eight) hours as needed for pain.   acidophilus Caps capsule Take 1 capsule by mouth daily.   amLODipine 5 MG  tablet Commonly known as: NORVASC Take 5 mg by mouth daily.   aspirin-acetaminophen-caffeine 250-250-65 MG tablet Commonly known as: EXCEDRIN MIGRAINE Take 2 tablets by mouth every 6 (six) hours as needed for headache or migraine.   DULoxetine 20 MG capsule Commonly known as: CYMBALTA Take 20 mg by mouth daily.   Fish Oil 1000 MG Caps Take 1,000 mg by mouth daily.   gabapentin 300 MG capsule Commonly known as: NEURONTIN Take 300 mg by mouth at bedtime.   hydrochlorothiazide 12.5 MG tablet Commonly known as: HYDRODIURIL Take 12.5 mg by mouth every morning.   losartan 100 MG tablet Commonly known as: COZAAR Take 100 mg by mouth every morning.   meclizine 25 MG tablet Commonly known as: ANTIVERT Take 25 mg by mouth 2 (two) times daily as needed for dizziness.   oxyCODONE 5 MG immediate release tablet Commonly known as: Oxy IR/ROXICODONE Take 1 tablet (5 mg total) by mouth every 6 (six) hours as needed for up to 8 days for moderate pain ((score 4 to 6)).   pantoprazole 20 MG tablet Commonly known as: PROTONIX Take 1 tablet (20 mg total) by mouth daily.   potassium chloride SA 20 MEQ tablet Commonly known as: KLOR-CON Take 20 mEq by mouth daily.   rOPINIRole 0.5 MG tablet Commonly known as: REQUIP Take 0.5 mg by mouth at bedtime.   Semaglutide (1 MG/DOSE) 4 MG/3ML Sopn Inject 1 mg as directed once a week.   tiZANidine 4 MG tablet Commonly known as: ZANAFLEX Take 1 tablet (4 mg total) by mouth every 6 (six) hours as needed for muscle spasms.   Vitamin D 125 MCG (5000 UT) Caps Take 5,000 Units  by mouth daily.               Durable Medical Equipment  (From admission, onward)           Start     Ordered   07/01/21 1113  For home use only DME 3 n 1  Once        07/01/21 1113   07/01/21 1113  For home use only DME Walker rolling  Once       Question Answer Comment  Walker: With Silvana Wheels   Patient needs a walker to treat with the following  condition S/P lumbar fusion      07/01/21 1113            Follow-up [place], [place] Follow up.   Specialty: Home Health Services Why: THe home health agency will contact you for the first home visit. Contact information: [place] [address] [phone]         Ashok Pall, MD Follow up in 3 week(s).   Specialty: Neurosurgery Why: please call to make an appointment Contact information: [address] [place] [address] [phone]                 Signed: Ashok Pall 07/05/2021, 4:55 PM

## 2021-07-05 NOTE — Progress Notes (Signed)
Occupational Therapy Treatment Patient Details Name: Shannon [name] MRN: [id number] DOB: 12-20-1955 Today's Date: 07/05/2021   History of present illness Patient is a 65 year old female S/P L4/L5 L5/S1 sacral fusion on 06/29/2021. At baseline she had L LE numbness and wekaness. The numbness has resolved since surgery. PMH: arthritis , constipation, derfpession, DMII, fibromyalgia, Knee pain, resltess leg, LE edema, scoliosis, Vitamin D deficiancy,   OT comments  Pt doing well with OT goals this session. She participated in practicing LB ADL's in sitting and standing, following all Spinal precautions. Pt overall needing no physical assist for ADL's, minimal verbal cuing to follow precautions at times with Mobility/ADL's. Due to pain this session, ambulation limited to in room, RN notified. OT will continue to follow acutely.    Recommendations for follow up therapy are one component of a multi-disciplinary discharge planning process, led by the attending physician.  Recommendations may be updated based on patient status, additional functional criteria and insurance authorization.    Follow Up Recommendations  Home health OT    Assistance Recommended at Discharge Set up Supervision/Assistance  Equipment Recommendations  BSC;Tub/shower bench;Other (comment)    Recommendations for Other Services      Precautions / Restrictions Precautions Precautions: Back;Fall Precaution Comments: pt with 3/3 recall Restrictions Weight Bearing Restrictions: No       Mobility Bed Mobility Overal bed mobility: Modified Independent Bed Mobility: Rolling;Sidelying to Sit Rolling: Modified independent (Device/Increase time) Sidelying to sit: Modified independent (Device/Increase time)       General bed mobility comments: No assist needed, pt completed log rolls with no difficulty    Transfers Overall transfer level: Needs assistance Equipment used: Rolling walker (2 wheels) Transfers: Sit  to/from Stand Sit to Stand: Supervision           General transfer comment: Sup for safety frombed     Balance Overall balance assessment: Needs assistance Sitting-balance support: Feet supported Sitting balance-Leahy Scale: Good     Standing balance support: No upper extremity supported;During functional activity Standing balance-Leahy Scale: Fair Standing balance comment: Pt stood to demonstrate bathing, no overt balance concerns.                           ADL either performed or assessed with clinical judgement   ADL Overall ADL's : Needs assistance/impaired     Grooming: Wash/dry hands;Supervision/safety;Standing Grooming Details (indicate cue type and reason): completed at sink, following her precautions     Lower Body Bathing: Supervison/ safety;Set up;Sitting/lateral leans;Sit to/from stand Lower Body Bathing Details (indicate cue type and reason): educated on use of long sponge for LB bathing, pt practiced sitting to clean her feet and compensatory techniques for pericare.     Lower Body Dressing: Set up;Sitting/lateral leans;Sit to/from stand Lower Body Dressing Details (indicate cue type and reason): Pt worked on modified figure 4 positioning to dress LB while sitting, before coming to stand to finish pulling up clothes. Toilet Transfer: Min guard;Ambulation Toilet Transfer Details (indicate cue type and reason): no physical assistance needed Toileting- Clothing Manipulation and Hygiene: Min guard;Cueing for compensatory techniques;Cueing for back precautions Toileting - Clothing Manipulation Details (indicate cue type and reason): Pt completed with no difficulties, following precautions     Functional mobility during ADLs: Supervision/safety;Min guard;Rolling walker (2 wheels) General ADL Comments: Pt having pain in BLE this session, requiring min guard for safety with all mobility. Additionally, pt was educated on all LB ADL's to ensure safety and  following back precautions.     Vision       Perception     Praxis      Cognition Arousal/Alertness: Awake/alert Behavior During Therapy: WFL for tasks assessed/performed Overall Cognitive Status: Within Functional Limits for tasks assessed                                            Exercises     Shoulder Instructions       General Comments      Pertinent Vitals/ Pain       Pain Assessment: 0-10 Pain Score: 6  Pain Location: LLE>RLE and LBP Pain Descriptors / Indicators: Aching;Discomfort;Sore Pain Intervention(s): Limited activity within patient's tolerance;Monitored during session;Repositioned  Home Living                                          Prior Functioning/Environment              Frequency  Min 2X/week        Progress Toward Goals  OT Goals(current goals can now be found in the care plan section)  Progress towards OT goals: Progressing toward goals  Acute Rehab OT Goals Patient Stated Goal: To go home today OT Goal Formulation: With patient Time For Goal Achievement: 07/13/21 Potential to Achieve Goals: Good ADL Goals Pt Will Perform Lower Body Bathing: with modified independence;with adaptive equipment;sitting/lateral leans;sit to/from stand Pt Will Perform Lower Body Dressing: with modified independence;with adaptive equipment;sitting/lateral leans;sit to/from stand Pt Will Transfer to Toilet: with modified independence;ambulating Pt Will Perform Toileting - Clothing Manipulation and hygiene: with modified independence;with adaptive equipment;sitting/lateral leans;sit to/from stand Additional ADL Goal #1: Pt will follow 3 out of 3 back precautions 100% of the time, independently.  Plan Discharge plan remains appropriate    Co-evaluation                 AM-PAC OT "6 Clicks" Daily Activity     Outcome Measure   Help from another person eating meals?: None Help from another person taking  care of personal grooming?: A Little Help from another person toileting, which includes using toliet, bedpan, or urinal?: A Little Help from another person bathing (including washing, rinsing, drying)?: A Little Help from another person to put on and taking off regular upper body clothing?: None Help from another person to put on and taking off regular lower body clothing?: A Little 6 Click Score: 20    End of Session Equipment Utilized During Treatment: Rolling walker (2 wheels)  OT Visit Diagnosis: Unsteadiness on feet (R26.81);Other abnormalities of gait and mobility (R26.89);Muscle weakness (generalized) (M62.81)   Activity Tolerance Patient tolerated treatment well   Patient Left in bed;with call bell/phone within reach   Nurse Communication Mobility status;Patient requests pain meds        Time: 4403-4742 OT Time Calculation (min): 24 min  Charges: OT General Charges $OT Visit: 1 Visit OT Treatments $Self Care/Home Management : 23-37 mins  Ashtan Laton H., OTR/L Acute Rehabilitation  Eleanor Gatliff Elane Atarah Cadogan 07/05/2021, 10:51 AM

## 2021-07-06 DIAGNOSIS — G894 Chronic pain syndrome: Secondary | ICD-10-CM | POA: Diagnosis not present

## 2021-07-06 DIAGNOSIS — M48061 Spinal stenosis, lumbar region without neurogenic claudication: Secondary | ICD-10-CM | POA: Diagnosis not present

## 2021-07-06 DIAGNOSIS — M9973 Connective tissue and disc stenosis of intervertebral foramina of lumbar region: Secondary | ICD-10-CM | POA: Diagnosis not present

## 2021-07-06 DIAGNOSIS — M4316 Spondylolisthesis, lumbar region: Secondary | ICD-10-CM | POA: Diagnosis not present

## 2021-07-06 DIAGNOSIS — M1990 Unspecified osteoarthritis, unspecified site: Secondary | ICD-10-CM | POA: Diagnosis not present

## 2021-07-06 DIAGNOSIS — E1159 Type 2 diabetes mellitus with other circulatory complications: Secondary | ICD-10-CM | POA: Diagnosis not present

## 2021-07-06 DIAGNOSIS — M797 Fibromyalgia: Secondary | ICD-10-CM | POA: Diagnosis not present

## 2021-07-06 DIAGNOSIS — Z4789 Encounter for other orthopedic aftercare: Secondary | ICD-10-CM | POA: Diagnosis not present

## 2021-07-06 DIAGNOSIS — I152 Hypertension secondary to endocrine disorders: Secondary | ICD-10-CM | POA: Diagnosis not present

## 2021-07-06 DIAGNOSIS — M419 Scoliosis, unspecified: Secondary | ICD-10-CM | POA: Diagnosis not present

## 2021-07-08 DIAGNOSIS — M48061 Spinal stenosis, lumbar region without neurogenic claudication: Secondary | ICD-10-CM | POA: Diagnosis not present

## 2021-07-08 DIAGNOSIS — I152 Hypertension secondary to endocrine disorders: Secondary | ICD-10-CM | POA: Diagnosis not present

## 2021-07-08 DIAGNOSIS — M797 Fibromyalgia: Secondary | ICD-10-CM | POA: Diagnosis not present

## 2021-07-08 DIAGNOSIS — M4316 Spondylolisthesis, lumbar region: Secondary | ICD-10-CM | POA: Diagnosis not present

## 2021-07-08 DIAGNOSIS — M1990 Unspecified osteoarthritis, unspecified site: Secondary | ICD-10-CM | POA: Diagnosis not present

## 2021-07-08 DIAGNOSIS — M419 Scoliosis, unspecified: Secondary | ICD-10-CM | POA: Diagnosis not present

## 2021-07-08 DIAGNOSIS — E1159 Type 2 diabetes mellitus with other circulatory complications: Secondary | ICD-10-CM | POA: Diagnosis not present

## 2021-07-08 DIAGNOSIS — M9973 Connective tissue and disc stenosis of intervertebral foramina of lumbar region: Secondary | ICD-10-CM | POA: Diagnosis not present

## 2021-07-08 DIAGNOSIS — G894 Chronic pain syndrome: Secondary | ICD-10-CM | POA: Diagnosis not present

## 2021-07-08 DIAGNOSIS — Z4789 Encounter for other orthopedic aftercare: Secondary | ICD-10-CM | POA: Diagnosis not present

## 2021-07-11 DIAGNOSIS — M1990 Unspecified osteoarthritis, unspecified site: Secondary | ICD-10-CM | POA: Diagnosis not present

## 2021-07-11 DIAGNOSIS — M797 Fibromyalgia: Secondary | ICD-10-CM | POA: Diagnosis not present

## 2021-07-11 DIAGNOSIS — M4316 Spondylolisthesis, lumbar region: Secondary | ICD-10-CM | POA: Diagnosis not present

## 2021-07-11 DIAGNOSIS — Z4789 Encounter for other orthopedic aftercare: Secondary | ICD-10-CM | POA: Diagnosis not present

## 2021-07-11 DIAGNOSIS — M9973 Connective tissue and disc stenosis of intervertebral foramina of lumbar region: Secondary | ICD-10-CM | POA: Diagnosis not present

## 2021-07-11 DIAGNOSIS — G894 Chronic pain syndrome: Secondary | ICD-10-CM | POA: Diagnosis not present

## 2021-07-11 DIAGNOSIS — M48061 Spinal stenosis, lumbar region without neurogenic claudication: Secondary | ICD-10-CM | POA: Diagnosis not present

## 2021-07-11 DIAGNOSIS — M419 Scoliosis, unspecified: Secondary | ICD-10-CM | POA: Diagnosis not present

## 2021-07-11 DIAGNOSIS — E1159 Type 2 diabetes mellitus with other circulatory complications: Secondary | ICD-10-CM | POA: Diagnosis not present

## 2021-07-11 DIAGNOSIS — I152 Hypertension secondary to endocrine disorders: Secondary | ICD-10-CM | POA: Diagnosis not present

## 2021-07-12 DIAGNOSIS — M1990 Unspecified osteoarthritis, unspecified site: Secondary | ICD-10-CM | POA: Diagnosis not present

## 2021-07-12 DIAGNOSIS — M4316 Spondylolisthesis, lumbar region: Secondary | ICD-10-CM | POA: Diagnosis not present

## 2021-07-12 DIAGNOSIS — M9973 Connective tissue and disc stenosis of intervertebral foramina of lumbar region: Secondary | ICD-10-CM | POA: Diagnosis not present

## 2021-07-12 DIAGNOSIS — M419 Scoliosis, unspecified: Secondary | ICD-10-CM | POA: Diagnosis not present

## 2021-07-12 DIAGNOSIS — I152 Hypertension secondary to endocrine disorders: Secondary | ICD-10-CM | POA: Diagnosis not present

## 2021-07-12 DIAGNOSIS — M48061 Spinal stenosis, lumbar region without neurogenic claudication: Secondary | ICD-10-CM | POA: Diagnosis not present

## 2021-07-12 DIAGNOSIS — G894 Chronic pain syndrome: Secondary | ICD-10-CM | POA: Diagnosis not present

## 2021-07-12 DIAGNOSIS — M797 Fibromyalgia: Secondary | ICD-10-CM | POA: Diagnosis not present

## 2021-07-12 DIAGNOSIS — E1159 Type 2 diabetes mellitus with other circulatory complications: Secondary | ICD-10-CM | POA: Diagnosis not present

## 2021-07-12 DIAGNOSIS — Z4789 Encounter for other orthopedic aftercare: Secondary | ICD-10-CM | POA: Diagnosis not present

## 2021-07-13 DIAGNOSIS — M1990 Unspecified osteoarthritis, unspecified site: Secondary | ICD-10-CM | POA: Diagnosis not present

## 2021-07-13 DIAGNOSIS — M419 Scoliosis, unspecified: Secondary | ICD-10-CM | POA: Diagnosis not present

## 2021-07-13 DIAGNOSIS — E1159 Type 2 diabetes mellitus with other circulatory complications: Secondary | ICD-10-CM | POA: Diagnosis not present

## 2021-07-13 DIAGNOSIS — M4316 Spondylolisthesis, lumbar region: Secondary | ICD-10-CM | POA: Diagnosis not present

## 2021-07-13 DIAGNOSIS — I152 Hypertension secondary to endocrine disorders: Secondary | ICD-10-CM | POA: Diagnosis not present

## 2021-07-13 DIAGNOSIS — M48061 Spinal stenosis, lumbar region without neurogenic claudication: Secondary | ICD-10-CM | POA: Diagnosis not present

## 2021-07-13 DIAGNOSIS — Z4789 Encounter for other orthopedic aftercare: Secondary | ICD-10-CM | POA: Diagnosis not present

## 2021-07-13 DIAGNOSIS — M9973 Connective tissue and disc stenosis of intervertebral foramina of lumbar region: Secondary | ICD-10-CM | POA: Diagnosis not present

## 2021-07-13 DIAGNOSIS — M797 Fibromyalgia: Secondary | ICD-10-CM | POA: Diagnosis not present

## 2021-07-13 DIAGNOSIS — G894 Chronic pain syndrome: Secondary | ICD-10-CM | POA: Diagnosis not present

## 2021-07-16 DIAGNOSIS — Z6841 Body Mass Index (BMI) 40.0 and over, adult: Secondary | ICD-10-CM | POA: Diagnosis not present

## 2021-07-16 DIAGNOSIS — M4316 Spondylolisthesis, lumbar region: Secondary | ICD-10-CM | POA: Diagnosis not present

## 2021-07-16 DIAGNOSIS — I10 Essential (primary) hypertension: Secondary | ICD-10-CM | POA: Diagnosis not present

## 2021-07-17 DIAGNOSIS — G894 Chronic pain syndrome: Secondary | ICD-10-CM | POA: Diagnosis not present

## 2021-07-17 DIAGNOSIS — M48061 Spinal stenosis, lumbar region without neurogenic claudication: Secondary | ICD-10-CM | POA: Diagnosis not present

## 2021-07-17 DIAGNOSIS — M797 Fibromyalgia: Secondary | ICD-10-CM | POA: Diagnosis not present

## 2021-07-17 DIAGNOSIS — Z4789 Encounter for other orthopedic aftercare: Secondary | ICD-10-CM | POA: Diagnosis not present

## 2021-07-17 DIAGNOSIS — M4316 Spondylolisthesis, lumbar region: Secondary | ICD-10-CM | POA: Diagnosis not present

## 2021-07-17 DIAGNOSIS — M1990 Unspecified osteoarthritis, unspecified site: Secondary | ICD-10-CM | POA: Diagnosis not present

## 2021-07-17 DIAGNOSIS — E1159 Type 2 diabetes mellitus with other circulatory complications: Secondary | ICD-10-CM | POA: Diagnosis not present

## 2021-07-17 DIAGNOSIS — M9973 Connective tissue and disc stenosis of intervertebral foramina of lumbar region: Secondary | ICD-10-CM | POA: Diagnosis not present

## 2021-07-17 DIAGNOSIS — I152 Hypertension secondary to endocrine disorders: Secondary | ICD-10-CM | POA: Diagnosis not present

## 2021-07-17 DIAGNOSIS — M419 Scoliosis, unspecified: Secondary | ICD-10-CM | POA: Diagnosis not present

## 2021-07-18 DIAGNOSIS — M48061 Spinal stenosis, lumbar region without neurogenic claudication: Secondary | ICD-10-CM | POA: Diagnosis not present

## 2021-07-18 DIAGNOSIS — M419 Scoliosis, unspecified: Secondary | ICD-10-CM | POA: Diagnosis not present

## 2021-07-18 DIAGNOSIS — Z4789 Encounter for other orthopedic aftercare: Secondary | ICD-10-CM | POA: Diagnosis not present

## 2021-07-18 DIAGNOSIS — M797 Fibromyalgia: Secondary | ICD-10-CM | POA: Diagnosis not present

## 2021-07-18 DIAGNOSIS — G894 Chronic pain syndrome: Secondary | ICD-10-CM | POA: Diagnosis not present

## 2021-07-18 DIAGNOSIS — E1159 Type 2 diabetes mellitus with other circulatory complications: Secondary | ICD-10-CM | POA: Diagnosis not present

## 2021-07-18 DIAGNOSIS — M9973 Connective tissue and disc stenosis of intervertebral foramina of lumbar region: Secondary | ICD-10-CM | POA: Diagnosis not present

## 2021-07-18 DIAGNOSIS — I152 Hypertension secondary to endocrine disorders: Secondary | ICD-10-CM | POA: Diagnosis not present

## 2021-07-18 DIAGNOSIS — M4316 Spondylolisthesis, lumbar region: Secondary | ICD-10-CM | POA: Diagnosis not present

## 2021-07-18 DIAGNOSIS — M1990 Unspecified osteoarthritis, unspecified site: Secondary | ICD-10-CM | POA: Diagnosis not present

## 2021-07-19 DIAGNOSIS — M4316 Spondylolisthesis, lumbar region: Secondary | ICD-10-CM | POA: Diagnosis not present

## 2021-07-19 DIAGNOSIS — M797 Fibromyalgia: Secondary | ICD-10-CM | POA: Diagnosis not present

## 2021-07-19 DIAGNOSIS — M48061 Spinal stenosis, lumbar region without neurogenic claudication: Secondary | ICD-10-CM | POA: Diagnosis not present

## 2021-07-19 DIAGNOSIS — M419 Scoliosis, unspecified: Secondary | ICD-10-CM | POA: Diagnosis not present

## 2021-07-19 DIAGNOSIS — M9973 Connective tissue and disc stenosis of intervertebral foramina of lumbar region: Secondary | ICD-10-CM | POA: Diagnosis not present

## 2021-07-19 DIAGNOSIS — I152 Hypertension secondary to endocrine disorders: Secondary | ICD-10-CM | POA: Diagnosis not present

## 2021-07-19 DIAGNOSIS — M1990 Unspecified osteoarthritis, unspecified site: Secondary | ICD-10-CM | POA: Diagnosis not present

## 2021-07-19 DIAGNOSIS — E1159 Type 2 diabetes mellitus with other circulatory complications: Secondary | ICD-10-CM | POA: Diagnosis not present

## 2021-07-19 DIAGNOSIS — G894 Chronic pain syndrome: Secondary | ICD-10-CM | POA: Diagnosis not present

## 2021-07-19 DIAGNOSIS — Z4789 Encounter for other orthopedic aftercare: Secondary | ICD-10-CM | POA: Diagnosis not present

## 2021-07-23 DIAGNOSIS — Z4789 Encounter for other orthopedic aftercare: Secondary | ICD-10-CM | POA: Diagnosis not present

## 2021-07-23 DIAGNOSIS — M4316 Spondylolisthesis, lumbar region: Secondary | ICD-10-CM | POA: Diagnosis not present

## 2021-07-23 DIAGNOSIS — M1990 Unspecified osteoarthritis, unspecified site: Secondary | ICD-10-CM | POA: Diagnosis not present

## 2021-07-23 DIAGNOSIS — M48061 Spinal stenosis, lumbar region without neurogenic claudication: Secondary | ICD-10-CM | POA: Diagnosis not present

## 2021-07-23 DIAGNOSIS — G894 Chronic pain syndrome: Secondary | ICD-10-CM | POA: Diagnosis not present

## 2021-07-23 DIAGNOSIS — M9973 Connective tissue and disc stenosis of intervertebral foramina of lumbar region: Secondary | ICD-10-CM | POA: Diagnosis not present

## 2021-07-23 DIAGNOSIS — I152 Hypertension secondary to endocrine disorders: Secondary | ICD-10-CM | POA: Diagnosis not present

## 2021-07-23 DIAGNOSIS — M797 Fibromyalgia: Secondary | ICD-10-CM | POA: Diagnosis not present

## 2021-07-23 DIAGNOSIS — M419 Scoliosis, unspecified: Secondary | ICD-10-CM | POA: Diagnosis not present

## 2021-07-23 DIAGNOSIS — E1159 Type 2 diabetes mellitus with other circulatory complications: Secondary | ICD-10-CM | POA: Diagnosis not present

## 2021-07-24 DIAGNOSIS — M797 Fibromyalgia: Secondary | ICD-10-CM | POA: Diagnosis not present

## 2021-07-24 DIAGNOSIS — E1159 Type 2 diabetes mellitus with other circulatory complications: Secondary | ICD-10-CM | POA: Diagnosis not present

## 2021-07-24 DIAGNOSIS — G894 Chronic pain syndrome: Secondary | ICD-10-CM | POA: Diagnosis not present

## 2021-07-24 DIAGNOSIS — M4316 Spondylolisthesis, lumbar region: Secondary | ICD-10-CM | POA: Diagnosis not present

## 2021-07-24 DIAGNOSIS — Z4789 Encounter for other orthopedic aftercare: Secondary | ICD-10-CM | POA: Diagnosis not present

## 2021-07-24 DIAGNOSIS — M9973 Connective tissue and disc stenosis of intervertebral foramina of lumbar region: Secondary | ICD-10-CM | POA: Diagnosis not present

## 2021-07-24 DIAGNOSIS — M419 Scoliosis, unspecified: Secondary | ICD-10-CM | POA: Diagnosis not present

## 2021-07-24 DIAGNOSIS — M48061 Spinal stenosis, lumbar region without neurogenic claudication: Secondary | ICD-10-CM | POA: Diagnosis not present

## 2021-07-24 DIAGNOSIS — I152 Hypertension secondary to endocrine disorders: Secondary | ICD-10-CM | POA: Diagnosis not present

## 2021-07-24 DIAGNOSIS — M1990 Unspecified osteoarthritis, unspecified site: Secondary | ICD-10-CM | POA: Diagnosis not present

## 2021-07-26 DIAGNOSIS — E1159 Type 2 diabetes mellitus with other circulatory complications: Secondary | ICD-10-CM | POA: Diagnosis not present

## 2021-07-26 DIAGNOSIS — M9973 Connective tissue and disc stenosis of intervertebral foramina of lumbar region: Secondary | ICD-10-CM | POA: Diagnosis not present

## 2021-07-26 DIAGNOSIS — M419 Scoliosis, unspecified: Secondary | ICD-10-CM | POA: Diagnosis not present

## 2021-07-26 DIAGNOSIS — I152 Hypertension secondary to endocrine disorders: Secondary | ICD-10-CM | POA: Diagnosis not present

## 2021-07-26 DIAGNOSIS — Z4789 Encounter for other orthopedic aftercare: Secondary | ICD-10-CM | POA: Diagnosis not present

## 2021-07-26 DIAGNOSIS — M4316 Spondylolisthesis, lumbar region: Secondary | ICD-10-CM | POA: Diagnosis not present

## 2021-07-26 DIAGNOSIS — M1990 Unspecified osteoarthritis, unspecified site: Secondary | ICD-10-CM | POA: Diagnosis not present

## 2021-07-26 DIAGNOSIS — G894 Chronic pain syndrome: Secondary | ICD-10-CM | POA: Diagnosis not present

## 2021-07-26 DIAGNOSIS — M797 Fibromyalgia: Secondary | ICD-10-CM | POA: Diagnosis not present

## 2021-07-26 DIAGNOSIS — M48061 Spinal stenosis, lumbar region without neurogenic claudication: Secondary | ICD-10-CM | POA: Diagnosis not present

## 2021-07-29 DIAGNOSIS — E1159 Type 2 diabetes mellitus with other circulatory complications: Secondary | ICD-10-CM | POA: Diagnosis not present

## 2021-07-29 DIAGNOSIS — M9973 Connective tissue and disc stenosis of intervertebral foramina of lumbar region: Secondary | ICD-10-CM | POA: Diagnosis not present

## 2021-07-29 DIAGNOSIS — M48061 Spinal stenosis, lumbar region without neurogenic claudication: Secondary | ICD-10-CM | POA: Diagnosis not present

## 2021-07-29 DIAGNOSIS — M797 Fibromyalgia: Secondary | ICD-10-CM | POA: Diagnosis not present

## 2021-07-29 DIAGNOSIS — I152 Hypertension secondary to endocrine disorders: Secondary | ICD-10-CM | POA: Diagnosis not present

## 2021-07-29 DIAGNOSIS — M419 Scoliosis, unspecified: Secondary | ICD-10-CM | POA: Diagnosis not present

## 2021-07-29 DIAGNOSIS — M4316 Spondylolisthesis, lumbar region: Secondary | ICD-10-CM | POA: Diagnosis not present

## 2021-07-29 DIAGNOSIS — G894 Chronic pain syndrome: Secondary | ICD-10-CM | POA: Diagnosis not present

## 2021-07-29 DIAGNOSIS — M1990 Unspecified osteoarthritis, unspecified site: Secondary | ICD-10-CM | POA: Diagnosis not present

## 2021-07-29 DIAGNOSIS — Z4789 Encounter for other orthopedic aftercare: Secondary | ICD-10-CM | POA: Diagnosis not present

## 2021-07-31 DIAGNOSIS — M4316 Spondylolisthesis, lumbar region: Secondary | ICD-10-CM | POA: Diagnosis not present

## 2021-07-31 DIAGNOSIS — M48061 Spinal stenosis, lumbar region without neurogenic claudication: Secondary | ICD-10-CM | POA: Diagnosis not present

## 2021-07-31 DIAGNOSIS — E1159 Type 2 diabetes mellitus with other circulatory complications: Secondary | ICD-10-CM | POA: Diagnosis not present

## 2021-07-31 DIAGNOSIS — M419 Scoliosis, unspecified: Secondary | ICD-10-CM | POA: Diagnosis not present

## 2021-07-31 DIAGNOSIS — G894 Chronic pain syndrome: Secondary | ICD-10-CM | POA: Diagnosis not present

## 2021-07-31 DIAGNOSIS — I152 Hypertension secondary to endocrine disorders: Secondary | ICD-10-CM | POA: Diagnosis not present

## 2021-07-31 DIAGNOSIS — M9973 Connective tissue and disc stenosis of intervertebral foramina of lumbar region: Secondary | ICD-10-CM | POA: Diagnosis not present

## 2021-07-31 DIAGNOSIS — Z4789 Encounter for other orthopedic aftercare: Secondary | ICD-10-CM | POA: Diagnosis not present

## 2021-07-31 DIAGNOSIS — M797 Fibromyalgia: Secondary | ICD-10-CM | POA: Diagnosis not present

## 2021-07-31 DIAGNOSIS — M1990 Unspecified osteoarthritis, unspecified site: Secondary | ICD-10-CM | POA: Diagnosis not present

## 2021-08-05 DIAGNOSIS — M1990 Unspecified osteoarthritis, unspecified site: Secondary | ICD-10-CM | POA: Diagnosis not present

## 2021-08-05 DIAGNOSIS — I152 Hypertension secondary to endocrine disorders: Secondary | ICD-10-CM | POA: Diagnosis not present

## 2021-08-05 DIAGNOSIS — M48061 Spinal stenosis, lumbar region without neurogenic claudication: Secondary | ICD-10-CM | POA: Diagnosis not present

## 2021-08-05 DIAGNOSIS — G894 Chronic pain syndrome: Secondary | ICD-10-CM | POA: Diagnosis not present

## 2021-08-05 DIAGNOSIS — E1159 Type 2 diabetes mellitus with other circulatory complications: Secondary | ICD-10-CM | POA: Diagnosis not present

## 2021-08-05 DIAGNOSIS — Z4789 Encounter for other orthopedic aftercare: Secondary | ICD-10-CM | POA: Diagnosis not present

## 2021-08-05 DIAGNOSIS — M419 Scoliosis, unspecified: Secondary | ICD-10-CM | POA: Diagnosis not present

## 2021-08-05 DIAGNOSIS — M797 Fibromyalgia: Secondary | ICD-10-CM | POA: Diagnosis not present

## 2021-08-05 DIAGNOSIS — M9973 Connective tissue and disc stenosis of intervertebral foramina of lumbar region: Secondary | ICD-10-CM | POA: Diagnosis not present

## 2021-08-05 DIAGNOSIS — M4316 Spondylolisthesis, lumbar region: Secondary | ICD-10-CM | POA: Diagnosis not present

## 2021-08-12 DIAGNOSIS — I152 Hypertension secondary to endocrine disorders: Secondary | ICD-10-CM | POA: Diagnosis not present

## 2021-08-12 DIAGNOSIS — Z4789 Encounter for other orthopedic aftercare: Secondary | ICD-10-CM | POA: Diagnosis not present

## 2021-08-12 DIAGNOSIS — M419 Scoliosis, unspecified: Secondary | ICD-10-CM | POA: Diagnosis not present

## 2021-08-12 DIAGNOSIS — G894 Chronic pain syndrome: Secondary | ICD-10-CM | POA: Diagnosis not present

## 2021-08-12 DIAGNOSIS — E1159 Type 2 diabetes mellitus with other circulatory complications: Secondary | ICD-10-CM | POA: Diagnosis not present

## 2021-08-12 DIAGNOSIS — M9973 Connective tissue and disc stenosis of intervertebral foramina of lumbar region: Secondary | ICD-10-CM | POA: Diagnosis not present

## 2021-08-12 DIAGNOSIS — M797 Fibromyalgia: Secondary | ICD-10-CM | POA: Diagnosis not present

## 2021-08-12 DIAGNOSIS — M4316 Spondylolisthesis, lumbar region: Secondary | ICD-10-CM | POA: Diagnosis not present

## 2021-08-12 DIAGNOSIS — M48061 Spinal stenosis, lumbar region without neurogenic claudication: Secondary | ICD-10-CM | POA: Diagnosis not present

## 2021-08-12 DIAGNOSIS — M1990 Unspecified osteoarthritis, unspecified site: Secondary | ICD-10-CM | POA: Diagnosis not present

## 2021-08-18 DIAGNOSIS — M797 Fibromyalgia: Secondary | ICD-10-CM | POA: Diagnosis not present

## 2021-08-18 DIAGNOSIS — E1159 Type 2 diabetes mellitus with other circulatory complications: Secondary | ICD-10-CM | POA: Diagnosis not present

## 2021-08-18 DIAGNOSIS — M419 Scoliosis, unspecified: Secondary | ICD-10-CM | POA: Diagnosis not present

## 2021-08-18 DIAGNOSIS — Z4789 Encounter for other orthopedic aftercare: Secondary | ICD-10-CM | POA: Diagnosis not present

## 2021-08-18 DIAGNOSIS — M1990 Unspecified osteoarthritis, unspecified site: Secondary | ICD-10-CM | POA: Diagnosis not present

## 2021-08-18 DIAGNOSIS — I152 Hypertension secondary to endocrine disorders: Secondary | ICD-10-CM | POA: Diagnosis not present

## 2021-08-18 DIAGNOSIS — M9973 Connective tissue and disc stenosis of intervertebral foramina of lumbar region: Secondary | ICD-10-CM | POA: Diagnosis not present

## 2021-08-18 DIAGNOSIS — M4316 Spondylolisthesis, lumbar region: Secondary | ICD-10-CM | POA: Diagnosis not present

## 2021-08-18 DIAGNOSIS — G894 Chronic pain syndrome: Secondary | ICD-10-CM | POA: Diagnosis not present

## 2021-08-18 DIAGNOSIS — M48061 Spinal stenosis, lumbar region without neurogenic claudication: Secondary | ICD-10-CM | POA: Diagnosis not present

## 2021-08-30 DIAGNOSIS — M1990 Unspecified osteoarthritis, unspecified site: Secondary | ICD-10-CM | POA: Diagnosis not present

## 2021-08-30 DIAGNOSIS — I152 Hypertension secondary to endocrine disorders: Secondary | ICD-10-CM | POA: Diagnosis not present

## 2021-08-30 DIAGNOSIS — M9973 Connective tissue and disc stenosis of intervertebral foramina of lumbar region: Secondary | ICD-10-CM | POA: Diagnosis not present

## 2021-08-30 DIAGNOSIS — M48061 Spinal stenosis, lumbar region without neurogenic claudication: Secondary | ICD-10-CM | POA: Diagnosis not present

## 2021-08-30 DIAGNOSIS — Z4789 Encounter for other orthopedic aftercare: Secondary | ICD-10-CM | POA: Diagnosis not present

## 2021-08-30 DIAGNOSIS — E1159 Type 2 diabetes mellitus with other circulatory complications: Secondary | ICD-10-CM | POA: Diagnosis not present

## 2021-08-30 DIAGNOSIS — M797 Fibromyalgia: Secondary | ICD-10-CM | POA: Diagnosis not present

## 2021-08-30 DIAGNOSIS — G894 Chronic pain syndrome: Secondary | ICD-10-CM | POA: Diagnosis not present

## 2021-08-30 DIAGNOSIS — M419 Scoliosis, unspecified: Secondary | ICD-10-CM | POA: Diagnosis not present

## 2021-08-30 DIAGNOSIS — M4316 Spondylolisthesis, lumbar region: Secondary | ICD-10-CM | POA: Diagnosis not present

## 2021-10-16 DIAGNOSIS — Z0000 Encounter for general adult medical examination without abnormal findings: Secondary | ICD-10-CM | POA: Diagnosis not present

## 2021-10-16 DIAGNOSIS — M797 Fibromyalgia: Secondary | ICD-10-CM | POA: Diagnosis not present

## 2021-10-16 DIAGNOSIS — G2581 Restless legs syndrome: Secondary | ICD-10-CM | POA: Diagnosis not present

## 2021-10-16 DIAGNOSIS — Z6841 Body Mass Index (BMI) 40.0 and over, adult: Secondary | ICD-10-CM | POA: Diagnosis not present

## 2021-10-16 DIAGNOSIS — Z23 Encounter for immunization: Secondary | ICD-10-CM | POA: Diagnosis not present

## 2021-10-16 DIAGNOSIS — R69 Illness, unspecified: Secondary | ICD-10-CM | POA: Diagnosis not present

## 2021-10-16 DIAGNOSIS — Z1211 Encounter for screening for malignant neoplasm of colon: Secondary | ICD-10-CM | POA: Diagnosis not present

## 2021-10-16 DIAGNOSIS — E1169 Type 2 diabetes mellitus with other specified complication: Secondary | ICD-10-CM | POA: Diagnosis not present

## 2021-10-16 DIAGNOSIS — I10 Essential (primary) hypertension: Secondary | ICD-10-CM | POA: Diagnosis not present

## 2021-10-18 DIAGNOSIS — I10 Essential (primary) hypertension: Secondary | ICD-10-CM | POA: Diagnosis not present

## 2021-10-18 DIAGNOSIS — E1169 Type 2 diabetes mellitus with other specified complication: Secondary | ICD-10-CM | POA: Diagnosis not present

## 2021-10-18 DIAGNOSIS — Z1389 Encounter for screening for other disorder: Secondary | ICD-10-CM | POA: Diagnosis not present

## 2021-10-18 DIAGNOSIS — Z0000 Encounter for general adult medical examination without abnormal findings: Secondary | ICD-10-CM | POA: Diagnosis not present

## 2021-11-06 DIAGNOSIS — H524 Presbyopia: Secondary | ICD-10-CM | POA: Diagnosis not present

## 2021-11-06 DIAGNOSIS — H52209 Unspecified astigmatism, unspecified eye: Secondary | ICD-10-CM | POA: Diagnosis not present

## 2021-11-06 DIAGNOSIS — H5213 Myopia, bilateral: Secondary | ICD-10-CM | POA: Diagnosis not present

## 2021-11-17 DIAGNOSIS — N3001 Acute cystitis with hematuria: Secondary | ICD-10-CM | POA: Diagnosis not present

## 2021-12-10 ENCOUNTER — Other Ambulatory Visit: Payer: Self-pay | Admitting: *Deleted

## 2021-12-10 DIAGNOSIS — Z122 Encounter for screening for malignant neoplasm of respiratory organs: Secondary | ICD-10-CM

## 2021-12-10 DIAGNOSIS — Z87891 Personal history of nicotine dependence: Secondary | ICD-10-CM

## 2021-12-24 ENCOUNTER — Encounter: Payer: Self-pay | Admitting: Acute Care

## 2021-12-24 ENCOUNTER — Ambulatory Visit (INDEPENDENT_AMBULATORY_CARE_PROVIDER_SITE_OTHER): Payer: Medicare HMO | Admitting: Acute Care

## 2021-12-24 DIAGNOSIS — F17210 Nicotine dependence, cigarettes, uncomplicated: Secondary | ICD-10-CM | POA: Diagnosis not present

## 2021-12-24 DIAGNOSIS — R69 Illness, unspecified: Secondary | ICD-10-CM | POA: Diagnosis not present

## 2021-12-24 NOTE — Progress Notes (Signed)
Virtual Visit via Telephone Note ? ?I connected with Louann Sjogren on 12/24/21 at 12:00 PM EDT by telephone and verified that I am speaking with the correct person using two identifiers. ? ?Location: ?Patient: at home ?Provider:  [place], [place], [place], [address]  ?  ?I discussed the limitations, risks, security and privacy concerns of performing an evaluation and management service by telephone and the availability of in person appointments. I also discussed with the patient that there may be a patient responsible charge related to this service. The patient expressed understanding and agreed to proceed. ? ? ?Shared Decision Making Visit Lung Cancer Screening Program ?([id number]) ? ? ?Eligibility: ?Age 66 y.o. ?Pack Years Smoking History Calculation 42 pack year smoking history ?(# packs/per year x # years smoked) ?Recent History of coughing up blood  no ?Unexplained weight loss? no ?( >Than 15 pounds within the last 6 months ) ?Prior History Lung / other cancer no ?(Diagnosis within the last 5 years already requiring surveillance chest CT Scans). ?Smoking Status Current Smoker ?Former Smokers: Years since quit:  NA ? Quit Date:  NA ? ?Visit Components: ?Discussion included one or more decision making aids. yes ?Discussion included risk/benefits of screening. yes ?Discussion included potential follow up diagnostic testing for abnormal scans. yes ?Discussion included meaning and risk of over diagnosis. yes ?Discussion included meaning and risk of False Positives. yes ?Discussion included meaning of total radiation exposure. yes ? ?Counseling Included: ?Importance of adherence to annual lung cancer LDCT screening. yes ?Impact of comorbidities on ability to participate in the program. yes ?Ability and willingness to under diagnostic treatment. yes ? ?Smoking Cessation Counseling: ?Current Smokers:  ?Discussed importance of smoking cessation. yes ?Information about tobacco cessation classes and  interventions provided to patient. yes ?Patient provided with "ticket" for LDCT Scan. yes ?Symptomatic Patient. no ? Counseling NA ?Diagnosis Code: Tobacco Use Z72.0 ?Asymptomatic Patient yes ? Counseling (Intermediate counseling: > three minutes counseling) A2130 ?Former Smokers:  ?Discussed the importance of maintaining cigarette abstinence. yes ?Diagnosis Code: Personal History of Nicotine Dependence. Q65.784 ?Information about tobacco cessation classes and interventions provided to patient. Yes ?Patient provided with "ticket" for LDCT Scan. yes ?Written Order for Lung Cancer Screening with LDCT placed in Epic. Yes ?(CT Chest Lung Cancer Screening Low Dose W/O CM) [id number] ?Z12.2-Screening of respiratory organs ?Z87.891-Personal history of nicotine dependence ? ?I have spent 25 minutes of face to face/ virtual visit   time with  Ms. Brockman  discussing the risks and benefits of lung cancer screening. We viewed / discussed a power point together that explained in detail the above noted topics. We paused at intervals to allow for questions to be asked and answered to ensure understanding.We discussed that the single most powerful action that  can take to decrease she risk of developing lung cancer is to quit smoking. We discussed whether or not she is ready to commit to setting a quit date. We discussed options for tools to aid in quitting smoking including nicotine replacement therapy, non-nicotine medications, support groups, Quit Smart classes, and behavior modification. We discussed that often times setting smaller, more achievable goals, such as eliminating 1 cigarette a day for a week and then 2 cigarettes a day for a week can be helpful in slowly decreasing the number of cigarettes smoked. This allows for a sense of accomplishment as well as providing a clinical benefit. I provided  her  with smoking cessation  information  with contact information for community resources, classes,  free nicotine replacement  therapy, and access to mobile apps, text messaging, and on-line smoking cessation help. I have also provided  her  the office contact information in the event she needs to contact me, or the screening staff. We discussed the time and location of the scan, and that either Doroteo Glassman RN, Joella Prince, RN  or I will call / send a letter with the results within 24-72 hours of receiving them. The patient verbalized understanding of all of  the above and had no further questions upon leaving the office. They have my contact information in the event they have any further questions. ? ?I spent 3 minutes counseling on smoking cessation and the health risks of continued tobacco abuse. ? ?I explained to the patient that there has been a high incidence of coronary artery disease noted on these exams. I explained that this is a non-gated exam therefore degree or severity cannot be determined. This patient is  not on statin therapy. I have asked the patient to follow-up with their PCP regarding any incidental finding of coronary artery disease and management with diet or medication as their PCP  feels is clinically indicated. The patient verbalized understanding of the above and had no further questions upon completion of the visit. ? ?  ? ? ?Magdalen Spatz, NP ?12/24/2021 ? ? ? ? ? ? ?

## 2021-12-24 NOTE — Patient Instructions (Signed)
Thank you for participating in the [facility]. It was our pleasure to meet you today. We will call you with the results of your scan within the next few days. Your scan will be assigned a Lung RADS category score by the physicians reading the scans.  This Lung RADS score determines follow up scanning.  See below for description of categories, and follow up screening recommendations. We will be in touch to schedule your follow up screening annually or based on recommendations of our providers. We will fax a copy of your scan results to your Primary Care Physician, or the physician who referred you to the program, to ensure they have the results. Please call the office if you have any questions or concerns regarding your scanning experience or results.  Our office number is [phone]. Please speak with Denise Phelps, RN. , or  Denise Buckner RN, They are  our Lung Cancer Screening RN.'s If They are unavailable when you call, Please leave a message on the voice mail. We will return your call at our earliest convenience.This voice mail is monitored several times a day.  Remember, if your scan is normal, we will scan you annually as long as you continue to meet the criteria for the program. (Age 55-77, Current smoker or smoker who has quit within the last 15 years). If you are a smoker, remember, quitting is the single most powerful action that you can take to decrease your risk of lung cancer and other pulmonary, breathing related problems. We know quitting is hard, and we are here to help.  Please let us know if there is anything we can do to help you meet your goal of quitting. If you are a former smoker, congratulations. We are proud of you! Remain smoke free! Remember you can refer friends or family members through the number above.  We will screen them to make sure they meet criteria for the program. Thank you for helping us take better care of you by  participating in Lung Screening.  You can receive free nicotine replacement therapy ( patches, gum or mints) by calling 1-800-QUIT NOW. Please call so we can get you on the path to becoming  a non-smoker. I know it is hard, but you can do this!  Lung RADS Categories:  Lung RADS 1: no nodules or definitely non-concerning nodules.  Recommendation is for a repeat annual scan in 12 months.  Lung RADS 2:  nodules that are non-concerning in appearance and behavior with a very low likelihood of becoming an active cancer. Recommendation is for a repeat annual scan in 12 months.  Lung RADS 3: nodules that are probably non-concerning , includes nodules with a low likelihood of becoming an active cancer.  Recommendation is for a 6-month repeat screening scan. Often noted after an upper respiratory illness. We will be in touch to make sure you have no questions, and to schedule your 6-month scan.  Lung RADS 4 A: nodules with concerning findings, recommendation is most often for a follow up scan in 3 months or additional testing based on our provider's assessment of the scan. We will be in touch to make sure you have no questions and to schedule the recommended 3 month follow up scan.  Lung RADS 4 B:  indicates findings that are concerning. We will be in touch with you to schedule additional diagnostic testing based on our provider's  assessment of the scan.  Other options for assistance in smoking cessation (   As covered by your insurance benefits)  Hypnosis for smoking cessation  [employer] [phone]  Acupuncture for smoking cessation  [facility] [phone]   

## 2021-12-26 ENCOUNTER — Ambulatory Visit
Admission: RE | Admit: 2021-12-26 | Discharge: 2021-12-26 | Disposition: A | Discharge status: Home / Self Care | Payer: Medicare HMO | Source: Ambulatory Visit | Attending: Acute Care | Admitting: Acute Care

## 2021-12-26 DIAGNOSIS — Z122 Encounter for screening for malignant neoplasm of respiratory organs: Secondary | ICD-10-CM

## 2021-12-26 DIAGNOSIS — R69 Illness, unspecified: Secondary | ICD-10-CM | POA: Diagnosis not present

## 2021-12-26 DIAGNOSIS — J432 Centrilobular emphysema: Secondary | ICD-10-CM | POA: Diagnosis not present

## 2021-12-26 DIAGNOSIS — Z87891 Personal history of nicotine dependence: Secondary | ICD-10-CM

## 2021-12-26 DIAGNOSIS — I700 Atherosclerosis of aorta: Secondary | ICD-10-CM | POA: Diagnosis not present

## 2021-12-26 DIAGNOSIS — I2510 Atherosclerotic heart disease of native coronary artery without angina pectoris: Secondary | ICD-10-CM | POA: Diagnosis not present

## 2021-12-26 DIAGNOSIS — F17210 Nicotine dependence, cigarettes, uncomplicated: Secondary | ICD-10-CM | POA: Diagnosis not present

## 2021-12-27 ENCOUNTER — Other Ambulatory Visit: Payer: Self-pay

## 2021-12-27 DIAGNOSIS — Z122 Encounter for screening for malignant neoplasm of respiratory organs: Secondary | ICD-10-CM

## 2021-12-27 DIAGNOSIS — F17210 Nicotine dependence, cigarettes, uncomplicated: Secondary | ICD-10-CM

## 2021-12-27 DIAGNOSIS — Z87891 Personal history of nicotine dependence: Secondary | ICD-10-CM

## 2022-01-09 ENCOUNTER — Other Ambulatory Visit: Payer: Self-pay | Admitting: Neurosurgery

## 2022-01-09 ENCOUNTER — Other Ambulatory Visit (HOSPITAL_COMMUNITY): Payer: Self-pay | Admitting: Neurosurgery

## 2022-01-09 ENCOUNTER — Ambulatory Visit (HOSPITAL_COMMUNITY): Payer: Medicaid Other

## 2022-01-09 DIAGNOSIS — M4316 Spondylolisthesis, lumbar region: Secondary | ICD-10-CM

## 2022-01-17 ENCOUNTER — Ambulatory Visit (HOSPITAL_COMMUNITY)
Admission: RE | Admit: 2022-01-17 | Discharge: 2022-01-17 | Disposition: A | Discharge status: Home / Self Care | Payer: Medicare Other | Source: Ambulatory Visit | Attending: Neurosurgery | Admitting: Neurosurgery

## 2022-01-17 ENCOUNTER — Other Ambulatory Visit: Payer: Self-pay

## 2022-01-17 ENCOUNTER — Ambulatory Visit (HOSPITAL_COMMUNITY): Payer: Medicare Other

## 2022-01-17 DIAGNOSIS — Z981 Arthrodesis status: Secondary | ICD-10-CM | POA: Insufficient documentation

## 2022-01-17 DIAGNOSIS — M47814 Spondylosis without myelopathy or radiculopathy, thoracic region: Secondary | ICD-10-CM | POA: Insufficient documentation

## 2022-01-17 DIAGNOSIS — M5136 Other intervertebral disc degeneration, lumbar region: Secondary | ICD-10-CM | POA: Diagnosis not present

## 2022-01-17 DIAGNOSIS — M4316 Spondylolisthesis, lumbar region: Secondary | ICD-10-CM | POA: Diagnosis present

## 2022-01-17 DIAGNOSIS — M48061 Spinal stenosis, lumbar region without neurogenic claudication: Secondary | ICD-10-CM | POA: Diagnosis not present

## 2022-01-17 DIAGNOSIS — M47816 Spondylosis without myelopathy or radiculopathy, lumbar region: Secondary | ICD-10-CM | POA: Insufficient documentation

## 2022-01-17 DIAGNOSIS — I700 Atherosclerosis of aorta: Secondary | ICD-10-CM | POA: Diagnosis not present

## 2022-01-17 LAB — GLUCOSE, CAPILLARY
Glucose-Capillary: 167 mg/dL — ABNORMAL HIGH (ref 70–99)
Glucose-Capillary: 144 mg/dL — ABNORMAL HIGH (ref 70–99)

## 2022-01-17 MED ORDER — LIDOCAINE HCL (PF) 1 % IJ SOLN
5.0000 mL | Freq: Once | INTRAMUSCULAR | Status: DC
Start: 2022-01-17 — End: 2022-01-18

## 2022-01-17 MED ORDER — ONDANSETRON HCL 4 MG/2ML IJ SOLN: 4.0000 mg | Freq: Four times a day (QID) | INTRAMUSCULAR | Status: DC | PRN

## 2022-01-17 MED ORDER — OXYCODONE-ACETAMINOPHEN 5-325 MG PO TABS
ORAL_TABLET | ORAL | Status: AC
  Filled 2022-01-17: qty 1

## 2022-01-17 MED ORDER — OXYCODONE HCL 5 MG PO TABS
5.0000 mg | ORAL_TABLET | ORAL | Status: DC | PRN
  Administered 2022-01-17: 10 mg via ORAL
  Filled 2022-01-17: qty 2

## 2022-01-17 MED ORDER — IOHEXOL 180 MG/ML  SOLN
20.0000 mL | Freq: Once | INTRAMUSCULAR | Status: AC | PRN
  Administered 2022-01-17: 20 mL via INTRATHECAL

## 2022-01-17 MED ORDER — DIAZEPAM 5 MG PO TABS
5.0000 mg | ORAL_TABLET | Freq: Once | ORAL | Status: AC
  Administered 2022-01-17: 5 mg via ORAL
  Filled 2022-01-17: qty 1

## 2022-01-17 MED ORDER — DIAZEPAM 5 MG PO TABS: 5.0000 mg | ORAL_TABLET | Freq: Once | ORAL | Status: DC

## 2022-01-17 NOTE — Discharge Instructions (Signed)
Myelogram and Lumbar Puncture Discharge Instructions  Go home and rest quietly for the next 24 hours.  It is important to lie flat for the next 24 hours.  Get up only to go to the restroom.  You may lie in the bed or on a couch on your back, your stomach, your left side or your right side.  You may have one pillow under your head.  You may have pillows between your knees while you are on your side or under your knees while you are on your back.  DO NOT drive today.  Recline the seat as far back as it will go, while still wearing your seat belt, on the way home.  You may get up to go to the bathroom as needed.  You may sit up for 10 minutes to eat.  You may resume your normal diet and medications unless otherwise indicated.  The incidence of headache, nausea, or vomiting is about 5% (one in 20 patients).  If you develop a headache, lie flat and drink plenty of fluids until the headache goes away.  Caffeinated beverages may be helpful.  If you develop severe nausea and vomiting or a headache that does not go away with flat bed rest, call Dr.  Dennis Bast may resume normal activities after your 24 hours of bed rest is over; however, do not exert yourself strongly or do any heavy lifting tomorrow.  Call your physician for a follow-up appointment.  The results of your myelogram will be sent directly to your physician by the following day.  If you have any questions or if complications develop after you arrive home, please call Dr  Discharge instructions have been explained to the patient.  The patient, or the person responsible for the patient, fully understands these instructions.

## 2022-01-17 NOTE — Progress Notes (Signed)
No Orders.

## 2022-01-17 NOTE — Op Note (Signed)
01/17/2022 lumbar Myelogram  PATIENT:  [name] is a 66 y.o. female  PRE-OPERATIVE DIAGNOSIS:  lumbago  POST-OPERATIVE DIAGNOSIS:  lumbago  PROCEDURE:  Lumbar Myelogram  SURGEON:  Lovina Zuver  ANESTHESIA:   local LOCAL MEDICATIONS USED:  LIDOCAINE  Procedure Note: Shannon [name] is a 66 y.o. female Was taken to the fluoroscopy suite and  positioned prone on the fluoroscopy table. Her back was prepared and draped in a sterile manner. I infiltrated 9 cc into the lumbar region. I then introduced a spinal needle into the thecal sac at the L4/5 interlaminar space. I infiltrated 20cc of Isovue 180 into the thecal sac. Fluoroscopy showed the needle and contrast in the thecal sac. Shannon [name] tolerated the procedure well. she Will be taken to CT for evaluation.     PATIENT DISPOSITION:  Short Stay

## 2022-01-17 NOTE — Progress Notes (Signed)
Called for and received orders.

## 2022-01-23 ENCOUNTER — Other Ambulatory Visit: Payer: Self-pay | Admitting: Neurosurgery

## 2022-02-19 NOTE — Pre-Procedure Instructions (Signed)
Surgical Instructions    Your procedure is scheduled on February 26, 2022.  Report to [facility] Main Entrance "A" at 6:30 A.M., then check in with the Admitting office.  Call this number if you have problems the morning of surgery:  [phone]   If you have any questions prior to your surgery date call [phone]: Open Monday-Friday 8am-4pm    Remember:  Do not eat after midnight the night before your surgery  You may drink clear liquids until 5:30 AM the morning of your surgery.   Clear liquids allowed are: Water, Non-Citrus Juices (without pulp), Carbonated Beverages, Clear Tea, Black Coffee Only (NO MILK, CREAM OR POWDERED CREAMER of any kind), and Gatorade.    Take these medicines the morning of surgery with A SIP OF WATER:  amLODipine (NORVASC)  DULoxetine (CYMBALTA)  pantoprazole (PROTONIX)    Take these medicines the morning of surgery AS NEEDED:  acetaminophen (TYLENOL)   fluticasone (FLONASE)  meclizine (ANTIVERT)  As of today, STOP taking any Aspirin (unless otherwise instructed by your surgeon) Aleve, Naproxen, Ibuprofen, Motrin, Advil, Goody's, BC's, all herbal medications, fish oil, and all vitamins.   HOW TO MANAGE YOUR DIABETES BEFORE AND AFTER SURGERY  Why is it important to control my blood sugar before and after surgery? Improving blood sugar levels before and after surgery helps healing and can limit problems. A way of improving blood sugar control is eating a healthy diet by:  Eating less sugar and carbohydrates  Increasing activity/exercise  Talking with your doctor about reaching your blood sugar goals High blood sugars (greater than 180 mg/dL) can raise your risk of infections and slow your recovery, so you will need to focus on controlling your diabetes during the weeks before surgery. Make sure that the doctor who takes care of your diabetes knows about your planned surgery including the date and location.  How do I manage my blood sugar before  surgery? Check your blood sugar at least 4 times a day, starting 2 days before surgery, to make sure that the level is not too high or low.  Check your blood sugar the morning of your surgery when you wake up and every 2 hours until you get to the Short Stay unit.  If your blood sugar is less than 70 mg/dL, you will need to treat for low blood sugar: Do not take insulin. Treat a low blood sugar (less than 70 mg/dL) with  cup of clear juice (cranberry or apple), 4 glucose tablets, OR glucose gel. Recheck blood sugar in 15 minutes after treatment (to make sure it is greater than 70 mg/dL). If your blood sugar is not greater than 70 mg/dL on recheck, call [phone] for further instructions. Report your blood sugar to the short stay nurse when you get to Short Stay.  If you are admitted to the hospital after surgery: Your blood sugar will be checked by the staff and you will probably be given insulin after surgery (instead of oral diabetes medicines) to make sure you have good blood sugar levels. The goal for blood sugar control after surgery is 80-180 mg/dL.                      Do NOT Smoke (Tobacco/Vaping) for 24 hours prior to your procedure.  If you use a CPAP at night, you may bring your mask/headgear for your overnight stay.   Contacts, glasses, piercing's, hearing aid's, dentures or partials may not be worn into surgery, please bring  cases for these belongings.    For patients admitted to the hospital, discharge time will be determined by your treatment team.   Patients discharged the day of surgery will not be allowed to drive home, and someone needs to stay with them for 24 hours.  SURGICAL WAITING ROOM VISITATION Patients having surgery or a procedure may have two support people in the waiting room. These visitors may be switched out with other visitors if needed. Children under the age of 82 must have an adult accompany them who is not the patient. If the patient needs to  stay at the hospital during part of their recovery, the visitor guidelines for inpatient rooms apply.  Please refer to the [facility] website for the visitor guidelines for Inpatients (after your surgery is over and you are in a regular room).    Special instructions:   [facility]- Preparing For Surgery  Before surgery, you can play an important role. Because skin is not sterile, your skin needs to be as free of germs as possible. You can reduce the number of germs on your skin by washing with CHG (chlorahexidine gluconate) Soap before surgery.  CHG is an antiseptic cleaner which kills germs and bonds with the skin to continue killing germs even after washing.    Oral Hygiene is also important to reduce your risk of infection.  Remember - BRUSH YOUR TEETH THE MORNING OF SURGERY WITH YOUR REGULAR TOOTHPASTE  Please do not use if you have an allergy to CHG or antibacterial soaps. If your skin becomes reddened/irritated stop using the CHG.  Do not shave (including legs and underarms) for at least 48 hours prior to first CHG shower. It is OK to shave your face.  Please follow these instructions carefully.   Shower the NIGHT BEFORE SURGERY and the MORNING OF SURGERY  If you chose to wash your hair, wash your hair first as usual with your normal shampoo.  After you shampoo, rinse your hair and body thoroughly to remove the shampoo.  Use CHG Soap as you would any other liquid soap. You can apply CHG directly to the skin and wash gently with a scrungie or a clean washcloth.   Apply the CHG Soap to your body ONLY FROM THE NECK DOWN.  Do not use on open wounds or open sores. Avoid contact with your eyes, ears, mouth and genitals (private parts). Wash Face and genitals (private parts)  with your normal soap.   Wash thoroughly, paying special attention to the area where your surgery will be performed.  Thoroughly rinse your body with warm water from the neck down.  DO NOT shower/wash with your  normal soap after using and rinsing off the CHG Soap.  Pat yourself dry with a CLEAN TOWEL.  Wear CLEAN PAJAMAS to bed the night before surgery  Place CLEAN SHEETS on your bed the night before your surgery  DO NOT SLEEP WITH PETS.   Day of Surgery: Take a shower with CHG soap.  Do not wear jewelry or makeup Do not wear lotions, powders, perfumes/colognes, or deodorant. Do not shave 48 hours prior to surgery. Do not bring valuables to the hospital.  [facility] is not responsible for any belongings or valuables. Do not wear nail polish, gel polish, artificial nails, or any other type of covering on natural nails (fingers and toes) If you have artificial nails or gel coating that need to be removed by a nail salon, please have this removed prior to surgery. Artificial  nails or gel coating may interfere with anesthesia's ability to adequately monitor your vital signs.  Wear Clean/Comfortable clothing the morning of surgery  Remember to brush your teeth WITH YOUR REGULAR TOOTHPASTE.   Please read over the following fact sheets that you were given.    If you received a COVID test during your pre-op visit  it is requested that you wear a mask when out in public, stay away from anyone that may not be feeling well and notify your surgeon if you develop symptoms. If you have been in contact with anyone that has tested positive in the last 10 days please notify you surgeon.

## 2022-02-20 ENCOUNTER — Encounter (HOSPITAL_COMMUNITY): Payer: Self-pay

## 2022-02-20 ENCOUNTER — Encounter (HOSPITAL_COMMUNITY)
Admission: RE | Admit: 2022-02-20 | Discharge: 2022-02-20 | Disposition: A | Discharge status: Home / Self Care | Payer: Medicare Other | Source: Ambulatory Visit | Attending: Neurosurgery | Admitting: Neurosurgery

## 2022-02-20 ENCOUNTER — Ambulatory Visit
Admission: RE | Admit: 2022-02-20 | Discharge: 2022-02-20 | Disposition: A | Discharge status: Home / Self Care | Payer: Medicare Other | Source: Ambulatory Visit | Attending: Family Medicine | Admitting: Family Medicine

## 2022-02-20 ENCOUNTER — Other Ambulatory Visit: Payer: Self-pay | Admitting: Family Medicine

## 2022-02-20 ENCOUNTER — Other Ambulatory Visit: Payer: Self-pay

## 2022-02-20 VITALS — BP 146/85 | HR 88 | Temp 98.4°F | Resp 18 | Ht 66.0 in | Wt 276.0 lb

## 2022-02-20 DIAGNOSIS — M170 Bilateral primary osteoarthritis of knee: Secondary | ICD-10-CM

## 2022-02-20 DIAGNOSIS — Z6841 Body Mass Index (BMI) 40.0 and over, adult: Secondary | ICD-10-CM | POA: Diagnosis not present

## 2022-02-20 DIAGNOSIS — Z9049 Acquired absence of other specified parts of digestive tract: Secondary | ICD-10-CM | POA: Diagnosis not present

## 2022-02-20 DIAGNOSIS — M4316 Spondylolisthesis, lumbar region: Secondary | ICD-10-CM | POA: Insufficient documentation

## 2022-02-20 DIAGNOSIS — I2510 Atherosclerotic heart disease of native coronary artery without angina pectoris: Secondary | ICD-10-CM | POA: Diagnosis not present

## 2022-02-20 DIAGNOSIS — Z981 Arthrodesis status: Secondary | ICD-10-CM | POA: Diagnosis not present

## 2022-02-20 DIAGNOSIS — R0609 Other forms of dyspnea: Secondary | ICD-10-CM | POA: Diagnosis not present

## 2022-02-20 DIAGNOSIS — R7303 Prediabetes: Secondary | ICD-10-CM | POA: Insufficient documentation

## 2022-02-20 DIAGNOSIS — M797 Fibromyalgia: Secondary | ICD-10-CM | POA: Diagnosis not present

## 2022-02-20 DIAGNOSIS — Z01818 Encounter for other preprocedural examination: Secondary | ICD-10-CM | POA: Diagnosis present

## 2022-02-20 DIAGNOSIS — J439 Emphysema, unspecified: Secondary | ICD-10-CM | POA: Insufficient documentation

## 2022-02-20 DIAGNOSIS — I10 Essential (primary) hypertension: Secondary | ICD-10-CM | POA: Insufficient documentation

## 2022-02-20 DIAGNOSIS — D869 Sarcoidosis, unspecified: Secondary | ICD-10-CM | POA: Diagnosis not present

## 2022-02-20 DIAGNOSIS — I700 Atherosclerosis of aorta: Secondary | ICD-10-CM | POA: Insufficient documentation

## 2022-02-20 DIAGNOSIS — Z87891 Personal history of nicotine dependence: Secondary | ICD-10-CM | POA: Diagnosis not present

## 2022-02-20 HISTORY — DX: Snoring: R06.83

## 2022-02-20 HISTORY — DX: Headache, unspecified: R51.9

## 2022-02-20 HISTORY — DX: Prediabetes: R73.03

## 2022-02-20 LAB — BASIC METABOLIC PANEL
Anion gap: 9 (ref 5–15)
BUN: 12 mg/dL (ref 8–23)
CO2: 26 mmol/L (ref 22–32)
Calcium: 9.2 mg/dL (ref 8.9–10.3)
Chloride: 106 mmol/L (ref 98–111)
Creatinine, Ser: 0.81 mg/dL (ref 0.44–1.00)
GFR, Estimated: >60 mL/min (ref >60)
Glucose, Bld: 187 mg/dL — ABNORMAL HIGH (ref 70–99)
Potassium: 4 mmol/L (ref 3.5–5.1)
Sodium: 141 mmol/L (ref 135–145)

## 2022-02-20 LAB — TYPE AND SCREEN
ABO/RH(D): A POS
Antibody Screen: NEGATIVE

## 2022-02-20 LAB — CBC
HCT: 40 % (ref 36.0–46.0)
Hemoglobin: 12.7 g/dL (ref 12.0–15.0)
MCH: 26.7 pg (ref 26.0–34.0)
MCHC: 31.8 g/dL (ref 30.0–36.0)
MCV: 84 fL (ref 80.0–100.0)
Platelets: 306 10*3/uL (ref 150–400)
RBC: 4.76 MIL/uL (ref 3.87–5.11)
RDW: 13.5 % (ref 11.5–15.5)
WBC: 9.3 10*3/uL (ref 4.0–10.5)
nRBC: 0 % (ref 0.0–0.2)

## 2022-02-20 LAB — SURGICAL PCR SCREEN
MRSA, PCR: NEGATIVE
Staphylococcus aureus: NEGATIVE

## 2022-02-20 NOTE — Progress Notes (Addendum)
PCP - Cletis [place] Cardiologist - denies  Chest x-ray - n/a EKG - 02/20/22  Aspirin Instructions: follow your surgeon's instructions on when to stop taking  ERAS Protcol - yes, no drink ordered or given   Anesthesia review: yes, abnormal EKG  Patient denies shortness of breath, fever, cough and chest pain at PAT appointment   All instructions explained to the patient, with a verbal understanding of the material. Patient agrees to go over the instructions while at home for a better understanding. Patient also instructed to self quarantine after being tested for COVID-19. The opportunity to ask questions was provided.

## 2022-02-20 NOTE — Pre-Procedure Instructions (Signed)
Surgical Instructions    Your procedure is scheduled on February 26, 2022.  Report to [facility] Main Entrance "A" at 6:30 A.M., then check in with the Admitting office.  Call this number if you have problems the morning of surgery:  [phone]   If you have any questions prior to your surgery date call [phone]: Open Monday-Friday 8am-4pm    Remember:  Do not eat after midnight the night before your surgery  You may drink clear liquids until 5:30 AM the morning of your surgery.   Clear liquids allowed are: Water, Non-Citrus Juices (without pulp), Carbonated Beverages, Clear Tea, Black Coffee Only (NO MILK, CREAM OR POWDERED CREAMER of any kind), and Gatorade.    Take these medicines the morning of surgery with A SIP OF WATER:  amLODipine (NORVASC)  DULoxetine (CYMBALTA)  pantoprazole (PROTONIX)    Take these medicines the morning of surgery AS NEEDED:  acetaminophen (TYLENOL)   fluticasone (FLONASE)  meclizine (ANTIVERT)  As of today, STOP taking any Aspirin (unless otherwise instructed by your surgeon) Aleve, Naproxen, Ibuprofen, Motrin, Advil, Goody's, BC's, all herbal medications, fish oil, and all vitamins.                  Do NOT Smoke (Tobacco/Vaping) for 24 hours prior to your procedure.  If you use a CPAP at night, you may bring your mask/headgear for your overnight stay.   Contacts, glasses, piercing's, hearing aid's, dentures or partials may not be worn into surgery, please bring cases for these belongings.    For patients admitted to the hospital, discharge time will be determined by your treatment team.   Patients discharged the day of surgery will not be allowed to drive home, and someone needs to stay with them for 24 hours.  SURGICAL WAITING ROOM VISITATION Patients having surgery or a procedure may have two support people in the waiting room. These visitors may be switched out with other visitors if needed. Children under the age of 16 must have an adult  accompany them who is not the patient. If the patient needs to stay at the hospital during part of their recovery, the visitor guidelines for inpatient rooms apply.  Please refer to the [facility] website for the visitor guidelines for Inpatients (after your surgery is over and you are in a regular room).    Special instructions:   [facility]- Preparing For Surgery  Before surgery, you can play an important role. Because skin is not sterile, your skin needs to be as free of germs as possible. You can reduce the number of germs on your skin by washing with CHG (chlorahexidine gluconate) Soap before surgery.  CHG is an antiseptic cleaner which kills germs and bonds with the skin to continue killing germs even after washing.    Oral Hygiene is also important to reduce your risk of infection.  Remember - BRUSH YOUR TEETH THE MORNING OF SURGERY WITH YOUR REGULAR TOOTHPASTE  Please do not use if you have an allergy to CHG or antibacterial soaps. If your skin becomes reddened/irritated stop using the CHG.  Do not shave (including legs and underarms) for at least 48 hours prior to first CHG shower. It is OK to shave your face.  Please follow these instructions carefully.   Shower the NIGHT BEFORE SURGERY and the MORNING OF SURGERY  If you chose to wash your hair, wash your hair first as usual with your normal shampoo.  After you shampoo, rinse your hair and body thoroughly  to remove the shampoo.  Use CHG Soap as you would any other liquid soap. You can apply CHG directly to the skin and wash gently with a scrungie or a clean washcloth.   Apply the CHG Soap to your body ONLY FROM THE NECK DOWN.  Do not use on open wounds or open sores. Avoid contact with your eyes, ears, mouth and genitals (private parts). Wash Face and genitals (private parts)  with your normal soap.   Wash thoroughly, paying special attention to the area where your surgery will be performed.  Thoroughly rinse your body with  warm water from the neck down.  DO NOT shower/wash with your normal soap after using and rinsing off the CHG Soap.  Pat yourself dry with a CLEAN TOWEL.  Wear CLEAN PAJAMAS to bed the night before surgery  Place CLEAN SHEETS on your bed the night before your surgery  DO NOT SLEEP WITH PETS.   Day of Surgery: Take a shower with CHG soap.  Do not wear jewelry or makeup Do not wear lotions, powders, perfumes/colognes, or deodorant. Do not shave 48 hours prior to surgery. Do not bring valuables to the hospital.  [facility] is not responsible for any belongings or valuables. Do not wear nail polish, gel polish, artificial nails, or any other type of covering on natural nails (fingers and toes) If you have artificial nails or gel coating that need to be removed by a nail salon, please have this removed prior to surgery. Artificial nails or gel coating may interfere with anesthesia's ability to adequately monitor your vital signs.  Wear Clean/Comfortable clothing the morning of surgery  Remember to brush your teeth WITH YOUR REGULAR TOOTHPASTE.   Please read over the following fact sheets that you were given.

## 2022-02-21 ENCOUNTER — Encounter (HOSPITAL_COMMUNITY): Payer: Self-pay

## 2022-02-21 NOTE — Progress Notes (Signed)
Anesthesia Chart Review:  Case: [id number] Date/Time: 02/26/22 0815   Procedure: L3-4 PLIF - 3C/RM 18   Anesthesia type: General   Pre-op diagnosis: Spondylolisthesis, Lumbar region   Location: [facility] ROOM 21 / [facility]   Surgeons: Coletta Memos, MD       DISCUSSION: Patient is a 66 year old female scheduled for the above procedure. S/p L4-S1 PLIF on 06/28/21 with initial relief of symptoms, but now with recurrent back and leg pain.  History includes former smoker (quit 10/23/17, 23 pack years), HTN, prediabetes, fibromyalgia, LE edema (11/01/20), dyspnea (chronic DOE), scoliosis, RLS, appendectomy (10/28/17), spinal surgery (L4-S1 PLIF 06/28/21), Sarcoidosis (~2000; does not follow with pulmonology). BMI is consistent with morbid obesity.  Preoperative EKG showed new RBBB when compared to 11/01/20 EKG. She tolerated two level lumbar fusion 06/28/21. Denied any anesthesia concerns. She is a former smoker. Last A1c was 6.2% 06/18/21. No known CAD/HF, although with LAD calcifications on recent lung cancer screening chest CT. Denied chest pain and SOB. Dicussed with anesthesiologist Achille Rich, MD with recommendation to contact patient to assess for any changes. I called and spoke with her. She says new new issues since surgery. No chest pain, SOB, orthopnea, palpitations, syncope. She does have some BLE edema, primarily in her knees and was told she has arthritis. She has stable DOE. Her activity is limited due to her back, legs, and knee pains, but she is able to vacuum her hardwood floors, run errands, walk in stores, and walk at least two blocks to the local grocery store and back. She does have DOE with prolonged walking, but says it has been stable since even before her last surgery. She does snore, so is going to have a sleep study. Her PCP reportedly reviewed her chest CT results and is going to start her on ASA 81 mg and a statin after surgery. She reported a remote history of being diagnosed with  pulmonary Sarcoid, but no recent issues and is not followed by a pulmonologist.   She denied new symptoms or change in activity tolerance since her surgery 8 months ago. Anesthesia team to evaluate on the day of surgery.    VS: BP (!) 146/85   Pulse 88   Temp 36.9 C (Oral)   Resp 18   Ht 5\' 6"  (1.676 m)   Wt 125.2 kg   SpO2 100%   BMI 44.55 kg/m    PROVIDERS: Debria Garret, DNP is PCP [facility]), previously saw Beverley Fiedler, MD with [facility] prior to her relocating.   LABS: Labs reviewed: Acceptable for surgery. A1c 6.2% 06/18/21.  (all labs ordered are listed, but only abnormal results are displayed)  Labs Reviewed  BASIC METABOLIC PANEL - Abnormal; Notable for the following components:      Result Value   Glucose, Bld 187 (*)    All other components within normal limits  SURGICAL PCR SCREEN  CBC  TYPE AND SCREEN    PFTs > 5 year ago.   IMAGES: CT L-spine with myelogram 01/17/22: IMPRESSION: - Interval laminectomy and posterior spinal fusion at the L4-L5 and L5-S1 levels. No significant residual spinal canal stenosis at these levels. Moderate bilateral neural foraminal narrowing at these levels. Unchanged 4 mm L4-L5 grade 1 anterolisthesis. - Additional lumbar and lower thoracic spondylosis, as outlined and having not significantly changed from the prior MRI of 05/19/2021. Findings are most notably as follows. - At L3-L4, there is trace grade 1 retrolisthesis. Disc bulge. Moderate facet arthrosis with bulky  ligamentum flavum hypertrophy. Mild to moderate bilateral subarticular and central canal stenosis. Mild-to-moderate bilateral neural foraminal narrowing. - Lumbar dextrocurvature. - Aortic Atherosclerosis (ICD10-I70.0).  CT Chest lung cancer screening 12/27/21: IMPRESSION: 1. Lung-RADS 1, negative. Continue annual screening with low-dose chest CT without contrast in 12 months. 2. Aortic Atherosclerosis (ICD10-I70.0) and Emphysema  (ICD10-J43.9). Coronary artery atherosclerosis. -Aortic atherosclerosis.  Normal heart size.  LAD coronary artery calcification - Mild centrilobular emphysema - Left greater than right adrenal nodularity has been present back to 2016 and most consistent with small adenomas.    EKG: 02/20/22: Normal sinus rhythm Possible Left atrial enlargement Right bundle branch block Abnormal ECG When compared with ECG of 07-Jun-2017 07:12, PREVIOUS ECG IS PRESENT Right bundle branch block New since previous tracing Confirmed by Olga Millers ([id number]) on 02/20/2022 2:11:28 PM  EKG 11/01/20: NSR   CV: N/A   Past Medical History:  Diagnosis Date   Arthritis    Back pain    Constipation    Depression    Fibromyalgia    Headache    Herniated disc    Hypertension    Joint pain    Knee pain    Lower extremity edema    Pre-diabetes    Prediabetes    Restless leg syndrome    Sarcoidosis 2000   Sciatica    Scoliosis    Snoring    SOB (shortness of breath)    Vitamin D deficiency     Past Surgical History:  Procedure Laterality Date   ABDOMINAL HYSTERECTOMY  1999   APPENDECTOMY  2019   BREAST BIOPSY Right 12/2016   LAPAROSCOPIC APPENDECTOMY N/A 10/28/2017   Procedure: APPENDECTOMY LAPAROSCOPIC;  Surgeon: Romie Levee, MD;  Location: [facility]S;  Service: General;  Laterality: N/A;   TUBAL LIGATION      MEDICATIONS:  acetaminophen (TYLENOL) 650 MG CR tablet   amLODipine (NORVASC) 5 MG tablet   aspirin EC 81 MG tablet   Biotin w/ Vitamins C & E (HAIR/SKIN/NAILS PO)   Cholecalciferol (VITAMIN D) 125 MCG (5000 UT) CAPS   DULoxetine (CYMBALTA) 20 MG capsule   fluticasone (FLONASE) 50 MCG/ACT nasal spray   gabapentin (NEURONTIN) 300 MG capsule   hydrochlorothiazide (MICROZIDE) 12.5 MG capsule   losartan (COZAAR) 100 MG tablet   pantoprazole (PROTONIX) 20 MG tablet   potassium chloride SA (KLOR-CON) 20 MEQ tablet   rOPINIRole (REQUIP) 0.5 MG tablet   Semaglutide, 1 MG/DOSE, 4  MG/3ML SOPN   tiZANidine (ZANAFLEX) 4 MG tablet   No current facility-administered medications for this encounter.  She is not currently taking semaglutide. ASA is on hold.    Shonna Chock, PA-C Surgical Short Stay/Anesthesiology [facility] Phone [phone] [facility] Phone [phone] 02/21/2022 6:59 PM

## 2022-02-21 NOTE — Anesthesia Preprocedure Evaluation (Addendum)
Anesthesia Evaluation  Patient identified by MRN, date of birth, ID band Patient awake    Reviewed: Allergy & Precautions, NPO status , Patient's Chart, lab work & pertinent test results  History of Anesthesia Complications Negative for: history of anesthetic complications  Airway Mallampati: II  TM Distance: >3 FB Neck ROM: Full    Dental  (+) Edentulous Upper, Edentulous Lower   Pulmonary former smoker,    Pulmonary exam normal        Cardiovascular hypertension, Pt. on medications Normal cardiovascular exam     Neuro/Psych  Headaches, PSYCHIATRIC DISORDERS Depression  Neuromuscular disease (RLS)    GI/Hepatic Neg liver ROS, GERD  Medicated and Controlled,  Endo/Other  Morbid obesity Pre-DM   Renal/GU negative Renal ROS     Musculoskeletal  (+) Arthritis , Fibromyalgia - Scoliosis    Abdominal   Peds  Hematology negative hematology ROS (+)   Anesthesia Other Findings Sarcoidosis   Reproductive/Obstetrics                           Anesthesia Physical Anesthesia Plan  ASA: 3  Anesthesia Plan: General   Post-op Pain Management: Tylenol PO (pre-op)* and Celebrex PO (pre-op)*   Induction: Intravenous  PONV Risk Score and Plan: 3 and Treatment may vary due to age or medical condition, Ondansetron and Dexamethasone  Airway Management Planned: Oral ETT  Additional Equipment: None  Intra-op Plan:   Post-operative Plan: Extubation in OR  Informed Consent: I have reviewed the patients History and Physical, chart, labs and discussed the procedure including the risks, benefits and alternatives for the proposed anesthesia with the patient or authorized representative who has indicated his/her understanding and acceptance.     Dental advisory given  Plan Discussed with: CRNA and Anesthesiologist  Anesthesia Plan Comments:       Anesthesia Quick Evaluation

## 2022-02-26 ENCOUNTER — Other Ambulatory Visit: Payer: Self-pay

## 2022-02-26 ENCOUNTER — Encounter (HOSPITAL_COMMUNITY): Payer: Self-pay | Admitting: Neurosurgery

## 2022-02-26 ENCOUNTER — Ambulatory Visit (HOSPITAL_COMMUNITY): Payer: Medicare Other | Admitting: Vascular Surgery

## 2022-02-26 ENCOUNTER — Inpatient Hospital Stay (HOSPITAL_COMMUNITY)
Admission: AD | Admit: 2022-02-26 | Discharge: 2022-03-02 | DRG: 460 | Disposition: A | Discharge status: Home / Self Care | Payer: Medicare Other | Attending: Neurosurgery | Admitting: Neurosurgery

## 2022-02-26 ENCOUNTER — Encounter (HOSPITAL_COMMUNITY)
Admission: AD | Disposition: A | Discharge status: Home / Self Care | Payer: Self-pay | Source: Home / Self Care | Attending: Neurosurgery

## 2022-02-26 ENCOUNTER — Ambulatory Visit (HOSPITAL_COMMUNITY): Payer: Medicare Other

## 2022-02-26 ENCOUNTER — Ambulatory Visit (HOSPITAL_BASED_OUTPATIENT_CLINIC_OR_DEPARTMENT_OTHER): Payer: Medicare Other | Admitting: Anesthesiology

## 2022-02-26 DIAGNOSIS — E119 Type 2 diabetes mellitus without complications: Secondary | ICD-10-CM | POA: Diagnosis present

## 2022-02-26 DIAGNOSIS — Z87891 Personal history of nicotine dependence: Secondary | ICD-10-CM | POA: Diagnosis not present

## 2022-02-26 DIAGNOSIS — M5136 Other intervertebral disc degeneration, lumbar region: Secondary | ICD-10-CM | POA: Diagnosis present

## 2022-02-26 DIAGNOSIS — D869 Sarcoidosis, unspecified: Secondary | ICD-10-CM | POA: Diagnosis present

## 2022-02-26 DIAGNOSIS — M48061 Spinal stenosis, lumbar region without neurogenic claudication: Secondary | ICD-10-CM | POA: Diagnosis present

## 2022-02-26 DIAGNOSIS — Z886 Allergy status to analgesic agent status: Secondary | ICD-10-CM | POA: Diagnosis not present

## 2022-02-26 DIAGNOSIS — M797 Fibromyalgia: Secondary | ICD-10-CM | POA: Diagnosis present

## 2022-02-26 DIAGNOSIS — Z888 Allergy status to other drugs, medicaments and biological substances status: Secondary | ICD-10-CM

## 2022-02-26 DIAGNOSIS — Z8249 Family history of ischemic heart disease and other diseases of the circulatory system: Secondary | ICD-10-CM

## 2022-02-26 DIAGNOSIS — Z6841 Body Mass Index (BMI) 40.0 and over, adult: Secondary | ICD-10-CM | POA: Diagnosis not present

## 2022-02-26 DIAGNOSIS — I10 Essential (primary) hypertension: Secondary | ICD-10-CM

## 2022-02-26 DIAGNOSIS — R112 Nausea with vomiting, unspecified: Secondary | ICD-10-CM | POA: Diagnosis not present

## 2022-02-26 DIAGNOSIS — M4316 Spondylolisthesis, lumbar region: Principal | ICD-10-CM | POA: Diagnosis present

## 2022-02-26 DIAGNOSIS — Z833 Family history of diabetes mellitus: Secondary | ICD-10-CM

## 2022-02-26 LAB — CBC
HCT: 35.1 % — ABNORMAL LOW (ref 36.0–46.0)
Hemoglobin: 11.8 g/dL — ABNORMAL LOW (ref 12.0–15.0)
MCH: 27.5 pg (ref 26.0–34.0)
MCHC: 33.6 g/dL (ref 30.0–36.0)
MCV: 81.8 fL (ref 80.0–100.0)
Platelets: 247 10*3/uL (ref 150–400)
RBC: 4.29 MIL/uL (ref 3.87–5.11)
RDW: 13.4 % (ref 11.5–15.5)
WBC: 17.7 10*3/uL — ABNORMAL HIGH (ref 4.0–10.5)
nRBC: 0 % (ref 0.0–0.2)

## 2022-02-26 LAB — CREATININE, SERUM
Creatinine, Ser: 0.87 mg/dL (ref 0.44–1.00)
GFR, Estimated: >60 mL/min (ref >60)

## 2022-02-26 SURGERY — POSTERIOR LUMBAR FUSION 1 LEVEL
Anesthesia: General

## 2022-02-26 MED ORDER — PHENOL 1.4 % MT LIQD: 1.0000 | OROMUCOSAL | Status: DC | PRN

## 2022-02-26 MED ORDER — FLEET ENEMA 7-19 GM/118ML RE ENEM: 1.0000 | ENEMA | Freq: Once | RECTAL | Status: DC | PRN

## 2022-02-26 MED ORDER — ACETAMINOPHEN 325 MG PO TABS
650.0000 mg | ORAL_TABLET | ORAL | Status: DC | PRN
  Administered 2022-02-27 – 2022-03-01 (×3): 650 mg via ORAL
  Filled 2022-02-26 (×3): qty 2

## 2022-02-26 MED ORDER — PROPOFOL 10 MG/ML IV BOLUS
INTRAVENOUS | Status: AC
  Filled 2022-02-26: qty 20

## 2022-02-26 MED ORDER — BISACODYL 5 MG PO TBEC: 5.0000 mg | DELAYED_RELEASE_TABLET | Freq: Every day | ORAL | Status: DC | PRN

## 2022-02-26 MED ORDER — DIAZEPAM 5 MG PO TABS
5.0000 mg | ORAL_TABLET | Freq: Four times a day (QID) | ORAL | Status: DC | PRN
  Administered 2022-03-01: 5 mg via ORAL
  Filled 2022-02-26: qty 1

## 2022-02-26 MED ORDER — OXYCODONE HCL 5 MG PO TABS
10.0000 mg | ORAL_TABLET | ORAL | Status: DC | PRN
  Administered 2022-02-26 – 2022-03-02 (×13): 10 mg via ORAL
  Filled 2022-02-26 (×12): qty 2

## 2022-02-26 MED ORDER — ONDANSETRON HCL 4 MG/2ML IJ SOLN
INTRAMUSCULAR | Status: DC | PRN
  Administered 2022-02-26: 4 mg via INTRAVENOUS

## 2022-02-26 MED ORDER — SODIUM CHLORIDE 0.9% FLUSH
3.0000 mL | Freq: Two times a day (BID) | INTRAVENOUS | Status: DC
  Administered 2022-02-26 – 2022-03-01 (×7): 3 mL via INTRAVENOUS

## 2022-02-26 MED ORDER — SODIUM CHLORIDE 0.9% FLUSH
3.0000 mL | INTRAVENOUS | Status: DC | PRN
Start: 2022-02-26 — End: 2022-03-02

## 2022-02-26 MED ORDER — PANTOPRAZOLE SODIUM 20 MG PO TBEC
20.0000 mg | DELAYED_RELEASE_TABLET | Freq: Every morning | ORAL | Status: DC
  Administered 2022-02-27 – 2022-03-02 (×4): 20 mg via ORAL
  Filled 2022-02-26 (×5): qty 1

## 2022-02-26 MED ORDER — FENTANYL CITRATE (PF) 100 MCG/2ML IJ SOLN: 25.0000 ug | INTRAMUSCULAR | Status: DC | PRN

## 2022-02-26 MED ORDER — MIDAZOLAM HCL 2 MG/2ML IJ SOLN
INTRAMUSCULAR | Status: AC
  Filled 2022-02-26: qty 2

## 2022-02-26 MED ORDER — ROPINIROLE HCL 1 MG PO TABS
0.5000 mg | ORAL_TABLET | Freq: Every day | ORAL | Status: DC
  Administered 2022-02-26 – 2022-03-01 (×4): 0.5 mg via ORAL
  Filled 2022-02-26 (×4): qty 1

## 2022-02-26 MED ORDER — AMLODIPINE BESYLATE 5 MG PO TABS
5.0000 mg | ORAL_TABLET | Freq: Every day | ORAL | Status: DC
  Administered 2022-02-26 – 2022-03-02 (×4): 5 mg via ORAL
  Filled 2022-02-26 (×4): qty 1

## 2022-02-26 MED ORDER — HYDROCHLOROTHIAZIDE 25 MG PO TABS
12.5000 mg | ORAL_TABLET | Freq: Every morning | ORAL | Status: DC
  Administered 2022-02-27 – 2022-03-02 (×4): 12.5 mg via ORAL
  Filled 2022-02-26 (×5): qty 1

## 2022-02-26 MED ORDER — ACETAMINOPHEN 500 MG PO TABS
ORAL_TABLET | ORAL | Status: AC
  Administered 2022-02-26: 1000 mg
  Filled 2022-02-26: qty 2

## 2022-02-26 MED ORDER — ONDANSETRON HCL 4 MG/2ML IJ SOLN
INTRAMUSCULAR | Status: AC
  Filled 2022-02-26: qty 2

## 2022-02-26 MED ORDER — AMLODIPINE BESYLATE 5 MG PO TABS: 5.0000 mg | ORAL_TABLET | Freq: Every day | ORAL | Status: DC

## 2022-02-26 MED ORDER — DEXAMETHASONE SODIUM PHOSPHATE 10 MG/ML IJ SOLN
INTRAMUSCULAR | Status: AC
  Filled 2022-02-26: qty 1

## 2022-02-26 MED ORDER — FENTANYL CITRATE (PF) 250 MCG/5ML IJ SOLN
INTRAMUSCULAR | Status: AC
  Filled 2022-02-26: qty 5

## 2022-02-26 MED ORDER — LACTATED RINGERS IV SOLN: INTRAVENOUS | Status: DC | PRN

## 2022-02-26 MED ORDER — FENTANYL CITRATE (PF) 250 MCG/5ML IJ SOLN
INTRAMUSCULAR | Status: DC | PRN
  Administered 2022-02-26 (×5): 50 ug via INTRAVENOUS
  Administered 2022-02-26: 100 ug via INTRAVENOUS
  Administered 2022-02-26 (×7): 50 ug via INTRAVENOUS

## 2022-02-26 MED ORDER — ALBUMIN HUMAN 5 % IV SOLN: INTRAVENOUS | Status: DC | PRN

## 2022-02-26 MED ORDER — GABAPENTIN 300 MG PO CAPS
300.0000 mg | ORAL_CAPSULE | Freq: Every day | ORAL | Status: DC
  Administered 2022-02-26 – 2022-03-01 (×4): 300 mg via ORAL
  Filled 2022-02-26 (×4): qty 1

## 2022-02-26 MED ORDER — 0.9 % SODIUM CHLORIDE (POUR BTL) OPTIME
TOPICAL | Status: DC | PRN
  Administered 2022-02-26 (×2): 1000 mL

## 2022-02-26 MED ORDER — POTASSIUM CHLORIDE IN NACL 20-0.9 MEQ/L-% IV SOLN
INTRAVENOUS | Status: DC
  Filled 2022-02-26 (×4): qty 1000

## 2022-02-26 MED ORDER — CEFAZOLIN SODIUM-DEXTROSE 2-3 GM-%(50ML) IV SOLR
INTRAVENOUS | Status: DC | PRN
  Administered 2022-02-26 (×2): 2 g via INTRAVENOUS

## 2022-02-26 MED ORDER — VITAMIN D 25 MCG (1000 UNIT) PO TABS
5000.0000 [IU] | ORAL_TABLET | Freq: Every morning | ORAL | Status: DC
  Administered 2022-02-27 – 2022-03-02 (×4): 5000 [IU] via ORAL
  Filled 2022-02-26 (×4): qty 5

## 2022-02-26 MED ORDER — SODIUM CHLORIDE 0.9 % IV SOLN: 250.0000 mL | INTRAVENOUS | Status: DC

## 2022-02-26 MED ORDER — FLUTICASONE PROPIONATE 50 MCG/ACT NA SUSP: 1.0000 | Freq: Every day | NASAL | Status: DC | PRN

## 2022-02-26 MED ORDER — ONDANSETRON HCL 4 MG/2ML IJ SOLN
4.0000 mg | Freq: Once | INTRAMUSCULAR | Status: DC | PRN
Start: 2022-02-26 — End: 2022-02-26

## 2022-02-26 MED ORDER — CELECOXIB 200 MG PO CAPS
ORAL_CAPSULE | ORAL | Status: AC
  Administered 2022-02-26: 200 mg
  Filled 2022-02-26: qty 1

## 2022-02-26 MED ORDER — CEFAZOLIN IN SODIUM CHLORIDE 3-0.9 GM/100ML-% IV SOLN
INTRAVENOUS | Status: AC
  Filled 2022-02-26: qty 100

## 2022-02-26 MED ORDER — THROMBIN 20000 UNITS EX SOLR
CUTANEOUS | Status: AC
  Filled 2022-02-26: qty 20000

## 2022-02-26 MED ORDER — AMLODIPINE BESYLATE 5 MG PO TABS
5.0000 mg | ORAL_TABLET | Freq: Once | ORAL | Status: AC
Start: 2022-02-26 — End: 2022-02-26
  Administered 2022-02-26: 5 mg via ORAL
  Filled 2022-02-26: qty 1

## 2022-02-26 MED ORDER — PHENYLEPHRINE 80 MCG/ML (10ML) SYRINGE FOR IV PUSH (FOR BLOOD PRESSURE SUPPORT)
PREFILLED_SYRINGE | INTRAVENOUS | Status: DC | PRN
  Administered 2022-02-26 (×2): 80 ug via INTRAVENOUS

## 2022-02-26 MED ORDER — LIDOCAINE 2% (20 MG/ML) 5 ML SYRINGE
INTRAMUSCULAR | Status: AC
  Filled 2022-02-26: qty 5

## 2022-02-26 MED ORDER — POTASSIUM CHLORIDE CRYS ER 20 MEQ PO TBCR
20.0000 meq | EXTENDED_RELEASE_TABLET | Freq: Every evening | ORAL | Status: DC
  Administered 2022-02-26 – 2022-03-01 (×4): 20 meq via ORAL
  Filled 2022-02-26 (×4): qty 1

## 2022-02-26 MED ORDER — ONDANSETRON HCL 4 MG/2ML IJ SOLN
4.0000 mg | Freq: Four times a day (QID) | INTRAMUSCULAR | Status: DC | PRN
  Administered 2022-02-26 – 2022-02-28 (×3): 4 mg via INTRAVENOUS
  Filled 2022-02-26 (×3): qty 2

## 2022-02-26 MED ORDER — SENNOSIDES-DOCUSATE SODIUM 8.6-50 MG PO TABS
1.0000 | ORAL_TABLET | Freq: Every evening | ORAL | Status: DC | PRN
Start: 2022-02-26 — End: 2022-03-02

## 2022-02-26 MED ORDER — ROCURONIUM BROMIDE 10 MG/ML (PF) SYRINGE
PREFILLED_SYRINGE | INTRAVENOUS | Status: AC
  Filled 2022-02-26: qty 10

## 2022-02-26 MED ORDER — ROCURONIUM BROMIDE 10 MG/ML (PF) SYRINGE
PREFILLED_SYRINGE | INTRAVENOUS | Status: DC | PRN
  Administered 2022-02-26: 40 mg via INTRAVENOUS
  Administered 2022-02-26: 60 mg via INTRAVENOUS

## 2022-02-26 MED ORDER — BUPIVACAINE HCL (PF) 0.5 % IJ SOLN
INTRAMUSCULAR | Status: AC
  Filled 2022-02-26: qty 30

## 2022-02-26 MED ORDER — DULOXETINE HCL 20 MG PO CPEP
20.0000 mg | ORAL_CAPSULE | Freq: Two times a day (BID) | ORAL | Status: DC
  Administered 2022-02-26 – 2022-03-02 (×8): 20 mg via ORAL
  Filled 2022-02-26 (×9): qty 1

## 2022-02-26 MED ORDER — MIDAZOLAM HCL 2 MG/2ML IJ SOLN
INTRAMUSCULAR | Status: DC | PRN
  Administered 2022-02-26: 2 mg via INTRAVENOUS

## 2022-02-26 MED ORDER — BUPIVACAINE HCL (PF) 0.5 % IJ SOLN
INTRAMUSCULAR | Status: DC | PRN
  Administered 2022-02-26: 30 mL

## 2022-02-26 MED ORDER — SENNA 8.6 MG PO TABS
1.0000 | ORAL_TABLET | Freq: Two times a day (BID) | ORAL | Status: DC
Start: 2022-02-26 — End: 2022-03-02
  Administered 2022-02-27 – 2022-03-02 (×6): 8.6 mg via ORAL
  Filled 2022-02-26 (×8): qty 1

## 2022-02-26 MED ORDER — LIDOCAINE 2% (20 MG/ML) 5 ML SYRINGE
INTRAMUSCULAR | Status: DC | PRN
  Administered 2022-02-26: 60 mg via INTRAVENOUS

## 2022-02-26 MED ORDER — DEXAMETHASONE SODIUM PHOSPHATE 10 MG/ML IJ SOLN
INTRAMUSCULAR | Status: DC | PRN
  Administered 2022-02-26: 10 mg via INTRAVENOUS

## 2022-02-26 MED ORDER — PROPOFOL 10 MG/ML IV BOLUS
INTRAVENOUS | Status: DC | PRN
  Administered 2022-02-26: 150 mg via INTRAVENOUS

## 2022-02-26 MED ORDER — LIDOCAINE-EPINEPHRINE 0.5 %-1:200000 IJ SOLN
INTRAMUSCULAR | Status: AC
  Filled 2022-02-26: qty 1

## 2022-02-26 MED ORDER — MENTHOL 3 MG MT LOZG: 1.0000 | LOZENGE | OROMUCOSAL | Status: DC | PRN

## 2022-02-26 MED ORDER — ASPIRIN 81 MG PO TBEC
81.0000 mg | DELAYED_RELEASE_TABLET | Freq: Every day | ORAL | Status: DC
  Administered 2022-02-26 – 2022-03-02 (×5): 81 mg via ORAL
  Filled 2022-02-26 (×5): qty 1

## 2022-02-26 MED ORDER — ONDANSETRON HCL 4 MG PO TABS: 4.0000 mg | ORAL_TABLET | Freq: Four times a day (QID) | ORAL | Status: DC | PRN

## 2022-02-26 MED ORDER — CHLORHEXIDINE GLUCONATE 0.12 % MT SOLN
OROMUCOSAL | Status: AC
  Administered 2022-02-26: 15 mL
  Filled 2022-02-26: qty 15

## 2022-02-26 MED ORDER — LOSARTAN POTASSIUM 50 MG PO TABS
100.0000 mg | ORAL_TABLET | Freq: Every morning | ORAL | Status: DC
  Administered 2022-02-26 – 2022-03-02 (×5): 100 mg via ORAL
  Filled 2022-02-26 (×5): qty 2

## 2022-02-26 MED ORDER — HAIR/SKIN/NAILS PO TABS: ORAL_TABLET | Freq: Every day | ORAL | Status: DC

## 2022-02-26 MED ORDER — ACETAMINOPHEN 650 MG RE SUPP: 650.0000 mg | RECTAL | Status: DC | PRN

## 2022-02-26 MED ORDER — OXYCODONE HCL 5 MG/5ML PO SOLN: 5.0000 mg | Freq: Once | ORAL | Status: DC | PRN

## 2022-02-26 MED ORDER — PHENYLEPHRINE 80 MCG/ML (10ML) SYRINGE FOR IV PUSH (FOR BLOOD PRESSURE SUPPORT)
PREFILLED_SYRINGE | INTRAVENOUS | Status: AC
  Filled 2022-02-26: qty 10

## 2022-02-26 MED ORDER — OXYCODONE HCL 5 MG PO TABS
5.0000 mg | ORAL_TABLET | ORAL | Status: DC | PRN
  Administered 2022-02-27: 5 mg via ORAL
  Filled 2022-02-26: qty 1

## 2022-02-26 MED ORDER — CEFAZOLIN SODIUM 1 G IJ SOLR
INTRAMUSCULAR | Status: AC
  Filled 2022-02-26: qty 20

## 2022-02-26 MED ORDER — OXYCODONE HCL 5 MG PO TABS: 5.0000 mg | ORAL_TABLET | Freq: Once | ORAL | Status: DC | PRN

## 2022-02-26 MED ORDER — HEPARIN SODIUM (PORCINE) 5000 UNIT/ML IJ SOLN
5000.0000 [IU] | Freq: Three times a day (TID) | INTRAMUSCULAR | Status: DC
Start: 2022-02-26 — End: 2022-03-02
  Administered 2022-02-26 – 2022-03-02 (×11): 5000 [IU] via SUBCUTANEOUS
  Filled 2022-02-26 (×11): qty 1

## 2022-02-26 MED ORDER — THROMBIN 20000 UNITS EX SOLR
CUTANEOUS | Status: DC | PRN
  Administered 2022-02-26: 20 mL via TOPICAL

## 2022-02-26 MED ORDER — PHENYLEPHRINE HCL-NACL 20-0.9 MG/250ML-% IV SOLN
INTRAVENOUS | Status: DC | PRN
  Administered 2022-02-26: 50 ug/min via INTRAVENOUS

## 2022-02-26 SURGICAL SUPPLY — 78 items
ADH SKN CLS APL DERMABOND .7 (GAUZE/BANDAGES/DRESSINGS) ×1
APL SKNCLS STERI-STRIP NONHPOA (GAUZE/BANDAGES/DRESSINGS)
BAG COUNTER SPONGE SURGICOUNT (BAG) ×2 IMPLANT
BAG SPNG CNTER NS LX DISP (BAG) ×1
BASKET BONE COLLECTION (BASKET) ×1 IMPLANT
BENZOIN TINCTURE PRP APPL 2/3 (GAUZE/BANDAGES/DRESSINGS) IMPLANT
BIT DRILL PLIF MAS DISP 5.5MM (DRILL) IMPLANT
BLADE BN FN 3.2XSTRL LF (MISCELLANEOUS) IMPLANT
BLADE BONE MILL FINE (MISCELLANEOUS) ×2
BLADE BONE MILL MEDIUM (MISCELLANEOUS) ×2 IMPLANT
BLADE CLIPPER SURG (BLADE) IMPLANT
BUR MATCHSTICK NEURO 3.0 LAGG (BURR) ×2 IMPLANT
BUR PRECISION FLUTE 5.0 (BURR) ×2 IMPLANT
CAGE CONVEX CASCADIA 8.5X22X12 (Cage) ×2 IMPLANT
CANISTER SUCT 3000ML PPV (MISCELLANEOUS) ×2 IMPLANT
CAP RELINE MOD TULIP RMM (Cap) ×5 IMPLANT
CARTRIDGE OIL MAESTRO DRILL (MISCELLANEOUS) ×1 IMPLANT
CNTNR URN SCR LID CUP LEK RST (MISCELLANEOUS) ×1 IMPLANT
CONT SPEC 4OZ STRL OR WHT (MISCELLANEOUS) ×2
COVER BACK TABLE 60X90IN (DRAPES) ×2 IMPLANT
DERMABOND ADVANCED (GAUZE/BANDAGES/DRESSINGS) ×1
DERMABOND ADVANCED .7 DNX12 (GAUZE/BANDAGES/DRESSINGS) ×1 IMPLANT
DIFFUSER DRILL AIR PNEUMATIC (MISCELLANEOUS) ×2 IMPLANT
DRAPE C-ARM 42X72 X-RAY (DRAPES) ×3 IMPLANT
DRAPE C-ARMOR (DRAPES) ×1 IMPLANT
DRAPE LAPAROTOMY 100X72X124 (DRAPES) ×2 IMPLANT
DRAPE SURG 17X23 STRL (DRAPES) ×1 IMPLANT
DRILL PLIF MAS DISP 5.5MM (DRILL) ×2
DURAPREP 26ML APPLICATOR (WOUND CARE) ×2 IMPLANT
ELECT REM PT RETURN 9FT ADLT (ELECTROSURGICAL) ×2
ELECTRODE REM PT RTRN 9FT ADLT (ELECTROSURGICAL) ×1 IMPLANT
GAUZE 4X4 16PLY ~~LOC~~+RFID DBL (SPONGE) IMPLANT
GAUZE SPONGE 4X4 12PLY STRL (GAUZE/BANDAGES/DRESSINGS) IMPLANT
GLOVE BIO SURGEON STRL SZ8 (GLOVE) ×1 IMPLANT
GLOVE BIO SURGEON STRL SZ8.5 (GLOVE) ×1 IMPLANT
GLOVE BIOGEL PI IND STRL 7.5 (GLOVE) IMPLANT
GLOVE BIOGEL PI INDICATOR 7.5 (GLOVE) ×5
GLOVE ECLIPSE 7.0 STRL STRAW (GLOVE) ×4 IMPLANT
GLOVE EXAM NITRILE XL STR (GLOVE) IMPLANT
GLOVE SURG LTX SZ6.5 (GLOVE) ×4 IMPLANT
GLOVE SURG SS PI 7.0 STRL IVOR (GLOVE) ×1 IMPLANT
GOWN STRL REUS W/ TWL LRG LVL3 (GOWN DISPOSABLE) ×2 IMPLANT
GOWN STRL REUS W/ TWL XL LVL3 (GOWN DISPOSABLE) IMPLANT
GOWN STRL REUS W/TWL 2XL LVL3 (GOWN DISPOSABLE) IMPLANT
GOWN STRL REUS W/TWL LRG LVL3 (GOWN DISPOSABLE) ×4
GOWN STRL REUS W/TWL XL LVL3 (GOWN DISPOSABLE) ×6
KIT BASIN OR (CUSTOM PROCEDURE TRAY) ×2 IMPLANT
KIT POSITION SURG JACKSON T1 (MISCELLANEOUS) ×2 IMPLANT
KIT TURNOVER KIT B (KITS) ×2 IMPLANT
MILL BONE PREP (MISCELLANEOUS) ×1 IMPLANT
NDL HYPO 25X1 1.5 SAFETY (NEEDLE) ×1 IMPLANT
NDL SPNL 18GX3.5 QUINCKE PK (NEEDLE) IMPLANT
NEEDLE HYPO 25X1 1.5 SAFETY (NEEDLE) ×2 IMPLANT
NEEDLE SPNL 18GX3.5 QUINCKE PK (NEEDLE) ×2 IMPLANT
NS IRRIG 1000ML POUR BTL (IV SOLUTION) ×3 IMPLANT
OIL CARTRIDGE MAESTRO DRILL (MISCELLANEOUS) ×2
PACK LAMINECTOMY NEURO (CUSTOM PROCEDURE TRAY) ×2 IMPLANT
PAD ARMBOARD 7.5X6 YLW CONV (MISCELLANEOUS) ×4 IMPLANT
ROD RELINE COCR LORD 5.0X70MM (Rod) ×1 IMPLANT
ROD RELINE O COCR 5.0X50MM (Rod) ×1 IMPLANT
SCREW LOCK RSS 4.5/5.0MM (Screw) ×7 IMPLANT
SCREW SHANK RELINE 6.5X40MM (Screw) ×3 IMPLANT
SCREW SHANK RELINE MOD 5.5X35 (Screw) ×1 IMPLANT
SHANK RELINE MOD 5.5X40 (Screw) ×2 IMPLANT
SPIKE FLUID TRANSFER (MISCELLANEOUS) ×1 IMPLANT
SPONGE SURGIFOAM ABS GEL 100 (HEMOSTASIS) ×2 IMPLANT
SPONGE T-LAP 4X18 ~~LOC~~+RFID (SPONGE) IMPLANT
STRIP CLOSURE SKIN 1/2X4 (GAUZE/BANDAGES/DRESSINGS) IMPLANT
SUT PROLENE 6 0 BV (SUTURE) IMPLANT
SUT VIC AB 0 CT1 18XCR BRD8 (SUTURE) ×1 IMPLANT
SUT VIC AB 0 CT1 8-18 (SUTURE) ×6
SUT VIC AB 2-0 CT1 18 (SUTURE) ×3 IMPLANT
SUT VIC AB 3-0 SH 8-18 (SUTURE) ×3 IMPLANT
TOWEL GREEN STERILE (TOWEL DISPOSABLE) ×2 IMPLANT
TOWEL GREEN STERILE FF (TOWEL DISPOSABLE) ×2 IMPLANT
TRAY FOLEY MTR SLVR 14FR STAT (SET/KITS/TRAYS/PACK) ×1 IMPLANT
TRAY FOLEY MTR SLVR 16FR STAT (SET/KITS/TRAYS/PACK) ×1 IMPLANT
WATER STERILE IRR 1000ML POUR (IV SOLUTION) ×2 IMPLANT

## 2022-02-26 NOTE — Op Note (Signed)
02/26/2022  6:12 PM  PATIENT:  Shannon [name]  66 y.o. female With lumbar stenosis , spondylolisthesis at the adjacent level. Admitted for lumbar decompression and arthrodesis at L3/4 PRE-OPERATIVE DIAGNOSIS:  L3/4 Spondylolisthesis, Lumbar region  POST-OPERATIVE DIAGNOSIS: L3/4 Spondylolisthesis, Lumbar region  PROCEDURE:  Procedure(s): Lumbar three-four Posterior Lumbar Interbody Fusion, cages packed with local autograft (Stryker) [place]adia  Laminectomy L3 in excess needed exposure for a PLIF at L3/4 Segmental pedicle screw fixation L3-S1 Hardware removal S1, L5, L4 screws  SURGEON:  Surgeon(s): Ashok Pall, MD Newman Pies, MD  ASSISTANTS:Jenkins, Dellis Filbert  ANESTHESIA:   general  EBL:  Total I/O In: 2670 [P.O.:120; I.V.:2300; IV Piggyback:250] Out: 92 [Urine:325; Blood:600]  BLOOD ADMINISTERED:none  CELL SAVER GIVEN:none  COUNT:per nursing  DRAINS: none   SPECIMEN:  No Specimen  DICTATION: Shannon [name] is a 66 y.o. female whom was taken to the operating room intubated, and placed under a general anesthetic without difficulty. A foley catheter was placed under sterile conditions. She was positioned prone on a Jackson table with all pressure points properly padded.  Her lumbar region was prepped and draped in a sterile manner. I infiltrated 0cc's 1/2%lidocaine/1:2000,000 strength epinephrine into the planned incision. I opened the skin with a 10 blade and took the incision down to the thoracolumbar fascia. I exposed the lamina of L2, and L3 in a subperiosteal fashion bilaterally. I confirmed my location with an intraoperative xray.  I placed self retaining retractors and started the decompression.  I decompressed the spinal canal via a complete laminectomy of L3, inferior facetectomies of L3, and partial superior facetectomies of L4. This allowed for a complete thecal sac decompression and L3 , L4 root compression bilaterally. The exposure was well beyond what was need  to expose for a PLIF.  PLIF's were performed at L3/4 in the same fashion. I opened the disc space with a 15 blade then used a variety of instruments to remove the disc and prepare the space for the arthrodesis. I used curettes, rongeurs, punches, shavers for the disc space, and rasps in the discetomy. I measured the disc space and placed 53m x 274mTitanium cages(Cascadia, stryker) into the disc space(s). The cages were packed with local autografts I placed pedicle screws at L3, using fluoroscopic guidance. I drilled a pilot hole, then cannulated the pedicle with a drill at each site. I then tapped each pedicle, assessing each site for pedicle violations. No cutouts were appreciated. I replaced the L4, and L5 Screws on each side with 6.7m36m 53m49mrews(nuvasive) were then placed at each site without difficulty. I attached rods and locking caps with the appropriate tools. The locking caps were secured with torque limited screwdrivers. Final films were performed and the final construct appeared to be in good position.  We closed the wound in a layered fashion. We approximated the thoracolumbar fascia, subcutaneous, and subcuticular planes with vicryl sutures. I used dermabond and an occlusive bandage for a sterile dressing.     PLAN OF CARE: Admit to inpatient   PATIENT DISPOSITION:  PACU - hemodynamically stable.   Delay start of Pharmacological VTE agent (>24hrs) due to surgical blood loss or risk of bleeding:  yes

## 2022-02-26 NOTE — Anesthesia Postprocedure Evaluation (Signed)
Anesthesia Post Note  Patient: Shannon [name]  Procedure(s) Performed: Lumbar three-four Posterior Lumbar Interbody Fusion     Patient location during evaluation: PACU Anesthesia Type: General Level of consciousness: awake and alert Pain management: pain level controlled Vital Signs Assessment: post-procedure vital signs reviewed and stable Respiratory status: spontaneous breathing, nonlabored ventilation, respiratory function stable and patient connected to nasal cannula oxygen Cardiovascular status: blood pressure returned to baseline and stable Postop Assessment: no apparent nausea or vomiting Anesthetic complications: no   No notable events documented.  Last Vitals:  Vitals:   02/26/22 1525 02/26/22 1540  BP: (!) 108/53 (!) 152/87  Pulse: 73 79  Resp: 19 14  Temp:    SpO2: 97% 97%    Last Pain:  Vitals:   02/26/22 1525  TempSrc:   PainSc: [place]

## 2022-02-26 NOTE — Transfer of Care (Signed)
Immediate Anesthesia Transfer of Care Note  Patient: Louann Sjogren  Procedure(s) Performed: Lumbar three-four Posterior Lumbar Interbody Fusion  Patient Location: PACU  Anesthesia Type:General  Level of Consciousness: drowsy and patient cooperative  Airway & Oxygen Therapy: Patient Spontanous Breathing and Patient connected to face mask oxygen  Post-op Assessment: Report given to RN and Post -op Vital signs reviewed and stable  Post vital signs: Reviewed and stable  Last Vitals:  Vitals Value Taken Time  BP 161/77 02/26/22 1421  Temp    Pulse 84 02/26/22 1423  Resp 14 02/26/22 1423  SpO2 96 % 02/26/22 1423  Vitals shown include unvalidated device data.  Last Pain:  Vitals:   02/26/22 0614  TempSrc: Oral  PainSc:          Complications: No notable events documented.

## 2022-02-26 NOTE — Anesthesia Procedure Notes (Signed)
Procedure Name: Intubation Date/Time: 02/26/2022 8:00 AM  Performed by: Lance Coon, CRNAPre-anesthesia Checklist: Patient identified, Emergency Drugs available, Suction available, Patient being monitored and Timeout performed Patient Re-evaluated:Patient Re-evaluated prior to induction Oxygen Delivery Method: Circle system utilized Preoxygenation: Pre-oxygenation with 100% oxygen Induction Type: IV induction Ventilation: Mask ventilation without difficulty Laryngoscope Size: Miller and 3 Grade View: Grade I Tube type: Oral Tube size: 7.0 mm Number of attempts: 1 Airway Equipment and Method: Stylet Placement Confirmation: ETT inserted through vocal cords under direct vision, positive ETCO2 and breath sounds checked- equal and bilateral Secured at: 21 cm Tube secured with: Tape Dental Injury: Teeth and Oropharynx as per pre-operative assessment

## 2022-02-26 NOTE — H&P (Signed)
BP (!) 154/87   Pulse 81   Temp 97.7 F (36.5 C) (Oral)   Resp 18   Ht '5\' 6"'$  (1.676 m)   Wt 125.2 kg   SpO2 95%   BMI 44.55 kg/m  Shannon [name] returns with a myelogram post myelogram CT and those studies.  She has a retrolisthesis at 3-4 and she is stenotic at 3-4.  Some facet hypertrophy.  The disc is maintained, but is bulging a little bit.  I do see bone in between 4-5 and at 5-1 through the cages.  It looks good to me as a fusion.  Also, there is posterolateral bone at 4-5 on the left.  So, while the radiologist says there is no appreciable fusion, I would disagree as it certainly appears to be present through the cages.  We will just go ahead and do another PLIF at 3-4.  She is having classic signs of neurogenic claudication and worsening pain with standing or walking.  She is well-versed in the procedure.    VITAL SIGNS :  She is 5 feet 6 inches, weighs 249 pounds.  Temperature is 97.5, blood pressure 143/85, pulse 87.  Pain is 8/10.     PAST MEDICAL HISTORY :  Includes hypertension and diabetes.     PAST SURGICAL HISTORY :  She has undergone an appendectomy and a hysterectomy.     MEDICATIONS :  She takes Ozempic, Hydrochlorothiazide, Potassium Chloride, Meclizine, Amlodipine, Cholecalciferol, Duloxetine, Gabapentin, and  Ropinirole.     FAMILY HISTORY :  Mother 61, has hypertension.  Father 4, has hypertension and diabetes.     REVIEW OF SYSTEMS :  She says her pain and symptoms started in 2012.  Review of systems is positive for shortness of breath, leg pain with walking, arthritis, neck pain, back pain, arm pain, and leg pain at rest.  She has numbness and tingling in her feet and her big toes.  She does have a history of headaches.     PHYSICAL EXAMINATION :  She is alert and oriented by 4.  She answers all questions appropriately.  Memory, language, attention span, and fund of knowledge are normal.  She has 5/5 strength in the upper and lower extremities.   She does favor the right lower extremity when she walks.  Her gait is antalgic.  She is able to toe walk and heel walk.  Reflexes 2 to 3+ at the right knee, 2+ left knee, 2+ at the ankles, and 2+ biceps, triceps, brachioradialis.  Proprioception is intact in both the upper and lower extremities.  Romberg is negative.  She can toe walk, heel walk, and do a deep knee bend.  Pupils equal, round, and reactive to light.  Full extraocular movements.  Full visual fields.  Hearing intact to voice.  She has symmetric facial movements and sensation.

## 2022-02-27 MED ORDER — CHLORHEXIDINE GLUCONATE CLOTH 2 % EX PADS
6.0000 | MEDICATED_PAD | Freq: Every day | CUTANEOUS | Status: DC
  Administered 2022-02-28 – 2022-03-02 (×3): 6 via TOPICAL

## 2022-02-27 NOTE — Progress Notes (Addendum)
0002 RN discussed with Reinaldo Meeker, NP pt soaked back dressing. She told RN to use rolled gauze with the stretchy tape to create a dressing that would hold pressure.   0630 foley removed per order

## 2022-02-27 NOTE — Progress Notes (Signed)
Had paged Dr. Christella Noa though paging service after PT reports that her right upper lateral thigh felt number than rest of leg. She reports this @ 1850 but states this has been all day. Sensation returns to normal about midline thigh.

## 2022-02-27 NOTE — Progress Notes (Signed)
Patient ID: Shannon [name], female   DOB: [birth year], 66 y.o.   MRN: [id number] BP (!) 158/91 (BP Location: Left Wrist)   Pulse 88   Temp 98 F (36.7 C) (Oral)   Resp 18   Ht '5\' 6"'$  (1.676 m)   Wt 125.2 kg   SpO2 96%   BMI 44.55 kg/m  Alert and oriented x 4, nauseated and emesis earlier today Drinking well, will discontinue the IV, saline lock Moving lower extremities well Continue PT, OT

## 2022-02-27 NOTE — Evaluation (Signed)
Occupational Therapy Evaluation Patient Details Name: Shannon [name] MRN: [id number] DOB: [birth year] Today's Date: 02/27/2022   History of Present Illness Pt is a 66 y.o. female who presented 02/26/22 for L3-4 PLIF, L3 laminectomy, hardware removal S1 L5 L4, segmental pedicle screw fixation L3-S1. PMH: depression, fibromyalgia, HTN, pre-diabetes, sarcoidosis, sciatica   Clinical Impression   Pt was furniture walking and performing ADLs modified independently prior to admission. She presents with back pain and impaired standing balance. Pt requires min guard assist for all mobility and set up to min assist for ADLs. VSS throughout session. She is likely to progress well and not require post acute OT.      Recommendations for follow up therapy are one component of a multi-disciplinary discharge planning process, led by the attending physician.  Recommendations may be updated based on patient status, additional functional criteria and insurance authorization.   Follow Up Recommendations  No OT follow up    Assistance Recommended at Discharge Intermittent Supervision/Assistance  Patient can return home with the following Assist for transportation;Help with stairs or ramp for entrance;Assistance with cooking/housework;A little help with bathing/dressing/bathroom;A little help with walking and/or transfers    Functional Status Assessment  Patient has had a recent decline in their functional status and demonstrates the ability to make significant improvements in function in a reasonable and predictable amount of time.  Equipment Recommendations  None recommended by OT    Recommendations for Other Services       Precautions / Restrictions Precautions Precautions: Fall;Back Precaution Booklet Issued: No Precaution Comments: watch BP (HTN); reveiwed spinal precautions, pt able to state BLT Restrictions Weight Bearing Restrictions: No      Mobility Bed Mobility Overal bed mobility: Needs  Assistance Bed Mobility: Rolling, Sit to Sidelying Rolling: Supervision       Sit to sidelying: Min guard General bed mobility comments: pt self cues for log roll technique    Transfers Overall transfer level: Needs assistance Equipment used: Rolling walker (2 wheels) Transfers: Sit to/from Stand Sit to Stand: Min guard           General transfer comment: from recliner and BSC      Balance Overall balance assessment: Needs assistance   Sitting balance-Leahy Scale: Good       Standing balance-Leahy Scale: Poor Standing balance comment: Reliant on RW                           ADL either performed or assessed with clinical judgement   ADL Overall ADL's : Needs assistance/impaired Eating/Feeding: Independent;Sitting   Grooming: Set up;Sitting;Wash/dry hands   Upper Body Bathing: Minimal assistance;Sitting   Lower Body Bathing: Minimal assistance;Sit to/from stand   Upper Body Dressing : Set up;Sitting   Lower Body Dressing: Minimal assistance;Sit to/from stand   Toilet Transfer: Min guard;Ambulation;Rolling walker (2 wheels);BSC/3in1   Toileting- Clothing Manipulation and Hygiene: Set up;Sitting/lateral lean         General ADL Comments: reviewed IADLs pt is to avoid to adhere to back precautions, pt does not wear socks, uses slip on shoes and will likely wear gowns     Vision Baseline Vision/History: 1 Wears glasses Ability to See in Adequate Light: 0 Adequate Patient Visual Report: No change from baseline       Perception     Praxis      Pertinent Vitals/Pain Pain Assessment Pain Assessment: Faces Faces Pain Scale: Hurts little more Pain Location: back Pain Descriptors / Indicators:  Operative site guarding, Sore Pain Intervention(s): Monitored during session, Premedicated before session, Repositioned     Hand Dominance Right   Extremity/Trunk Assessment Upper Extremity Assessment Upper Extremity Assessment: Overall WFL for  tasks assessed   Lower Extremity Assessment Lower Extremity Assessment: Defer to PT evaluation   Cervical / Trunk Assessment Cervical / Trunk Assessment: Back Surgery   Communication Communication Communication: No difficulties   Cognition Arousal/Alertness: Awake/alert Behavior During Therapy: WFL for tasks assessed/performed Overall Cognitive Status: Within Functional Limits for tasks assessed                                       General Comments       Exercises     Shoulder Instructions      Home Living Family/patient expects to be discharged to:: Private residence Living Arrangements: Spouse/significant other Available Help at Discharge: Family;Neighbor;Available 24 hours/day (neighbor will stay with her when her boyfriend is working) Type of Home: House Home Access: Stairs to enter Technical brewer of Steps: 1 Entrance Stairs-Rails: Left Home Layout: One level     Bathroom Shower/Tub: Teacher, early years/pre: [place]: BSC/3in1;Tub bench;Rolling Environmental consultant (2 wheels);Cane - single point          Prior Functioning/Environment Prior Level of Function : Independent/Modified Independent;Driving             Mobility Comments: Has been holding onto furniture for support after October 2022 spinal surgery, prior to that she was IND without AD. No falls in past 6 months. ADLs Comments: Mod I ofr ADLs. Drives "on and off", wears slip on shoes and pulls leg up on bed to don panties, avoids pants and socks        OT Problem List: Impaired balance (sitting and/or standing);Pain      OT Treatment/Interventions: Self-care/ADL training;DME and/or AE instruction;Therapeutic activities;Patient/family education;Balance training    OT Goals(Current goals can be found in the care plan section) Acute Rehab OT Goals OT Goal Formulation: With patient Time For Goal Achievement: 03/13/22 Potential to Achieve Goals:  Good ADL Goals Pt Will Perform Grooming: with modified independence;standing Pt Will Perform Lower Body Bathing: with modified independence;sit to/from stand;with adaptive equipment Pt Will Perform Lower Body Dressing: with modified independence;sit to/from stand Pt Will Transfer to Toilet: with modified independence;ambulating;bedside commode Pt Will Perform Toileting - Clothing Manipulation and hygiene: with modified independence;sit to/from stand Additional ADL Goal #1: Pt will adhere to back precautions during ADL and mobility.  OT Frequency: Min 2X/week    Co-evaluation              AM-PAC OT "6 Clicks" Daily Activity     Outcome Measure Help from another person eating meals?: None Help from another person taking care of personal grooming?: A Little Help from another person toileting, which includes using toliet, bedpan, or urinal?: A Little Help from another person bathing (including washing, rinsing, drying)?: A Little Help from another person to put on and taking off regular upper body clothing?: None Help from another person to put on and taking off regular lower body clothing?: A Little 6 Click Score: 20   End of Session Equipment Utilized During Treatment: Rolling walker (2 wheels);Gait belt  Activity Tolerance: Patient tolerated treatment well Patient left: in bed;with call bell/phone within reach;with bed alarm set  OT Visit Diagnosis: Unsteadiness on feet (R26.81);Other abnormalities of gait and  mobility (R26.89);Pain                Time: 3295-1884 OT Time Calculation (min): 18 min Charges:  OT General Charges $OT Visit: 1 Visit OT Evaluation $OT Eval Moderate Complexity: [place], OTR/L Acute Rehabilitation Services Office: [phone]  Malka So 02/27/2022, 2:39 PM

## 2022-02-27 NOTE — Evaluation (Signed)
Physical Therapy Evaluation Patient Details Name: Shannon [name] MRN: [id number] DOB: [birth year] Today's Date: 02/27/2022  History of Present Illness  Pt is a 66 y.o. female who presented 02/26/22 for L3-4 PLIF, L3 laminectomy, hardware removal S1 L5 L4, segmental pedicle screw fixation L3-S1. PMH: depression, fibromyalgia, HTN, pre-diabetes, sarcoidosis, sciatica   Clinical Impression  Pt presents with condition above and deficits mentioned below, see PT Problem List. PTA, she was mod I holding onto furniture for support with mobility (was IND without DME prior to spinal surgery October 2022). Pt lives with her boyfriend in a 1-level house with 1 STE. Currently, pt displays some mild functional weakness in her lower extremities with weakness noted primarily in her hip flexors. Pt also displays deficits in activity tolerance and balance. She required min guard-minA for transfers and min guard to ambulate within the room with a RW. Pt limited in further mobility by her BP rising and pt becoming symptomatic, see below, along with her dressing leaking blood. Pt would benefit from further acute PT and HHPT follow-up to maximize her independence and safety with all functional mobility.      BP:  145/70 @ 9:12 (prior to session with pt supine) 159/98 at 9:46 standing 198/109 sitting on commode (bout of emesis, pt felt hot & lightheaded) 192/100 reclined in chair 123/88 after being reclined in chair for several min with improvement in symptoms     Recommendations for follow up therapy are one component of a multi-disciplinary discharge planning process, led by the attending physician.  Recommendations may be updated based on patient status, additional functional criteria and insurance authorization.  Follow Up Recommendations Home health PT      Assistance Recommended at Discharge Intermittent Supervision/Assistance  Patient can return home with the following  A little help with walking and/or  transfers;A little help with bathing/dressing/bathroom;Assistance with cooking/housework;Assist for transportation;Help with stairs or ramp for entrance    Equipment Recommendations None recommended by PT  Recommendations for Other Services       Functional Status Assessment Patient has had a recent decline in their functional status and demonstrates the ability to make significant improvements in function in a reasonable and predictable amount of time.     Precautions / Restrictions Precautions Precautions: Fall;Back Precaution Booklet Issued: No Precaution Comments: watch BP (HTN); reveiwed spinal precautions Required Braces or Orthoses:  (no brace needed) Restrictions Weight Bearing Restrictions: No      Mobility  Bed Mobility Overal bed mobility: Needs Assistance Bed Mobility: Rolling, Sidelying to Sit Rolling: Min guard Sidelying to sit: Min guard, HOB elevated       General bed mobility comments: Cues to log roll and transition legs off EOB when trunk ascends, min guard for safety with extra time to complete.    Transfers Overall transfer level: Needs assistance Equipment used: Rolling walker (2 wheels) Transfers: Sit to/from Stand Sit to Stand: Min assist, Min guard           General transfer comment: Cues provided for hand placement coming to stand from EOB, extra time but min guard for safety. MinA to power up to stand from commode with use of grab bars.    Ambulation/Gait Ambulation/Gait assistance: Min guard Gait Distance (Feet): 20 Feet (x2 bouts of ~20 ft > ~8 ft) Assistive device: Rolling walker (2 wheels) Gait Pattern/deviations: Step-through pattern, Decreased stride length, Wide base of support Gait velocity: reduced Gait velocity interpretation: <1.31 ft/sec, indicative of household ambulator   General Gait Details: Pt with  slow, but fairly steady gait within the room, no LOB, min guard for safety. Limited further mobility secondary to HTN with  pt having bout of emesis and becoming symptomatic.  Stairs            Wheelchair Mobility    Modified Rankin (Stroke Patients Only)       Balance Overall balance assessment: Needs assistance Sitting-balance support: No upper extremity supported, Feet supported Sitting balance-Leahy Scale: Good     Standing balance support: During functional activity, Bilateral upper extremity supported, Reliant on assistive device for balance Standing balance-Leahy Scale: Poor Standing balance comment: Reliant on RW                             Pertinent Vitals/Pain Pain Assessment Pain Assessment: 0-10 Pain Score: 7  Pain Location: back Pain Descriptors / Indicators: Stabbing, Operative site guarding Pain Intervention(s): Limited activity within patient's tolerance, Monitored during session, Repositioned    Home Living Family/patient expects to be discharged to:: Private residence Living Arrangements: Spouse/significant other Available Help at Discharge: Family;Neighbor;Available 24 hours/day (neighbor can be with her when husband is at work) Type of Home: House Home Access: Stairs to enter Entrance Stairs-Rails: Left (ascending) Entrance Stairs-Number of Steps: 1   Home Layout: One level Home Equipment: BSC/3in1;Tub bench;Rolling Walker (2 wheels);Kasandra Knudsen - single point Additional Comments: boyfriend works Architect now    Prior Function Prior Level of Function : Independent/Modified Independent;Driving             Mobility Comments: Has been holding onto furniture for support after October 2022 spinal surgery, prior to that she was IND without AD. No falls in past 6 months. ADLs Comments: Mod I ofr ADLs. Drives "on and off".     Hand Dominance   Dominant Hand: Right    Extremity/Trunk Assessment   Upper Extremity Assessment Upper Extremity Assessment: Defer to OT evaluation    Lower Extremity Assessment Lower Extremity Assessment: RLE  deficits/detail;LLE deficits/detail RLE Deficits / Details: weakness noted with hip flexion AROM (R stronger than L), but MMT scores of 5 for knee extension and ankle dorsiflexion; sensation intact; coordination intact; noted mild functional weakness RLE Sensation: WNL RLE Coordination: WNL LLE Deficits / Details: weakness noted with hip flexion AROM (R stronger than L), but MMT scores of 5 for knee extension and ankle dorsiflexion; sensation intact; coordination intact; noted mild functional weakness LLE Sensation: WNL LLE Coordination: WNL    Cervical / Trunk Assessment Cervical / Trunk Assessment: Back Surgery  Communication   Communication: No difficulties  Cognition Arousal/Alertness: Awake/alert Behavior During Therapy: WFL for tasks assessed/performed Overall Cognitive Status: Within Functional Limits for tasks assessed                                          General Comments General comments (skin integrity, edema, etc.): BP: 145/70 @ 9:12, 159/98 at 9:46 standing, 198/109 sitting on commode (bout of emesis, pt felt hot & lightheaded), 192/100 reclined in chair, 123/88 after being reclined in chair for several min with improvement in symptoms; noted blood on otilet and dripping on floor after using restroom coming from her back dressing, notified RN; SpO2 dropping to 87% on RA intermittently, but rebounds to >/= 93% on RA with cues for deep breathing    Exercises     Assessment/Plan    PT Assessment  Patient needs continued PT services  PT Problem List Decreased strength;Decreased activity tolerance;Decreased balance;Decreased range of motion;Decreased mobility;Cardiopulmonary status limiting activity;Obesity;Pain;Decreased skin integrity       PT Treatment Interventions DME instruction;Gait training;Stair training;Functional mobility training;Therapeutic activities;Therapeutic exercise;Balance training;Neuromuscular re-education;Patient/family education     PT Goals (Current goals can be found in the Care Plan section)  Acute Rehab PT Goals Patient Stated Goal: to get better PT Goal Formulation: With patient Time For Goal Achievement: 03/13/22 Potential to Achieve Goals: Good    Frequency Min 5X/week     Co-evaluation               AM-PAC PT "6 Clicks" Mobility  Outcome Measure Help needed turning from your back to your side while in a flat bed without using bedrails?: A Little Help needed moving from lying on your back to sitting on the side of a flat bed without using bedrails?: A Little Help needed moving to and from a bed to a chair (including a wheelchair)?: A Little Help needed standing up from a chair using your arms (e.g., wheelchair or bedside chair)?: A Little Help needed to walk in hospital room?: A Little Help needed climbing 3-5 steps with a railing? : A Lot 6 Click Score: 17    End of Session Equipment Utilized During Treatment: Gait belt Activity Tolerance: Patient tolerated treatment well;Other (comment) (limited by HTN and bout of emesis) Patient left: in chair;with call bell/phone within reach;with chair alarm set Nurse Communication: Mobility status;Other (comment) (BP, symptoms, blood from dressing) PT Visit Diagnosis: Unsteadiness on feet (R26.81);Other abnormalities of gait and mobility (R26.89);Muscle weakness (generalized) (M62.81);Difficulty in walking, not elsewhere classified (R26.2);Pain Pain - part of body:  (back)    Time: 6579-0383 PT Time Calculation (min) (ACUTE ONLY): 45 min   Charges:   PT Evaluation $PT Eval Moderate Complexity: 1 Mod PT Treatments $Gait Training: 8-22 mins $Therapeutic Activity: 8-22 mins        Moishe Spice, PT, DPT Acute Rehabilitation Services  Office: [phone]   Orvan Falconer 02/27/2022, 11:01 AM

## 2022-02-28 NOTE — Progress Notes (Signed)
Physical Therapy Treatment Patient Details Name: RACHELANNE WHIDBY MRN: [id number] DOB: 02/02/1956 Today's Date: 02/28/2022   History of Present Illness Pt is a 66 y.o. female who presented 02/26/22 for L3-4 PLIF, L3 laminectomy, hardware removal S1 L5 L4, segmental pedicle screw fixation L3-S1. PMH: depression, fibromyalgia, HTN, pre-diabetes, sarcoidosis, sciatica    PT Comments    Pt is making good progress with mobility, performing all functional mobility at a Supervision level today. Pt was also able to tolerate ambulating an increased distance of ~172 ft with her RW today, no LOB. Will continue to follow acutely. Current recommendations remain appropriate.     Recommendations for follow up therapy are one component of a multi-disciplinary discharge planning process, led by the attending physician.  Recommendations may be updated based on patient status, additional functional criteria and insurance authorization.  Follow Up Recommendations  Home health PT     Assistance Recommended at Discharge Intermittent Supervision/Assistance  Patient can return home with the following A little help with bathing/dressing/bathroom;Assistance with cooking/housework;Assist for transportation;Help with stairs or ramp for entrance   Equipment Recommendations  None recommended by PT    Recommendations for Other Services       Precautions / Restrictions Precautions Precautions: Fall;Back Precaution Booklet Issued: Yes (comment) Precaution Comments: watch BP (HTN); reveiwed spinal precautions, able to recall 3/3 Required Braces or Orthoses:  (no brace needed) Restrictions Weight Bearing Restrictions: No     Mobility  Bed Mobility Overal bed mobility: Needs Assistance Bed Mobility: Rolling, Sidelying to Sit Rolling: Supervision Sidelying to sit: Supervision       General bed mobility comments: Supervision for safety with pt using bed rail to assist in log roll and transition to sit EOB  with bed flat.    Transfers Overall transfer level: Needs assistance Equipment used: Rolling walker (2 wheels) Transfers: Sit to/from Stand Sit to Stand: Supervision           General transfer comment: Supervision for safety, extra time to power up to stand from low EOB surface, no LOB.    Ambulation/Gait Ambulation/Gait assistance: Supervision Gait Distance (Feet): 172 Feet Assistive device: Rolling walker (2 wheels) Gait Pattern/deviations: Step-through pattern, Decreased stride length, Wide base of support Gait velocity: reduced Gait velocity interpretation: <1.8 ft/sec, indicate of risk for recurrent falls   General Gait Details: Pt with decreased speed, but steady gait using RW, even when changing head positions. No LOB, supervision for safety.   Stairs             Wheelchair Mobility    Modified Rankin (Stroke Patients Only)       Balance Overall balance assessment: Needs assistance Sitting-balance support: No upper extremity supported, Feet supported Sitting balance-Leahy Scale: Good     Standing balance support: During functional activity, Bilateral upper extremity supported, Reliant on assistive device for balance, Single extremity supported Standing balance-Leahy Scale: Poor Standing balance comment: Reliant on RW                            Cognition Arousal/Alertness: Awake/alert Behavior During Therapy: WFL for tasks assessed/performed Overall Cognitive Status: Within Functional Limits for tasks assessed                                          Exercises      General Comments General comments (skin integrity, edema,  etc.): BP stable, no symptoms/nausea today; poor pleth when ambulating but when reading well it was >/= 92% on RA      Pertinent Vitals/Pain Pain Assessment Pain Assessment: Faces Faces Pain Scale: Hurts little more Pain Location: back Pain Descriptors / Indicators: Stabbing, Operative site  guarding Pain Intervention(s): Limited activity within patient's tolerance, Monitored during session, Repositioned    Home Living                          Prior Function            PT Goals (current goals can now be found in the care plan section) Acute Rehab PT Goals Patient Stated Goal: to go home Sunday PT Goal Formulation: With patient Time For Goal Achievement: 03/13/22 Potential to Achieve Goals: Good Progress towards PT goals: Progressing toward goals    Frequency    Min 5X/week      PT Plan Current plan remains appropriate    Co-evaluation              AM-PAC PT "6 Clicks" Mobility   Outcome Measure  Help needed turning from your back to your side while in a flat bed without using bedrails?: A Little Help needed moving from lying on your back to sitting on the side of a flat bed without using bedrails?: A Little Help needed moving to and from a bed to a chair (including a wheelchair)?: A Little Help needed standing up from a chair using your arms (e.g., wheelchair or bedside chair)?: A Little Help needed to walk in hospital room?: A Little Help needed climbing 3-5 steps with a railing? : A Lot 6 Click Score: 17    End of Session   Activity Tolerance: Patient tolerated treatment well Patient left: with call bell/phone within reach;Other (comment) (on commode with OT entering) Nurse Communication: Mobility status PT Visit Diagnosis: Unsteadiness on feet (R26.81);Other abnormalities of gait and mobility (R26.89);Muscle weakness (generalized) (M62.81);Difficulty in walking, not elsewhere classified (R26.2);Pain Pain - part of body:  (back)     Time: 5638-9373 PT Time Calculation (min) (ACUTE ONLY): 14 min  Charges:  $Gait Training: 8-22 mins                     Moishe Spice, PT, DPT Acute Rehabilitation Services  Office: [phone]    Orvan Falconer 02/28/2022, 12:25 PM

## 2022-02-28 NOTE — Progress Notes (Signed)
Mobility Specialist Progress Note   02/28/22 1442  Mobility  Activity Ambulated with assistance in hallway  Level of Assistance Minimal assist, patient does 75% or more  Assistive Device Front wheel walker  Distance Ambulated (ft) 220 ft  Activity Response Tolerated well  $Mobility charge 1 Mobility   Pre Mobility: 147/82 BP, 96% SpO2 on 2LO2 During Mobility: 91% SpO2 on 2LO2 Post Mobility: 127/86 BP, 95% SpO2 on 2LO2  Patient received in bed on 2LO2 having slight pain in back but agreeable to participate in mobility. Ambulated in hallway w/o complaint with a steady gait maintaining >91% SpO2. Returned to room demonstrating good spinal precautions throughout session. Left in bed with all needs met, call bell in reach.   Holland Falling Mobility Specialist MS [facility] #:  [phone] Acute Rehab Office:  [phone]

## 2022-02-28 NOTE — Care Management Important Message (Signed)
Important Message  Patient Details  Name: Shannon [name] MRN: [id number] Date of Birth: 25-Aug-1956   Medicare Important Message Given:  Yes     Orbie Pyo 02/28/2022, 4:14 PM

## 2022-02-28 NOTE — Progress Notes (Signed)
Occupational Therapy Treatment Patient Details Name: Shannon [name] MRN: [id number] DOB: 02/14/1956 Today's Date: 02/28/2022   History of present illness Pt is a 66 y.o. female who presented 02/26/22 for L3-4 PLIF, L3 laminectomy, hardware removal S1 L5 L4, segmental pedicle screw fixation L3-S1. PMH: depression, fibromyalgia, HTN, pre-diabetes, sarcoidosis, sciatica   OT comments  Pt progressing towards established OT goals and very motivated to participate. Pt performing grooming, LB dressing, toileting, and functional mobility using RW at Supervision level. Continued education on back precautions, grooming, LB ADLs, toilet transfer, and tub transfer with bench; pt demonstrated understanding. Answered all pt questions. Recommend dc home once medically stable per physician. All acute OT needs met and will sign off. Thank you.   Recommendations for follow up therapy are one component of a multi-disciplinary discharge planning process, led by the attending physician.  Recommendations may be updated based on patient status, additional functional criteria and insurance authorization.    Follow Up Recommendations  No OT follow up    Assistance Recommended at Discharge Intermittent Supervision/Assistance  Patient can return home with the following  Assist for transportation;Help with stairs or ramp for entrance;Assistance with cooking/housework;A little help with bathing/dressing/bathroom;A little help with walking and/or transfers   Equipment Recommendations  None recommended by OT    Recommendations for Other Services      Precautions / Restrictions Precautions Precautions: Fall;Back Precaution Booklet Issued: Yes (comment) Precaution Comments: Reviewed back precautions Required Braces or Orthoses:  (no brace needed) Restrictions Weight Bearing Restrictions: No       Mobility Bed Mobility               General bed mobility comments: OOB with PT upon arrival     Transfers Overall transfer level: Needs assistance Equipment used: Rolling walker (2 wheels) Transfers: Sit to/from Stand Sit to Stand: Supervision           General transfer comment: Supervision for safety, extra time to power up to stand from low EOB surface, no LOB.     Balance Overall balance assessment: Needs assistance Sitting-balance support: No upper extremity supported, Feet supported Sitting balance-Leahy Scale: Good     Standing balance support: During functional activity, Reliant on assistive device for balance, No upper extremity supported, Bilateral upper extremity supported Standing balance-Leahy Scale: Fair                             ADL either performed or assessed with clinical judgement   ADL Overall ADL's : Needs assistance/impaired     Grooming: Wash/dry hands;Standing;Supervision/safety;Set up               Lower Body Dressing: Supervision/safety;Set up;Sit to/from stand Lower Body Dressing Details (indicate cue type and reason): Donnning underwear with figure four Toilet Transfer: Ambulation;Supervision/safety;Regular Toilet;Rolling walker (2 wheels);Grab bars   Toileting- Clothing Manipulation and Hygiene: Set up;Sitting/lateral lean     Tub/Shower Transfer Details (indicate cue type and reason): discussed using tub bnech at home Functional mobility during ADLs: Supervision/safety;Rolling walker (2 wheels) General ADL Comments: Reviewing all ADLs and adherance to back precautions. Pt demonstrating performance at Supervision level and very agreeable to education    Extremity/Trunk Assessment Upper Extremity Assessment Upper Extremity Assessment: Overall WFL for tasks assessed   Lower Extremity Assessment Lower Extremity Assessment: Defer to PT evaluation RLE Deficits / Details: weakness noted with hip flexion AROM (R stronger than L), but MMT scores of 5 for knee extension and ankle  dorsiflexion; sensation intact;  coordination intact; noted mild functional weakness RLE Sensation: WNL RLE Coordination: WNL LLE Deficits / Details: weakness noted with hip flexion AROM (R stronger than L), but MMT scores of 5 for knee extension and ankle dorsiflexion; sensation intact; coordination intact; noted mild functional weakness LLE Sensation: WNL LLE Coordination: WNL        Vision       Perception     Praxis      Cognition Arousal/Alertness: Awake/alert Behavior During Therapy: WFL for tasks assessed/performed Overall Cognitive Status: Within Functional Limits for tasks assessed                                 General Comments: Very motivated        Exercises      Shoulder Instructions       General Comments VSS    Pertinent Vitals/ Pain       Pain Assessment Pain Assessment: Faces Faces Pain Scale: Hurts little more Pain Location: back Pain Descriptors / Indicators: Stabbing, Operative site guarding Pain Intervention(s): Monitored during session, Repositioned  Home Living                                          Prior Functioning/Environment              Frequency  Min 2X/week        Progress Toward Goals  OT Goals(current goals can now be found in the care plan section)  Progress towards OT goals: Goals met/education completed, patient discharged from OT  Acute Rehab OT Goals OT Goal Formulation: With patient Time For Goal Achievement: 03/13/22 Potential to Achieve Goals: Good ADL Goals Pt Will Perform Grooming: with modified independence;standing Pt Will Perform Lower Body Bathing: with modified independence;sit to/from stand;with adaptive equipment Pt Will Perform Lower Body Dressing: with modified independence;sit to/from stand Pt Will Transfer to Toilet: with modified independence;ambulating;bedside commode Pt Will Perform Toileting - Clothing Manipulation and hygiene: with modified independence;sit to/from stand Additional  ADL Goal #1: Pt will adhere to back precautions during ADL and mobility.  Plan Discharge plan remains appropriate    Co-evaluation                 AM-PAC OT "6 Clicks" Daily Activity     Outcome Measure   Help from another person eating meals?: None Help from another person taking care of personal grooming?: A Little Help from another person toileting, which includes using toliet, bedpan, or urinal?: A Little Help from another person bathing (including washing, rinsing, drying)?: A Little Help from another person to put on and taking off regular upper body clothing?: None Help from another person to put on and taking off regular lower body clothing?: A Little 6 Click Score: 20    End of Session Equipment Utilized During Treatment: Rolling walker (2 wheels)  OT Visit Diagnosis: Unsteadiness on feet (R26.81);Other abnormalities of gait and mobility (R26.89);Pain   Activity Tolerance Patient tolerated treatment well   Patient Left with call bell/phone within reach;in chair;with chair alarm set   Nurse Communication Mobility status        Time: 1050-1104 OT Time Calculation (min): 14 min  Charges: OT General Charges $OT Visit: 1 Visit OT Treatments $Self Care/Home Management : 8-22 mins  Kimila Papaleo MSOT, OTR/L Acute Rehab  Office: [place] 02/28/2022, 4:34 PM

## 2022-02-28 NOTE — Progress Notes (Signed)
Patient ID: Shannon [name], female   DOB: 03-17-1956, 66 y.o.   MRN: [id number] BP 130/60 (BP Location: Left Wrist)   Pulse 88   Temp 98.1 F (36.7 C)   Resp 15   Ht '5\' 6"'$  (1.676 m)   Wt 125.2 kg   SpO2 94%   BMI 44.55 kg/m  Alert and oriented x 4 No emesis today, nausea resolved Much better day Moving all extremities

## 2022-03-01 MED ORDER — ACETAMINOPHEN 500 MG PO TABS
1000.0000 mg | ORAL_TABLET | Freq: Once | ORAL | Status: AC
  Administered 2022-03-01: 1000 mg via ORAL
  Filled 2022-03-01: qty 2

## 2022-03-01 MED ORDER — CELECOXIB 200 MG PO CAPS
200.0000 mg | ORAL_CAPSULE | Freq: Once | ORAL | Status: AC
  Administered 2022-03-01: 200 mg via ORAL
  Filled 2022-03-01: qty 1

## 2022-03-01 NOTE — Progress Notes (Signed)
Subjective: Patient reports pain well-controlled  Objective: Vital signs in last 24 hours: Temp:  [97.6 F (36.4 C)-98.3 F (36.8 C)] 98.3 F (36.8 C) (07/01 0829) Pulse Rate:  [85-90] 86 (07/01 0829) Resp:  [15-20] 20 (07/01 0829) BP: (118-150)/(60-73) 132/72 (07/01 0829) SpO2:  [90 %-95 %] 94 % (07/01 0829)  Intake/Output from previous day: 06/30 0701 - 07/01 0700 In: 800 [P.O.:800] Out: 1050 [Urine:1050] Intake/Output this shift: No intake/output data recorded. NAD MAEW x 4 Dressing changed, some saturation with serosanguinous drainage.  Lab Results: Recent Labs    02/26/22 1859  WBC 17.7*  HGB 11.8*  HCT 35.1*  PLT 247   BMET Recent Labs    02/26/22 1859  CREATININE 0.87    Studies/Results: No results found.  Assessment/Plan: S/p PLIF - PT/OT - possible discharge Sunday   Vallarie Mare 03/01/2022, 10:42 AM

## 2022-03-01 NOTE — Progress Notes (Signed)
9244- Changed dressing. Moderate amount of discharge.

## 2022-03-01 NOTE — Progress Notes (Signed)
Physical Therapy Treatment Patient Details Name: Shannon [name] MRN: [id number] DOB: [birth year] Today's Date: 03/01/2022   History of Present Illness Pt is a 66 y.o. female who presented 02/26/22 for L3-4 PLIF, L3 laminectomy, hardware removal S1 L5 L4, segmental pedicle screw fixation L3-S1. PMH: depression, fibromyalgia, HTN, pre-diabetes, sarcoidosis, sciatica    PT Comments    Pt received in recliner on 2L O2. O2 removed and pt mobilized on RA. Pt ambulated in hallway 200' with RW then urinated in bathroom prior to sitting EOB. SpO2 86% on RA upon sitting. 2L O2 replaced for recovery to 91%. Pt then transitioned to supine in bed.   She required supervision and increased time to complete all functional mobility.    Recommendations for follow up therapy are one component of a multi-disciplinary discharge planning process, led by the attending physician.  Recommendations may be updated based on patient status, additional functional criteria and insurance authorization.  Follow Up Recommendations  Home health PT     Assistance Recommended at Discharge Intermittent Supervision/Assistance  Patient can return home with the following A little help with bathing/dressing/bathroom;Assistance with cooking/housework;Assist for transportation;Help with stairs or ramp for entrance   Equipment Recommendations  None recommended by PT    Recommendations for Other Services       Precautions / Restrictions Precautions Precautions: Fall;Back Precaution Comments: Pt independently recalled 3/3 back precautions. Restrictions Other Position/Activity Restrictions: no brace     Mobility  Bed Mobility Overal bed mobility: Needs Assistance Bed Mobility: Sit to Sidelying, Rolling Rolling: Modified independent (Device/Increase time)       Sit to sidelying: Supervision General bed mobility comments: +rail, increased time    Transfers Overall transfer level: Needs assistance Equipment used:  Rolling walker (2 wheels) Transfers: Sit to/from Stand Sit to Stand: Supervision           General transfer comment: increased time    Ambulation/Gait Ambulation/Gait assistance: Supervision Gait Distance (Feet): 200 Feet Assistive device: Rolling walker (2 wheels) Gait Pattern/deviations: Step-through pattern, Decreased stride length Gait velocity: decreased Gait velocity interpretation: <1.31 ft/sec, indicative of household ambulator   General Gait Details: steady gait with RW   Stairs             Wheelchair Mobility    Modified Rankin (Stroke Patients Only)       Balance Overall balance assessment: Needs assistance Sitting-balance support: No upper extremity supported, Feet supported Sitting balance-Leahy Scale: Good     Standing balance support: During functional activity, Bilateral upper extremity supported, No upper extremity supported Standing balance-Leahy Scale: Fair                              Cognition Arousal/Alertness: Awake/alert Behavior During Therapy: WFL for tasks assessed/performed Overall Cognitive Status: Within Functional Limits for tasks assessed                                          Exercises      General Comments General comments (skin integrity, edema, etc.): SpO2 in 90s at rest on 2L. Mobilized on RA with desat to 86%. Returned to 2L for recovery to 91%.      Pertinent Vitals/Pain Pain Assessment Pain Assessment: Faces Faces Pain Scale: Hurts a little bit Pain Location: back Pain Descriptors / Indicators: Grimacing, Discomfort Pain Intervention(s): Monitored during session, Repositioned  Home Living                          Prior Function            PT Goals (current goals can now be found in the care plan section) Acute Rehab PT Goals Patient Stated Goal: to go home Sunday Progress towards PT goals: Progressing toward goals    Frequency    Min 5X/week       PT Plan Current plan remains appropriate    Co-evaluation              AM-PAC PT "6 Clicks" Mobility   Outcome Measure  Help needed turning from your back to your side while in a flat bed without using bedrails?: None Help needed moving from lying on your back to sitting on the side of a flat bed without using bedrails?: A Little Help needed moving to and from a bed to a chair (including a wheelchair)?: A Little Help needed standing up from a chair using your arms (e.g., wheelchair or bedside chair)?: A Little Help needed to walk in hospital room?: A Little Help needed climbing 3-5 steps with a railing? : A Lot 6 Click Score: 18    End of Session Equipment Utilized During Treatment: Gait belt Activity Tolerance: Patient tolerated treatment well Patient left: in bed;with call bell/phone within reach Nurse Communication: Mobility status PT Visit Diagnosis: Unsteadiness on feet (R26.81);Other abnormalities of gait and mobility (R26.89);Muscle weakness (generalized) (M62.81);Difficulty in walking, not elsewhere classified (R26.2);Pain     Time: 2956-2130 PT Time Calculation (min) (ACUTE ONLY): 25 min  Charges:  $Gait Training: 23-37 mins                     Lorrin Goodell, [place]  Office # [phone] Pager [phone]    Shannon [name] 03/01/2022, 2:01 PM

## 2022-03-01 NOTE — Progress Notes (Signed)
Pt stable during shift, OOB to BR to void, pain controlled. Pt back incision oozing and pt's dsg was saturated on the bed pad this am. Dr. Marcello Moores was notified of the drainage this am. Pt's dsg was changed and new dsg applied. Reported off to oncoming RN. Delia Heady RN

## 2022-03-02 MED ORDER — OXYCODONE-ACETAMINOPHEN 5-325 MG PO TABS: 1.0000 | ORAL_TABLET | Freq: Four times a day (QID) | ORAL | 0 refills | Status: DC | PRN

## 2022-03-02 MED ORDER — DOCUSATE SODIUM 100 MG PO CAPS
100.0000 mg | ORAL_CAPSULE | Freq: Two times a day (BID) | ORAL | 2 refills | Status: DC
Start: 2022-03-02 — End: 2022-09-08

## 2022-03-02 NOTE — Discharge Summary (Signed)
Physician Discharge Summary  Patient ID: ANJELA CASSARA MRN: [id number] DOB/AGE: 02/02/1956 66 y.o.  Admit date: 02/26/2022 Discharge date: 03/02/2022  Admission Diagnoses:  Lumbar spondylolisthesis  Discharge Diagnoses:  Same Principal Problem:   Lumbar adjacent segment disease with spondylolisthesis   Discharged Condition: Stable  Hospital Course:  Shannon [name] is a 66 y.o. female who underwent elective L3-4 PLIF with Dr. Christella Noa.  Postoperatively, she was mobilized with the aid of PT/OT. She was deemed ready for discharge.  She was tolerating a diet, pain was well-controlled with PO medications.  Treatments: Surgery - L3-4 PLIF  Discharge Exam: Blood pressure 140/73, pulse 83, temperature 98.2 F (36.8 C), temperature source Oral, resp. rate 20, height '5\' 6"'$  (1.676 m), weight 125.2 kg, SpO2 93 %. Awake, alert, oriented Speech fluent, appropriate CN grossly intact 5/5 BUE/BLE Wound c/d/i  Disposition: Discharge disposition: 01-Home or Self Care        Allergies as of 03/02/2022       Reactions   Propoxyphene N-acetaminophen Nausea And Vomiting   Darvocet-N   Prednisone Rash        Medication List     TAKE these medications    acetaminophen 650 MG CR tablet Commonly known as: TYLENOL Take 1,300 mg by mouth every 8 (eight) hours as needed for pain.   amLODipine 5 MG tablet Commonly known as: NORVASC Take 5 mg by mouth daily at 6 (six) AM.   aspirin EC 81 MG tablet Take 81 mg by mouth daily.   docusate sodium 100 MG capsule Commonly known as: Colace Take 1 capsule (100 mg total) by mouth 2 (two) times daily.   DULoxetine 20 MG capsule Commonly known as: CYMBALTA Take 20 mg by mouth 2 (two) times daily.   fluticasone 50 MCG/ACT nasal spray Commonly known as: FLONASE Place 1 spray into both nostrils daily as needed for allergies or rhinitis.   gabapentin 300 MG capsule Commonly known as: NEURONTIN Take 300 mg by mouth at bedtime.    HAIR/SKIN/NAILS PO Take 3 capsules by mouth daily.   hydrochlorothiazide 12.5 MG capsule Commonly known as: MICROZIDE Take 12.5 mg by mouth in the morning.   losartan 100 MG tablet Commonly known as: COZAAR Take 100 mg by mouth every morning.   oxyCODONE-acetaminophen 5-325 MG tablet Commonly known as: Percocet Take 1-2 tablets by mouth every 6 (six) hours as needed for severe pain.   pantoprazole 20 MG tablet Commonly known as: PROTONIX Take 1 tablet (20 mg total) by mouth daily. What changed: when to take this   potassium chloride SA 20 MEQ tablet Commonly known as: KLOR-CON M Take 20 mEq by mouth every evening.   rOPINIRole 0.5 MG tablet Commonly known as: REQUIP Take 0.5 mg by mouth at bedtime.   Semaglutide (1 MG/DOSE) 4 MG/3ML Sopn Inject 1 mg as directed once a week.   tiZANidine 4 MG tablet Commonly known as: ZANAFLEX Take 1 tablet (4 mg total) by mouth every 6 (six) hours as needed for muscle spasms. What changed: when to take this   Vitamin D 125 MCG (5000 UT) Caps Take 5,000 Units by mouth in the morning.        Follow-up Information     Ashok Pall, MD Follow up in 10 day(s).   Specialty: Neurosurgery Contact information: [address] [place] [address] [place] [address] [phone]                 Signed: Vallarie Mare 03/02/2022, 9:39 AM

## 2022-03-02 NOTE — Progress Notes (Signed)
Pt verbalized understanding of d/c instructions. D/c home with husband.

## 2022-03-02 NOTE — Progress Notes (Signed)
Physical Therapy Treatment Patient Details Name: Shannon [name] MRN: [id number] DOB: 1956-05-11 Today's Date: 03/02/2022   History of Present Illness Pt is a 66 y.o. female who presented 02/26/22 for L3-4 PLIF, L3 laminectomy, hardware removal S1 L5 L4, segmental pedicle screw fixation L3-S1. PMH: depression, fibromyalgia, HTN, pre-diabetes, sarcoidosis, sciatica    PT Comments    Patient progressing well and today able to ambulate on RA without drop in SpO2 and managed to practice/simulate one step for home entry and discussed car transfer into large pick up truck.  Feel follow up HHPT remains appropriate.  Note plans for d/c home today.    Recommendations for follow up therapy are one component of a multi-disciplinary discharge planning process, led by the attending physician.  Recommendations may be updated based on patient status, additional functional criteria and insurance authorization.  Follow Up Recommendations  Home health PT     Assistance Recommended at Discharge Intermittent Supervision/Assistance  Patient can return home with the following A little help with bathing/dressing/bathroom;Assistance with cooking/housework;Assist for transportation;Help with stairs or ramp for entrance   Equipment Recommendations  None recommended by PT    Recommendations for Other Services       Precautions / Restrictions Precautions Precautions: Back Precaution Comments: Pt independently recalled 3/3 back precautions. Restrictions Other Position/Activity Restrictions: no brace     Mobility  Bed Mobility Overal bed mobility: Needs Assistance Bed Mobility: Rolling, Sidelying to Sit Rolling: Modified independent (Device/Increase time) Sidelying to sit: Modified independent (Device/Increase time)       General bed mobility comments: use of rail with good technique, increased time as started off laying on other side    Transfers Overall transfer level: Needs assistance Equipment  used: Rolling walker (2 wheels) Transfers: Sit to/from Stand Sit to Stand: Supervision           General transfer comment: stood from EOB and from 3:1 over toilet without physical help    Ambulation/Gait Ambulation/Gait assistance: Supervision Gait Distance (Feet): 260 Feet Assistive device: Rolling walker (2 wheels) Gait Pattern/deviations: Step-through pattern, Decreased stride length       General Gait Details: very slow pace as keeping L hand straight on walker for O2 sat measurement with ambulation   Stairs Stairs: Yes Stairs assistance: Supervision Stair Management: With walker Number of Stairs: 1 (simulated) General stair comments: simulated one step up in hallway with walker with cues   Wheelchair Mobility    Modified Rankin (Stroke Patients Only)       Balance Overall balance assessment: Needs assistance   Sitting balance-Leahy Scale: Good       Standing balance-Leahy Scale: Good Standing balance comment: washing hands at sink with S                            Cognition Arousal/Alertness: Awake/alert Behavior During Therapy: WFL for tasks assessed/performed Overall Cognitive Status: Within Functional Limits for tasks assessed                                          Exercises      General Comments General comments (skin integrity, edema, etc.): on RA throughout SpO2 90-91% throughout with ambulation; verbally educated on techniqu for getting into truck with running board stepping up backwards with walker and handle on truck with assistance      Pertinent Vitals/Pain Pain Assessment  Faces Pain Scale: Hurts a little bit Pain Location: back Pain Descriptors / Indicators: Grimacing, Discomfort Pain Intervention(s): Monitored during session    Home Living                          Prior Function            PT Goals (current goals can now be found in the care plan section) Progress towards PT goals:  Progressing toward goals    Frequency    Min 5X/week      PT Plan Current plan remains appropriate    Co-evaluation              AM-PAC PT "6 Clicks" Mobility   Outcome Measure  Help needed turning from your back to your side while in a flat bed without using bedrails?: None Help needed moving from lying on your back to sitting on the side of a flat bed without using bedrails?: None Help needed moving to and from a bed to a chair (including a wheelchair)?: A Little Help needed standing up from a chair using your arms (e.g., wheelchair or bedside chair)?: A Little Help needed to walk in hospital room?: A Little Help needed climbing 3-5 steps with a railing? : Total 6 Click Score: 18    End of Session Equipment Utilized During Treatment: Gait belt Activity Tolerance: Patient tolerated treatment well Patient left: in chair;with call bell/phone within reach   PT Visit Diagnosis: Unsteadiness on feet (R26.81);Other abnormalities of gait and mobility (R26.89);Muscle weakness (generalized) (M62.81);Difficulty in walking, not elsewhere classified (R26.2);Pain     Time: 1030-1055 PT Time Calculation (min) (ACUTE ONLY): 25 min  Charges:  $Gait Training: 8-22 mins $Therapeutic Activity: 8-22 mins                     Shannon [name], PT Acute Rehabilitation Services ZDGUY:[phone] Office:[phone] 03/02/2022    Shannon [name] 03/02/2022, 12:16 PM

## 2022-03-02 NOTE — Plan of Care (Signed)

## 2022-03-02 NOTE — Discharge Instructions (Signed)
Can shower in 24 hours Apply gauze dressing as needed for drainage Walk as much as possible No heavy lifting >10 lbs No excessive bending/twisting at the waist

## 2022-04-09 ENCOUNTER — Encounter (INDEPENDENT_AMBULATORY_CARE_PROVIDER_SITE_OTHER): Payer: Self-pay

## 2022-04-25 ENCOUNTER — Other Ambulatory Visit: Payer: Self-pay | Admitting: Nurse Practitioner

## 2022-04-25 DIAGNOSIS — Z1231 Encounter for screening mammogram for malignant neoplasm of breast: Secondary | ICD-10-CM

## 2022-05-02 ENCOUNTER — Other Ambulatory Visit: Payer: Self-pay

## 2022-05-02 ENCOUNTER — Emergency Department (HOSPITAL_COMMUNITY): Payer: Medicare Other

## 2022-05-02 ENCOUNTER — Emergency Department (HOSPITAL_COMMUNITY)
Admission: EM | Admit: 2022-05-02 | Discharge: 2022-05-02 | Disposition: A | Discharge status: Home / Self Care | Payer: Medicare Other | Attending: Emergency Medicine | Admitting: Emergency Medicine

## 2022-05-02 ENCOUNTER — Encounter (HOSPITAL_COMMUNITY): Payer: Self-pay

## 2022-05-02 DIAGNOSIS — I10 Essential (primary) hypertension: Secondary | ICD-10-CM | POA: Diagnosis not present

## 2022-05-02 DIAGNOSIS — Z7982 Long term (current) use of aspirin: Secondary | ICD-10-CM | POA: Insufficient documentation

## 2022-05-02 DIAGNOSIS — M546 Pain in thoracic spine: Secondary | ICD-10-CM | POA: Diagnosis not present

## 2022-05-02 DIAGNOSIS — Z79899 Other long term (current) drug therapy: Secondary | ICD-10-CM | POA: Insufficient documentation

## 2022-05-02 DIAGNOSIS — K429 Umbilical hernia without obstruction or gangrene: Secondary | ICD-10-CM | POA: Insufficient documentation

## 2022-05-02 LAB — CBC WITH DIFFERENTIAL/PLATELET
Abs Immature Granulocytes: 0.01 10*3/uL (ref 0.00–0.07)
Basophils Absolute: 0.1 10*3/uL (ref 0.0–0.1)
Basophils Relative: 1 %
Eosinophils Absolute: 0.2 10*3/uL (ref 0.0–0.5)
Eosinophils Relative: 2 %
HCT: 36.1 % (ref 36.0–46.0)
Hemoglobin: 11.2 g/dL — ABNORMAL LOW (ref 12.0–15.0)
Immature Granulocytes: 0 %
Lymphocytes Relative: 24 %
Lymphs Abs: 2 10*3/uL (ref 0.7–4.0)
MCH: 25.5 pg — ABNORMAL LOW (ref 26.0–34.0)
MCHC: 31 g/dL (ref 30.0–36.0)
MCV: 82 fL (ref 80.0–100.0)
Monocytes Absolute: 0.4 10*3/uL (ref 0.1–1.0)
Monocytes Relative: 5 %
Neutro Abs: 5.7 10*3/uL (ref 1.7–7.7)
Neutrophils Relative %: 68 %
Platelets: 286 10*3/uL (ref 150–400)
RBC: 4.4 MIL/uL (ref 3.87–5.11)
RDW: 14.2 % (ref 11.5–15.5)
WBC: 8.4 10*3/uL (ref 4.0–10.5)
nRBC: 0 % (ref 0.0–0.2)

## 2022-05-02 LAB — URINALYSIS, ROUTINE W REFLEX MICROSCOPIC
Bilirubin Urine: NEGATIVE
Glucose, UA: NEGATIVE mg/dL
Ketones, ur: NEGATIVE mg/dL
Leukocytes,Ua: NEGATIVE
Nitrite: NEGATIVE
Protein, ur: NEGATIVE mg/dL
Specific Gravity, Urine: 1.006 (ref 1.005–1.030)
pH: 7 (ref 5.0–8.0)

## 2022-05-02 LAB — COMPREHENSIVE METABOLIC PANEL
ALT: 15 U/L (ref 0–44)
AST: 17 U/L (ref 15–41)
Albumin: 3.9 g/dL (ref 3.5–5.0)
Alkaline Phosphatase: 77 U/L (ref 38–126)
Anion gap: 5 (ref 5–15)
BUN: 15 mg/dL (ref 8–23)
CO2: 27 mmol/L (ref 22–32)
Calcium: 9.6 mg/dL (ref 8.9–10.3)
Chloride: 109 mmol/L (ref 98–111)
Creatinine, Ser: 0.78 mg/dL (ref 0.44–1.00)
GFR, Estimated: >60 mL/min (ref >60)
Glucose, Bld: 112 mg/dL — ABNORMAL HIGH (ref 70–99)
Potassium: 3.6 mmol/L (ref 3.5–5.1)
Sodium: 141 mmol/L (ref 135–145)
Total Bilirubin: 0.5 mg/dL (ref 0.3–1.2)
Total Protein: 7.9 g/dL (ref 6.5–8.1)

## 2022-05-02 LAB — LIPASE, BLOOD: Lipase: 40 U/L (ref 11–51)

## 2022-05-02 MED ORDER — CYCLOBENZAPRINE HCL 10 MG PO TABS: 10.0000 mg | ORAL_TABLET | Freq: Two times a day (BID) | ORAL | 0 refills | Status: DC | PRN

## 2022-05-02 MED ORDER — ONDANSETRON HCL 4 MG/2ML IJ SOLN
4.0000 mg | Freq: Once | INTRAMUSCULAR | Status: AC
  Administered 2022-05-02: 4 mg via INTRAVENOUS
  Filled 2022-05-02: qty 2

## 2022-05-02 MED ORDER — MORPHINE SULFATE (PF) 4 MG/ML IV SOLN
4.0000 mg | Freq: Once | INTRAVENOUS | Status: AC
  Administered 2022-05-02: 4 mg via INTRAVENOUS
  Filled 2022-05-02: qty 1

## 2022-05-02 MED ORDER — NAPROXEN 375 MG PO TABS: 375.0000 mg | ORAL_TABLET | Freq: Two times a day (BID) | ORAL | 0 refills | Status: DC

## 2022-05-02 NOTE — ED Triage Notes (Signed)
Patient reports that she had back surgery 01/2022 and has been having "left lower back pain" x 1 1/2 weeks. Patient states it feels like a "big cramp." And is worse when she is lying down. Patient states she has to have help to get OOB. Patient also reports that the pain radiates into the left leg.

## 2022-05-02 NOTE — ED Provider Notes (Signed)
[facility] DEPT Provider Note   CSN: [id number] Arrival date & time: 05/02/22  8502     History  Chief Complaint  Patient presents with   Back Pain    Shannon [name] is a 66 y.o. female.   Back Pain    Patient has history of hypertension sciatica fibromyalgia, back pain status post previous back surgery.  She has also had history of tubal ligation appendectomy and hysterectomy.  Patient presents ED with complaints of pain in her left flank area.  Patient states symptoms ongoing for over a week.  Its like a cramping sharp pain.  It does hurt moving certain positions.  She has not had any trouble with vomiting or diarrhea.  No dysuria.  Home Medications Prior to Admission medications   Medication Sig Start Date End Date Taking? Authorizing Provider  acetaminophen (TYLENOL) 650 MG CR tablet Take 1,300 mg by mouth every 8 (eight) hours as needed for pain.   Yes [provider]  amLODipine (NORVASC) 5 MG tablet Take 5 mg by mouth daily at 6 (six) AM.   Yes [provider]  aspirin EC 81 MG tablet Take 81 mg by mouth daily.   Yes [provider]  Cholecalciferol (VITAMIN D) 125 MCG (5000 UT) CAPS Take 5,000 Units by mouth in the morning.   Yes [provider]  cyclobenzaprine (FLEXERIL) 10 MG tablet Take 1 tablet (10 mg total) by mouth 2 (two) times daily as needed for muscle spasms. 05/02/22  Yes Dorie Rank, MD  docusate sodium (COLACE) 100 MG capsule Take 1 capsule (100 mg total) by mouth 2 (two) times daily. 03/02/22 03/02/23 Yes Vallarie Mare, MD  DULoxetine (CYMBALTA) 20 MG capsule Take 20 mg by mouth 2 (two) times daily. 09/21/17  Yes [provider]  fluticasone (FLONASE) 50 MCG/ACT nasal spray Place 1 spray into both nostrils daily as needed for allergies or rhinitis.   Yes [provider]  gabapentin (NEURONTIN) 300 MG capsule Take 300 mg by mouth at bedtime.   Yes [provider]   hydrochlorothiazide (MICROZIDE) 12.5 MG capsule Take 12.5 mg by mouth in the morning.   Yes [provider]  losartan (COZAAR) 100 MG tablet Take 100 mg by mouth every morning. 10/22/20  Yes [provider]  naproxen (NAPROSYN) 375 MG tablet Take 1 tablet (375 mg total) by mouth 2 (two) times daily. 05/02/22  Yes Dorie Rank, MD  oxyCODONE-acetaminophen (PERCOCET) 5-325 MG tablet Take 1-2 tablets by mouth every 6 (six) hours as needed for severe pain. 03/02/22 03/02/23 Yes Vallarie Mare, MD  pantoprazole (PROTONIX) 20 MG tablet Take 1 tablet (20 mg total) by mouth daily. Patient taking differently: Take 20 mg by mouth in the morning. 11/18/16  Yes Pfeiffer, Jeannie Done, MD  potassium chloride SA (KLOR-CON) 20 MEQ tablet Take 20 mEq by mouth every evening. 10/22/20  Yes [provider]  psyllium (METAMUCIL) 58.6 % powder Take 1 packet by mouth daily.   Yes [provider]  rOPINIRole (REQUIP) 0.5 MG tablet Take 0.5 mg by mouth at bedtime. 06/03/15  Yes [provider]  rosuvastatin (CRESTOR) 10 MG tablet Take 10 mg by mouth daily. 05/02/22  Yes [provider]  Semaglutide, 1 MG/DOSE, 4 MG/3ML SOPN Inject 1 mg as directed once a week. Patient taking differently: Inject 0.5 mg as directed once a week. Sunday 06/06/21  Yes Danford, Berna Spare, NP      Allergies    Propoxyphene n-acetaminophen  and Prednisone    Review of Systems   Review of Systems  Musculoskeletal:  Positive for back pain.    Physical Exam Updated Vital Signs BP (!) 176/100 (BP Location: Left Arm)   Pulse 86   Temp 98.6 F (37 C) (Oral)   Resp 16   Ht 1.676 m ('5\' 6"'$ )   Wt 122.5 kg   SpO2 100%   BMI 43.58 kg/m  Physical Exam Vitals and nursing note reviewed.  Constitutional:      General: She is not in acute distress.    Appearance: She is well-developed.  HENT:     Head: Normocephalic and atraumatic.     Right Ear: External ear normal.     Left Ear: External ear normal.   Eyes:     General: No scleral icterus.       Right eye: No discharge.        Left eye: No discharge.     Conjunctiva/sclera: Conjunctivae normal.  Neck:     Trachea: No tracheal deviation.  Cardiovascular:     Rate and Rhythm: Normal rate.  Pulmonary:     Effort: Pulmonary effort is normal. No respiratory distress.     Breath sounds: No stridor.  Abdominal:     General: There is no distension.     Tenderness: There is abdominal tenderness. There is left CVA tenderness.     Comments: Tenderness epigastric and left upper quadrant  Musculoskeletal:        General: No swelling or deformity.     Cervical back: Neck supple.     Comments: No midline spinal tenderness  Skin:    General: Skin is warm and dry.     Findings: No rash.  Neurological:     Mental Status: She is alert.     Cranial Nerves: Cranial nerve deficit: no gross deficits.     ED Results / Procedures / Treatments   Labs (all labs ordered are listed, but only abnormal results are displayed) Labs Reviewed  COMPREHENSIVE METABOLIC PANEL - Abnormal; Notable for the following components:      Result Value   Glucose, Bld 112 (*)    All other components within normal limits  CBC WITH DIFFERENTIAL/PLATELET - Abnormal; Notable for the following components:   Hemoglobin 11.2 (*)    MCH 25.5 (*)    All other components within normal limits  URINALYSIS, ROUTINE W REFLEX MICROSCOPIC - Abnormal; Notable for the following components:   Color, Urine STRAW (*)    Hgb urine dipstick SMALL (*)    Bacteria, UA RARE (*)    All other components within normal limits  LIPASE, BLOOD    EKG None  Radiology CT Renal Stone Study  Result Date: 05/02/2022 CLINICAL DATA:  Flank pain.  Kidney stones suspected. EXAM: CT ABDOMEN AND PELVIS WITHOUT CONTRAST TECHNIQUE: Multidetector CT imaging of the abdomen and pelvis was performed following the standard protocol without IV contrast. RADIATION DOSE REDUCTION: This exam was performed  according to the departmental dose-optimization program which includes automated exposure control, adjustment of the mA and/or kV according to patient size and/or use of iterative reconstruction technique. COMPARISON:  10/28/2017 FINDINGS: Lower chest: Unremarkable. Hepatobiliary: No suspicious focal abnormality in the liver on this study without intravenous contrast. There is no evidence for gallstones, gallbladder wall thickening, or pericholecystic fluid. No intrahepatic or extrahepatic biliary dilation. Pancreas: No focal mass lesion. No dilatation of the main duct. No intraparenchymal cyst. No peripancreatic edema. Spleen: No splenomegaly. No focal  mass lesion. Adrenals/Urinary Tract: Right adrenal gland shows nodular thickening. Left adrenal adenomas are stable in the interval. No stones are seen in either kidney or ureter. No bladder stones. No secondary changes in either kidney or ureter. Stomach/Bowel: Stomach is unremarkable. No gastric wall thickening. No evidence of outlet obstruction. Duodenum is normally positioned as is the ligament of Treitz. No small bowel wall thickening. No small bowel dilatation. The terminal ileum is normal. The appendix is not well visualized, but there is no edema or inflammation in the region of the cecum. No gross colonic mass. No colonic wall thickening. Vascular/Lymphatic: There is mild atherosclerotic calcification of the abdominal aorta without aneurysm. There is no gastrohepatic or hepatoduodenal ligament lymphadenopathy. No retroperitoneal or mesenteric lymphadenopathy. No pelvic sidewall lymphadenopathy. Reproductive: The uterus is surgically absent. There is no adnexal mass. Other: No intraperitoneal free fluid. Musculoskeletal: Tiny supraumbilical midline ventral hernia contains only fat. No worrisome lytic or sclerotic osseous abnormality. Lumbosacral fusion hardware evident. IMPRESSION: 1. No acute findings in the abdomen or pelvis. Specifically, no findings to  explain the patient's history of flank pain. 2. Stable left adrenal adenomas. 3. Tiny supraumbilical midline ventral hernia contains only fat. 4. Aortic Atherosclerosis (ICD10-I70.0). Electronically Signed   By: Misty Stanley M.D.   On: 05/02/2022 12:10    Procedures Procedures    Medications Ordered in ED Medications  morphine (PF) 4 MG/ML injection 4 mg (4 mg Intravenous Given 05/02/22 1024)  ondansetron (ZOFRAN) injection 4 mg (4 mg Intravenous Given 05/02/22 1024)    ED Course/ Medical Decision Making/ A&P Clinical Course as of 05/02/22 1240  Fri May 02, 2022  1143 CBC with Diff(!) Hemoglobin slightly decreased [JK]  1144 Urinalysis, Routine w reflex microscopic(!) Urinalysis does show small amount of hemoglobin [JK]  1144 Comprehensive metabolic panel(!) No significant abnormalities [JK]  1144 Lipase, blood Normal [JK]  1217 CT scan without acute findings.  Stable adrenal adenomas noted. [JK]  1696 Supraumbilical ventral hernia noted [JK]    Clinical Course User Index [JK] Dorie Rank, MD                           Medical Decision Making Problems Addressed: Acute left-sided thoracic back pain: acute illness or injury that poses a threat to life or bodily functions Essential hypertension: chronic illness or injury with exacerbation, progression, or side effects of treatment  Amount and/or Complexity of Data Reviewed Labs: ordered. Decision-making details documented in ED Course. Radiology: ordered and independent interpretation performed.  Risk Prescription drug management.   Patient presented to the ED with complaints of left-sided back pain.  Abdominal exam is otherwise benign.  She did have some slight tenderness.  LFTs lipase are unremarkable.  No findings to suggest hepatitis or pancreatitis.  Patient did have small amount of hemoglobin in her urine and I was concerned about the possibility of a ureteral stone.  CT scan was performed and fortunately no acute findings  noted.  Incidental umbilical hernia and adrenal adenomas noted.  I did discuss these findings with the patient.  No further follow-up on that is necessary.  No acute abnormalities noted on her work-up today.  Suspect symptoms are most likely musculoskeletal in nature.  Will discharge home with medications for pain and discomfort.  She does have plans to follow-up with her spine doctor later this month  Evaluation and diagnostic testing in the emergency department does not suggest an emergent condition requiring admission or immediate intervention beyond  what has been performed at this time.  The patient is safe for discharge and has been instructed to return immediately for worsening symptoms, change in symptoms or any other concerns.         Final Clinical Impression(s) / ED Diagnoses Final diagnoses:  Essential hypertension  Acute left-sided thoracic back pain    Rx / DC Orders ED Discharge Orders          Ordered    cyclobenzaprine (FLEXERIL) 10 MG tablet  2 times daily PRN        05/02/22 1237    naproxen (NAPROSYN) 375 MG tablet  2 times daily        05/02/22 1237              Dorie Rank, MD 05/02/22 1240

## 2022-05-02 NOTE — Discharge Instructions (Signed)
Blood tests and CT scans did not show any acute abnormalities today.  Take the medications as needed for pain and discomfort.  Follow-up with your primary doctor or spine doctor to be rechecked if the symptoms have not resolved in the next week or 2

## 2022-05-16 ENCOUNTER — Ambulatory Visit
Admission: RE | Admit: 2022-05-16 | Discharge: 2022-05-16 | Disposition: A | Discharge status: Home / Self Care | Payer: Medicare Other | Source: Ambulatory Visit | Attending: Nurse Practitioner | Admitting: Nurse Practitioner

## 2022-05-16 DIAGNOSIS — Z1231 Encounter for screening mammogram for malignant neoplasm of breast: Secondary | ICD-10-CM

## 2022-08-27 ENCOUNTER — Other Ambulatory Visit (HOSPITAL_COMMUNITY): Payer: Self-pay | Admitting: Neurosurgery

## 2022-08-27 ENCOUNTER — Other Ambulatory Visit: Payer: Self-pay | Admitting: Neurosurgery

## 2022-08-27 DIAGNOSIS — M4317 Spondylolisthesis, lumbosacral region: Secondary | ICD-10-CM

## 2022-09-10 ENCOUNTER — Ambulatory Visit (HOSPITAL_COMMUNITY)
Admission: RE | Admit: 2022-09-10 | Discharge: 2022-09-10 | Disposition: A | Discharge status: Home / Self Care | Payer: 59 | Source: Ambulatory Visit | Attending: Neurosurgery | Admitting: Neurosurgery

## 2022-09-10 DIAGNOSIS — M4317 Spondylolisthesis, lumbosacral region: Secondary | ICD-10-CM | POA: Diagnosis not present

## 2022-09-10 DIAGNOSIS — M48061 Spinal stenosis, lumbar region without neurogenic claudication: Secondary | ICD-10-CM | POA: Diagnosis not present

## 2022-09-10 DIAGNOSIS — Z981 Arthrodesis status: Secondary | ICD-10-CM | POA: Insufficient documentation

## 2022-09-10 DIAGNOSIS — M5416 Radiculopathy, lumbar region: Secondary | ICD-10-CM | POA: Insufficient documentation

## 2022-09-10 LAB — GLUCOSE, CAPILLARY
Glucose-Capillary: 143 mg/dL — ABNORMAL HIGH (ref 70–99)
Glucose-Capillary: 105 mg/dL — ABNORMAL HIGH (ref 70–99)

## 2022-09-10 MED ORDER — ONDANSETRON HCL 4 MG/2ML IJ SOLN: 4.0000 mg | Freq: Four times a day (QID) | INTRAMUSCULAR | Status: DC | PRN

## 2022-09-10 MED ORDER — LIDOCAINE HCL (PF) 1 % IJ SOLN
5.0000 mL | Freq: Once | INTRAMUSCULAR | Status: AC
  Administered 2022-09-10: 5 mL via INTRADERMAL

## 2022-09-10 MED ORDER — DIAZEPAM 5 MG PO TABS
10.0000 mg | ORAL_TABLET | Freq: Once | ORAL | Status: AC
  Administered 2022-09-10: 10 mg via ORAL
  Filled 2022-09-10: qty 2

## 2022-09-10 MED ORDER — IOHEXOL 180 MG/ML  SOLN
20.0000 mL | Freq: Once | INTRAMUSCULAR | Status: AC | PRN
  Administered 2022-09-10: 20 mL via INTRATHECAL

## 2022-09-10 MED ORDER — HYDROCODONE-ACETAMINOPHEN 5-325 MG PO TABS: 1.0000 | ORAL_TABLET | ORAL | Status: DC | PRN

## 2022-09-10 NOTE — Op Note (Signed)
09/10/2022 lumbar Myelogram  PATIENT:  Shannon [name] is a 67 y.o. female  PRE-OPERATIVE DIAGNOSIS:  lumbago with radiculopathy  POST-OPERATIVE DIAGNOSIS:  same  PROCEDURE:  Lumbar Myelogram  SURGEON:  Escarlet Saathoff  ANESTHESIA:   local LOCAL MEDICATIONS USED:  LIDOCAINE  Procedure Note: AXELLE SZWED is a 67 y.o. female Was taken to the fluoroscopy suite and  positioned prone on the fluoroscopy table. Her back was prepared and draped in a sterile manner. I infiltrated cc into the lumbar region. I then introduced a spinal needle into the thecal sac at the L4/5 interlaminar space. I infiltrated 20cc of Isovue 180 into the thecal sac. Fluoroscopy showed the needle and contrast in the thecal sac. Louann Sjogren tolerated the procedure well. she Will be taken to CT for evaluation.     PATIENT DISPOSITION:  Short Stay

## 2022-09-10 NOTE — Discharge Instructions (Addendum)
Myelogram and Lumbar Puncture Discharge Instructions  Go home and rest quietly for the next 24 hours.  It is important to lie flat for the next 24 hours.  Get up only to go to the restroom.  You may lie in the bed or on a couch on your back, your stomach, your left side or your right side.  You may have one pillow under your head.  You may have pillows between your knees while you are on your side or under your knees while you are on your back.  DO NOT drive today.  Recline the seat as far back as it will go, while still wearing your seat belt, on the way home.  You may get up to go to the bathroom as needed.  You may sit up for 10 minutes to eat.  You may resume your normal diet and medications unless otherwise indicated.  The incidence of headache, nausea, or vomiting is about 5% (one in 20 patients).  If you develop a headache, lie flat and drink plenty of fluids until the headache goes away.  Caffeinated beverages may be helpful.  If you develop severe nausea and vomiting or a headache that does not go away with flat bed rest, call Dr. Orville Govern  You may resume normal activities after your 24 hours of bed rest is over; however, do not exert yourself strongly or do any heavy lifting tomorrow.  Call your physician for a follow-up appointment.  The results of your myelogram will be sent directly to your physician by the following day.  If you have any questions or if complications develop after you arrive home, please call Ashok Pall.  Discharge instructions have been explained to the patient.  The patient, or the person responsible for the patient, fully understands these instructions.

## 2022-09-24 ENCOUNTER — Ambulatory Visit: Payer: 59 | Attending: Nurse Practitioner | Admitting: Physical Therapy

## 2022-09-24 DIAGNOSIS — M5459 Other low back pain: Secondary | ICD-10-CM | POA: Insufficient documentation

## 2022-09-24 DIAGNOSIS — M5415 Radiculopathy, thoracolumbar region: Secondary | ICD-10-CM | POA: Diagnosis present

## 2022-09-24 DIAGNOSIS — M6281 Muscle weakness (generalized): Secondary | ICD-10-CM | POA: Diagnosis present

## 2022-09-24 NOTE — Therapy (Signed)
OUTPATIENT PHYSICAL THERAPY THORACOLUMBAR EVALUATION   Patient Name: [name] MRN: [id number] DOB:May 04, 1956, 67 y.o., female Today's Date: 09/24/2022  END OF SESSION:  PT End of Session - 09/24/22 1437     Visit Number 1    Number of Visits 16    Date for PT Re-Evaluation 11/19/22    Authorization Type U[employer] , MCD    PT Start Time 8341    PT Stop Time 1425    PT Time Calculation (min) 57 min    Activity Tolerance Patient tolerated treatment well    Behavior During Therapy WFL for tasks assessed/performed             Past Medical History:  Diagnosis Date   Arthritis    Back pain    Constipation    Depression    Fibromyalgia    Headache    Herniated disc    Hypertension    Joint pain    Knee pain    Lower extremity edema    Pre-diabetes    Prediabetes    Restless leg syndrome    Sarcoidosis 2000   Sciatica    Scoliosis    Snoring    SOB (shortness of breath)    Vitamin D deficiency    Past Surgical History:  Procedure Laterality Date   ABDOMINAL HYSTERECTOMY  1999   APPENDECTOMY  2019   BACK SURGERY     BREAST BIOPSY Right 12/2016   LAPAROSCOPIC APPENDECTOMY N/A 10/28/2017   Procedure: APPENDECTOMY LAPAROSCOPIC;  Surgeon: Leighton Ruff, MD;  Location: [facility];  Service: General;  Laterality: N/A;   TUBAL LIGATION     Patient Active Problem List   Diagnosis Date Noted   Lumbar adjacent segment disease with spondylolisthesis 02/26/2022   S/P lumbar spinal fusion 06/28/2021   Spondylolisthesis of lumbar region 06/28/2021   Constipation 02/21/2021   Diabetes mellitus ([place]) 11/26/2020   Chronic pain syndrome 11/26/2020   Appendicitis 10/28/2017   Cigarette smoker 07/26/2015   Dyspnea 05/29/2015   BACK PAIN, LUMBAR 05/29/2008   SARCOIDOSIS 12/21/2007   FATIGUE, CHRONIC 12/21/2007   RECTAL BLEEDING 12/14/2007   NECK PAIN, ACUTE 12/14/2007   ABDOMINAL PAIN, GENERALIZED 12/14/2007   DEPRESSION 05/13/2007   Hypertension associated with type 2  diabetes mellitus ([place]) 05/13/2007   PERIODONTAL DISEASE 05/13/2007   HEMATURIA 05/13/2007   ALLERGY 05/13/2007   Class 3 severe obesity with serious comorbidity and body mass index (BMI) of 40.0 to 44.9 in adult ([place]) 05/07/2007    PCP: Cletis Athens NP  REFERRING PROVIDER: Dr. Christella Noa   REFERRING DIAG: S/P Fusion, Spondylolisthesis  Rationale for Evaluation and Treatment: Rehabilitation  THERAPY DIAG:  Other low back pain  Muscle weakness (generalized)  Radiculopathy, thoracolumbar region  ONSET DATE: 02/26/2022  SUBJECTIVE:  SUBJECTIVE STATEMENT: Patient is here following lumbar fusion on 02/26/22.  This was her 2nd surgery from Dr. Christella Noa.  Patient had PT and an easier time than with the more recent surgery.  Since then she has continued to have pain in her low back and extending into her legs.  Patient has difficulty standing and walking even short periods. She does not use an assistive device. She has a significant difficulty with home tasks and recreational activity.  She used to walk, use the pool and go to the [facility].  She would love to be able to do this. She does not feel weakness necessarily.  She gets the wobbly feeling but she doesn't feel like she will fall now.  Her feet are always cold and numb.   "It takes me all day long to do 1 room" Has to sit every 5-10 min with home tasks.  She has to lean forward at the sink and the grocery store.  Sitting preferred to standing.   PERTINENT HISTORY:  Pre-diabetic, 06/28/2021: L4-L5 , L5-S1  Posterior lumbar interbody fusion  02/26/22:  L3-L4 PLIF See above   PAIN:  Are you having pain? Yes: NPRS scale: 8/10 Pain location: thoracolumbar spine and into posterior thighs  Pain description: vice turning  Aggravating factors: standing  Relieving  factors: sitting and laying down  Ibuprofen, Tylenol, ice on back  PRECAUTIONS: None  WEIGHT BEARING RESTRICTIONS: No  FALLS:  Has patient fallen in last 6 months? No  LIVING ENVIRONMENT: Lives with: lives with their partner Lives in: House/apartment Stairs: No Has following equipment at home: None  OCCUPATION: Not working, was a patient transport Training and development officer)  PLOF: Independent  PATIENT GOALS: I want to feel better, get better and walk again. Lose wgt.   NEXT MD VISIT:   OBJECTIVE:   DIAGNOSTIC FINDINGS:  09/2022: CT   IMPRESSION:   1. Interval pedicle screw and interbody fusion at L3-4. Interval   development of bilateral pars defects at L3. No significant stenosis   2. Solid fusion L4-5 and L5-S1. Mild to moderate subarticular and   foraminal stenosis bilaterally L5-S1 due to spurring.    PATIENT SURVEYS:  FOTO 36%  SCREENING FOR RED FLAGS: Bowel or bladder incontinence: No Spinal tumors: No Cauda equina syndrome: No Compression fracture: No Abdominal aneurysm: No  COGNITION: Overall cognitive status: Within functional limits for tasks assessed     SENSATION: Vague numbness in LEs, feet  MUSCLE LENGTH: NT    POSTURE: rounded shoulders, forward head, flexed trunk , and genu varus , knees flexed  PALPATION: L knee swollen> Rt.  Min tenderness in thoracolumbar spine Pain L L5-S1 and extending outward across superior glutes , Lt lateral hip and thigh   LUMBAR ROM:   AROM Eval 09/24/22  Flexion 80% limited pain  Extension 80% limited pain   Right lateral flexion 50% pain R side    Left lateral flexion 50% pain L side   Right rotation 25% min pain  Left rotation 25% min pain    (Blank rows = not tested)  LOWER EXTREMITY ROM:     Active  Right eval Left eval  Hip flexion    Hip extension    Hip abduction    Hip adduction    Hip internal rotation    Hip external rotation    Knee flexion 115 120+  Knee extension -10 -10  Ankle dorsiflexion     Ankle plantarflexion    Ankle inversion    Ankle eversion     (  Blank rows = not tested)  LOWER EXTREMITY MMT:    MMT Right eval Left eval  Hip flexion 3-/5 3-/5  Hip extension NT NT   Hip abduction 3/5 3/5  Hip adduction    Hip internal rotation    Hip external rotation    Knee flexion 5/5 5/5  Knee extension 5/5 5/5  Ankle dorsiflexion 5/5 5/5  Ankle plantarflexion    Ankle inversion    Ankle eversion     (Blank rows = not tested)  LUMBAR SPECIAL TESTS:  Straight leg raise test: Positive Lt.   FUNCTIONAL TESTS:  5 times sit to stand: unable to complete > 2 reps due to pain in L back and bilateral knees, 2 reps about 30 sec with hands  GAIT: Distance walked: 75 Assistive device utilized: None Level of assistance: Modified independence Comments: slow pace, antalgic gait    TODAY'S TREATMENT:                                                                                                                              DATE: 09/24/22   PT eval and Pt education.   Positioned in hooklying with legs supported for breathing/Tr A x 10  Lower trunk rotation x 10  Bent knee fall out x 10 each LE  Knee to chest x 1 , 30 sec with towel supporting LE    PATIENT EDUCATION:  Education details: PT, POC, HEP, core/breathing, pool , sit to stand  Person educated: Patient Education method: Consulting civil engineer, Demonstration, Verbal cues, and Handouts Education comprehension: verbalized understanding and needs further education  HOME EXERCISE PROGRAM: Access Code: [email or web address] URL: [email or web address] Date: 09/24/2022 Prepared by: Raeford Razor  Exercises - Supine Lower Trunk Rotation  - 1-2 x daily - 7 x weekly - 2 sets - 10 reps - 15 hold - Bent Knee Fallouts  - 1-2 x daily - 7 x weekly - 2 sets - 10 reps - Supine Transversus Abdominis Bracing - Hands on Stomach  - 2-3 x daily - 7 x weekly - 2 sets - 10 reps - 5-10 hold - Hooklying Single Knee to Chest Stretch  - 1-2 x  daily - 7 x weekly - 1 sets - 3 reps - 30 hold  ASSESSMENT:  CLINICAL IMPRESSION: Patient is a 67 y.o. female who was seen today for physical therapy evaluation and treatment for low back pain s/p lumbar fusion. She contined to have pain since her surgery which radiates to her legs, L>R.  She also has significant knee pain which limits her mobuility as well. She will benefit from skilled PT to improve her ability to perform basic ADLs and community mobility.  She may benefit from a walker or cane for pain relief  but she was not interested in that today.   OBJECTIVE IMPAIRMENTS: cardiopulmonary status limiting activity, decreased activity tolerance, decreased endurance, decreased knowledge of use of DME, decreased mobility, difficulty walking, decreased ROM, decreased strength, increased edema,  increased fascial restrictions, impaired flexibility, impaired sensation, improper body mechanics, postural dysfunction, obesity, and pain.   ACTIVITY LIMITATIONS: carrying, lifting, bending, sitting, standing, squatting, sleeping, stairs, transfers, bed mobility, locomotion level, and caring for others  PARTICIPATION LIMITATIONS: meal prep, cleaning, laundry, interpersonal relationship, driving, shopping, and community activity  PERSONAL FACTORS: Time since onset of injury/illness/exacerbation and 3+ comorbidities: previous back surgery, obesity, diabetes   are also affecting patient's functional outcome.   REHAB POTENTIAL: Good  CLINICAL DECISION MAKING: Evolving/moderate complexity  EVALUATION COMPLEXITY: Moderate   GOALS: Goals reviewed with patient? Yes  SHORT TERM GOALS: Target date: 10/22/2022    Pt will be I with HEP for core, posture and LE strength/mobility  Baseline: Goal status: INITIAL  2.  Pt will be able to complete functional assessment and set goal (sit to stand, gait)  Baseline: unable to complete due to knee pain  Goal status: INITIAL  3.  Pt will be able to perform bed  mobility and chair/mat transfers with no increase in pain  Baseline:  Goal status: INITIAL  4.  Pt will be able to stand for 10 min for light housework with pain <6/10 Baseline: 8/10+ Goal status: INITIAL   LONG TERM GOALS: Target date: 11/19/2022    Pt will be able to improve FOTO score to 47% or greater to demo improved functional mobility.  Baseline: 36% Goal status: INITIAL  2.  Pt will be able to improve hip strength to 4+/5 or better for improved back support with transfers and gait  Baseline:  Goal status: INITIAL  3.  Pt will be able to walk in the grocery store for 30 min with min increase in pain from baseline, using cart for support  Baseline: mod to severe  Goal status: INITIAL  4.  Pt will be able to perform proper hip hinge to improve safety with light home tasks (vs bending) and improved spine stability  Baseline:  Goal status: INITIAL  5.  Pt will be I with HEP upon discharge  Baseline:  Goal status: INITIAL   PLAN:  PT FREQUENCY: 2x/week  PT DURATION: 8 weeks  PLANNED INTERVENTIONS: Therapeutic exercises, Therapeutic activity, Neuromuscular re-education, Balance training, Gait training, Patient/Family education, Self Care, Aquatic Therapy, Dry Needling, Electrical stimulation, Cryotherapy, Moist heat, Manual lymph drainage, Manual therapy, and Re-evaluation.  PLAN FOR NEXT SESSION: check HEP.  Seated core, Nustep.  Sit to stand , (functional testing)   Jaelen Soth, PT 09/24/2022, 3:19 PM   Raeford Razor, PT 09/24/22 3:19 PM Phone: [phone] Fax: [phone]

## 2022-09-24 NOTE — Patient Instructions (Signed)
Aquatic Therapy at [facility]-  What to Expect! ? ?Where:  ? ?[facility] ?[address] [facility] [address] ?[place], [place] [address] ?Rehab phone [phone] ? ?NOTE:  You will receive an automated phone message reminding you of your appt and it will say the appointment is at the [address] [facility] [address] [facility] clinic. ? ?        ?How to Prepare: ?Please make sure you drink 8 ounces of water about one hour prior to your pool session ?A caregiver may attend if needed with the patient to help assist as needed. A caregiver can sit in the pool room on chair. ?Please arrive IN YOUR SUIT and 15 minutes prior to your appointment - this helps to avoid delays in starting your session. ?Please make sure to attend to any toileting needs prior to entering the pool ?Locker rooms for changing are provided.   There is direct access to the pool deck form the locker room.  You can lock your belongings in a locker with lock provided. ?Once on the pool deck your therapist will ask if you have signed the Patient  Consent and Assignment of Benefits form before beginning treatment ?Your therapist may take your blood pressure prior to, during and after your session if indicated ?We usually try and create a home exercise program based on activities we do in the pool.  Please be thinking about who might be able to assist you in the pool should you need to participate in an aquatic home exercise program at the time of discharge if you need assistance.  Some patients do not want to or do not have the ability to participate in an aquatic home program - this is not a barrier in any way to you participating in aquatic therapy as part of your current therapy plan! ?After Discharge from PT, you can continue using home program at  the [facility]/, there is a drop-in fee for $5 ($45 a month)or for 60 years  or older $4.00 ($40 a month for seniors ) or any local [facility] pool.  Memberships for purchase are  available for gym/pool at [facility] ? ?IT IS VERY IMPORTANT THAT YOUR LAST VISIT BE IN THE CLINIC AT [address] AFTER YOUR LAST AQUATIC VISIT.  PLEASE MAKE SURE THAT YOU HAVE A [address]  APPOINTMENT SCHEDULED. ? ? ?About the pool: ?Pool is located approximately 500 FT from the entrance of the building.  ?Please bring a support person if you need assistance traveling this      distance.  ? ?Your therapist will assist you in entering the water; there are two ways to     ?      enter: stairs with railings, and a mechanical lift. Your therapist will determine the most appropriate way for you. ? ?Water temperature is usually between 88-90 degrees ? ?There may be up to 2 other swimmers in the pool at the same time ? ?The pool deck is tile, please wear shoes with good traction if you prefer not to be barefoot.  ? ? ?Contact Info:  ?For appointment scheduling and cancellations:         Please call the [facility]  PH:[phone]              Aquatic Therapy  ?[facility]       All sessions are 45 minutes ? ? ? ? ?        ? ?                ?                      ?

## 2022-09-29 ENCOUNTER — Ambulatory Visit: Payer: 59 | Admitting: Physical Therapy

## 2022-09-29 ENCOUNTER — Encounter: Payer: Self-pay | Admitting: Physical Therapy

## 2022-09-29 DIAGNOSIS — M5459 Other low back pain: Secondary | ICD-10-CM | POA: Diagnosis not present

## 2022-09-29 DIAGNOSIS — M5415 Radiculopathy, thoracolumbar region: Secondary | ICD-10-CM

## 2022-09-29 DIAGNOSIS — M6281 Muscle weakness (generalized): Secondary | ICD-10-CM

## 2022-09-29 NOTE — Therapy (Signed)
OUTPATIENT PHYSICAL THERAPY THORACOLUMBAR EVALUATION   Patient Name: Shannon [name] MRN: [id number] DOB:12-29-1955, 67 y.o., female Today's Date: 09/29/2022  END OF SESSION:  PT End of Session - 09/29/22 1507     Visit Number 2    Number of Visits 16    Date for PT Re-Evaluation 11/19/22    Authorization Type UHC , MCD    PT Start Time 1500    PT Stop Time 1553    PT Time Calculation (min) 53 min    Activity Tolerance Patient tolerated treatment well    Behavior During Therapy WFL for tasks assessed/performed              Past Medical History:  Diagnosis Date   Arthritis    Back pain    Constipation    Depression    Fibromyalgia    Headache    Herniated disc    Hypertension    Joint pain    Knee pain    Lower extremity edema    Pre-diabetes    Prediabetes    Restless leg syndrome    Sarcoidosis 2000   Sciatica    Scoliosis    Snoring    SOB (shortness of breath)    Vitamin D deficiency    Past Surgical History:  Procedure Laterality Date   ABDOMINAL HYSTERECTOMY  1999   APPENDECTOMY  2019   BACK SURGERY     BREAST BIOPSY Right 12/2016   LAPAROSCOPIC APPENDECTOMY N/A 10/28/2017   Procedure: APPENDECTOMY LAPAROSCOPIC;  Surgeon: Leighton Ruff, MD;  Location: [facility];  Service: General;  Laterality: N/A;   TUBAL LIGATION     Patient Active Problem List   Diagnosis Date Noted   Lumbar adjacent segment disease with spondylolisthesis 02/26/2022   S/P lumbar spinal fusion 06/28/2021   Spondylolisthesis of lumbar region 06/28/2021   Constipation 02/21/2021   Diabetes mellitus ([place]) 11/26/2020   Chronic pain syndrome 11/26/2020   Appendicitis 10/28/2017   Cigarette smoker 07/26/2015   Dyspnea 05/29/2015   BACK PAIN, LUMBAR 05/29/2008   SARCOIDOSIS 12/21/2007   FATIGUE, CHRONIC 12/21/2007   RECTAL BLEEDING 12/14/2007   NECK PAIN, ACUTE 12/14/2007   ABDOMINAL PAIN, GENERALIZED 12/14/2007   DEPRESSION 05/13/2007   Hypertension associated with type 2  diabetes mellitus ([place]) 05/13/2007   PERIODONTAL DISEASE 05/13/2007   HEMATURIA 05/13/2007   ALLERGY 05/13/2007   Class 3 severe obesity with serious comorbidity and body mass index (BMI) of 40.0 to 44.9 in adult ([place]) 05/07/2007    PCP: Cletis Athens NP  REFERRING PROVIDER: Dr. Christella Noa   REFERRING DIAG: S/P Fusion, Spondylolisthesis  Rationale for Evaluation and Treatment: Rehabilitation  THERAPY DIAG:  Other low back pain  Muscle weakness (generalized)  Radiculopathy, thoracolumbar region  ONSET DATE: 02/26/2022  SUBJECTIVE:  SUBJECTIVE STATEMENT: Patient is here following lumbar fusion on 02/26/22.  This was her 2nd surgery from Dr. Christella Noa.  Patient had PT and an easier time than with the more recent surgery.  Since then she has continued to have pain in her low back and extending into her legs.  Patient has difficulty standing and walking even short periods. She does not use an assistive device. She has a significant difficulty with home tasks and recreational activity.  She used to walk, use the pool and go to the [facility].  She would love to be able to do this. She does not feel weakness necessarily.  She gets the wobbly feeling but she doesn't feel like she will fall now.  Her feet are always cold and numb.   "It takes me all day long to do 1 room" Has to sit every 5-10 min with home tasks.  She has to lean forward at the sink and the grocery store.  Sitting preferred to standing.   PERTINENT HISTORY:  Pre-diabetic, 06/28/2021: L4-L5 , L5-S1  Posterior lumbar interbody fusion  02/26/22:  L3-L4 PLIF See above   PAIN:  Are you having pain? Yes: NPRS scale: 8/10 Pain location: thoracolumbar spine and into posterior thighs  Pain description: vice turning  Aggravating factors: standing  Relieving  factors: sitting and laying down  Ibuprofen, Tylenol, ice on back  PRECAUTIONS: None  WEIGHT BEARING RESTRICTIONS: No  FALLS:  Has patient fallen in last 6 months? No  LIVING ENVIRONMENT: Lives with: lives with their partner Lives in: House/apartment Stairs: No Has following equipment at home: None  OCCUPATION: Not working, was a patient transport Training and development officer)  PLOF: Independent  PATIENT GOALS: I want to feel better, get better and walk again. Lose wgt.   NEXT MD VISIT:   OBJECTIVE:   DIAGNOSTIC FINDINGS:  09/2022: CT   IMPRESSION:   1. Interval pedicle screw and interbody fusion at L3-4. Interval   development of bilateral pars defects at L3. No significant stenosis   2. Solid fusion L4-5 and L5-S1. Mild to moderate subarticular and   foraminal stenosis bilaterally L5-S1 due to spurring.    PATIENT SURVEYS:  FOTO 36%  SCREENING FOR RED FLAGS: Bowel or bladder incontinence: No Spinal tumors: No Cauda equina syndrome: No Compression fracture: No Abdominal aneurysm: No  COGNITION: Overall cognitive status: Within functional limits for tasks assessed     SENSATION: Vague numbness in LEs, feet  MUSCLE LENGTH: NT    POSTURE: rounded shoulders, forward head, flexed trunk , and genu varus , knees flexed  PALPATION: L knee swollen> Rt.  Min tenderness in thoracolumbar spine Pain L L5-S1 and extending outward across superior glutes , Lt lateral hip and thigh   LUMBAR ROM:   AROM Eval 09/24/22  Flexion 80% limited pain  Extension 80% limited pain   Right lateral flexion 50% pain R side    Left lateral flexion 50% pain L side   Right rotation 25% min pain  Left rotation 25% min pain    (Blank rows = not tested)  LOWER EXTREMITY ROM:     Active  Right eval Left eval  Hip flexion    Hip extension    Hip abduction    Hip adduction    Hip internal rotation    Hip external rotation    Knee flexion 115 120+  Knee extension -10 -10  Ankle dorsiflexion     Ankle plantarflexion    Ankle inversion    Ankle eversion     (  Blank rows = not tested)  LOWER EXTREMITY MMT:    MMT Right eval Left eval  Hip flexion 3-/5 3-/5  Hip extension NT NT   Hip abduction 3/5 3/5  Hip adduction    Hip internal rotation    Hip external rotation    Knee flexion 5/5 5/5  Knee extension 5/5 5/5  Ankle dorsiflexion 5/5 5/5  Ankle plantarflexion    Ankle inversion    Ankle eversion     (Blank rows = not tested)  LUMBAR SPECIAL TESTS:  Straight leg raise test: Positive Lt.   FUNCTIONAL TESTS:  5 times sit to stand: unable to complete > 2 reps due to pain in L back and bilateral knees, 2 reps about 30 sec with hands  09/29/22: 2 min walk test: 398 no rest break , o2 sats 97% and HR 125, knee pain limited her- 8/10 pain  GAIT: Distance walked: 75 Assistive device utilized: None Level of assistance: Modified independence Comments: slow pace, antalgic gait    TODAY'S TREATMENT:        [facility] Adult PT Treatment:                                                DATE: 09/29/22 Therapeutic Exercise: NuStep Level 5 , UE and LE for 6 min  Seated core/breathing with ball on thighs x 10  2 min walk test Supine Lower Trunk Rotation x 10  LTR with wide knees for hip ROM  Supine Transversus Abdominis Bracing  x 10  Supine march x 10 , pain LLE  Bent Knee Fallouts  -x  10 LLE pain  Hooklying Single Knee to Chest Stretch  - painful  Modalities: Cold pack 10 min                                                                                                                     DATE: 09/24/22   PT eval and Pt education.   Positioned in hooklying with legs supported for breathing/Tr A x 10  Lower trunk rotation x 10  Bent knee fall out x 10 each LE  Knee to chest x 1 , 30 sec with towel supporting LE    PATIENT EDUCATION:  Education details: PT, POC, HEP, core/breathing, pool , sit to stand  Person educated: Patient Education method: Consulting civil engineer,  Demonstration, Verbal cues, and Handouts Education comprehension: verbalized understanding and needs further education  HOME EXERCISE PROGRAM: Access Code: [email or web address] URL: [email or web address] Date: 09/24/2022 Prepared by: Raeford Razor  Exercises - Supine Lower Trunk Rotation  - 1-2 x daily - 7 x weekly - 2 sets - 10 reps - 15 hold - Bent Knee Fallouts  - 1-2 x daily - 7 x weekly - 2 sets - 10 reps - Supine Transversus Abdominis Bracing - Hands on Stomach  - 2-3 x daily - 7 x weekly -  2 sets - 10 reps - 5-10 hold - Hooklying Single Knee to Chest Stretch  - 1-2 x daily - 7 x weekly - 1 sets - 3 reps - 30 hold  ASSESSMENT:  CLINICAL IMPRESSION: Patient tolerated first treatment session well but did complain of left-sided hip/glute pain with several of the exercises.  Completed 2-minute walk test ,patient limited by knee pain. Focused on core stabilization/  Cont POC.   OBJECTIVE IMPAIRMENTS: cardiopulmonary status limiting activity, decreased activity tolerance, decreased endurance, decreased knowledge of use of DME, decreased mobility, difficulty walking, decreased ROM, decreased strength, increased edema, increased fascial restrictions, impaired flexibility, impaired sensation, improper body mechanics, postural dysfunction, obesity, and pain.   ACTIVITY LIMITATIONS: carrying, lifting, bending, sitting, standing, squatting, sleeping, stairs, transfers, bed mobility, locomotion level, and caring for others  PARTICIPATION LIMITATIONS: meal prep, cleaning, laundry, interpersonal relationship, driving, shopping, and community activity  PERSONAL FACTORS: Time since onset of injury/illness/exacerbation and 3+ comorbidities: previous back surgery, obesity, diabetes   are also affecting patient's functional outcome.   REHAB POTENTIAL: Good  CLINICAL DECISION MAKING: Evolving/moderate complexity  EVALUATION COMPLEXITY: Moderate   GOALS: Goals reviewed with patient? Yes  SHORT  TERM GOALS: Target date: 10/22/2022    Pt will be I with HEP for core, posture and LE strength/mobility  Baseline: Goal status: INITIAL  2.  Pt will be able to complete functional assessment and set goal (sit to stand, gait)  Baseline: unable to complete due to knee pain  Goal status: INITIAL  3.  Pt will be able to perform bed mobility and chair/mat transfers with no increase in pain  Baseline:  Goal status: INITIAL  4.  Pt will be able to stand for 10 min for light housework with pain <6/10 Baseline: 8/10+ Goal status: INITIAL   LONG TERM GOALS: Target date: 11/19/2022    Pt will be able to improve FOTO score to 47% or greater to demo improved functional mobility.  Baseline: 36% Goal status: INITIAL  2.  Pt will be able to improve hip strength to 4+/5 or better for improved back support with transfers and gait  Baseline:  Goal status: INITIAL  3.  Pt will be able to walk in the grocery store for 30 min with min increase in pain from baseline, using cart for support  Baseline: mod to severe  Goal status: INITIAL  4.  Pt will be able to perform proper hip hinge to improve safety with light home tasks (vs bending) and improved spine stability  Baseline:  Goal status: INITIAL  5.  Pt will be I with HEP upon discharge  Baseline:  Goal status: INITIAL   PLAN:  PT FREQUENCY: 2x/week  PT DURATION: 8 weeks  PLANNED INTERVENTIONS: Therapeutic exercises, Therapeutic activity, Neuromuscular re-education, Balance training, Gait training, Patient/Family education, Self Care, Aquatic Therapy, Dry Needling, Electrical stimulation, Cryotherapy, Moist heat, Manual lymph drainage, Manual therapy, and Re-evaluation.  PLAN FOR NEXT SESSION: check HEP.  Seated core, Nustep.  Sit to stand , (functional testing)   Shann Lewellyn, PT 09/29/2022, 3:44 PM   Raeford Razor, PT 09/29/22 3:44 PM Phone: [phone] Fax: [phone]

## 2022-10-01 ENCOUNTER — Ambulatory Visit: Payer: 59 | Admitting: Physical Therapy

## 2022-10-01 ENCOUNTER — Encounter: Payer: Self-pay | Admitting: Physical Therapy

## 2022-10-01 DIAGNOSIS — M5459 Other low back pain: Secondary | ICD-10-CM | POA: Diagnosis not present

## 2022-10-01 DIAGNOSIS — M5415 Radiculopathy, thoracolumbar region: Secondary | ICD-10-CM

## 2022-10-01 DIAGNOSIS — M6281 Muscle weakness (generalized): Secondary | ICD-10-CM

## 2022-10-01 NOTE — Therapy (Signed)
OUTPATIENT PHYSICAL THERAPY THORACOLUMBAR EVALUATION   Patient Name: Shannon [name] MRN: [id number] DOB:01-22-1956, 67 y.o., female Today's Date: 10/01/2022  END OF SESSION:  PT End of Session - 10/01/22 1427     Visit Number 3    Number of Visits 16    Date for PT Re-Evaluation 11/19/22    Authorization Type UHC , MCD    PT Start Time 4098    PT Stop Time 1505    PT Time Calculation (min) 45 min    Activity Tolerance Patient tolerated treatment well    Behavior During Therapy WFL for tasks assessed/performed               Past Medical History:  Diagnosis Date   Arthritis    Back pain    Constipation    Depression    Fibromyalgia    Headache    Herniated disc    Hypertension    Joint pain    Knee pain    Lower extremity edema    Pre-diabetes    Prediabetes    Restless leg syndrome    Sarcoidosis 2000   Sciatica    Scoliosis    Snoring    SOB (shortness of breath)    Vitamin D deficiency    Past Surgical History:  Procedure Laterality Date   ABDOMINAL HYSTERECTOMY  1999   APPENDECTOMY  2019   BACK SURGERY     BREAST BIOPSY Right 12/2016   LAPAROSCOPIC APPENDECTOMY N/A 10/28/2017   Procedure: APPENDECTOMY LAPAROSCOPIC;  Surgeon: Leighton Ruff, MD;  Location: [facility];  Service: General;  Laterality: N/A;   TUBAL LIGATION     Patient Active Problem List   Diagnosis Date Noted   Lumbar adjacent segment disease with spondylolisthesis 02/26/2022   S/P lumbar spinal fusion 06/28/2021   Spondylolisthesis of lumbar region 06/28/2021   Constipation 02/21/2021   Diabetes mellitus ([place]) 11/26/2020   Chronic pain syndrome 11/26/2020   Appendicitis 10/28/2017   Cigarette smoker 07/26/2015   Dyspnea 05/29/2015   BACK PAIN, LUMBAR 05/29/2008   SARCOIDOSIS 12/21/2007   FATIGUE, CHRONIC 12/21/2007   RECTAL BLEEDING 12/14/2007   NECK PAIN, ACUTE 12/14/2007   ABDOMINAL PAIN, GENERALIZED 12/14/2007   DEPRESSION 05/13/2007   Hypertension associated with type 2  diabetes mellitus ([place]) 05/13/2007   PERIODONTAL DISEASE 05/13/2007   HEMATURIA 05/13/2007   ALLERGY 05/13/2007   Class 3 severe obesity with serious comorbidity and body mass index (BMI) of 40.0 to 44.9 in adult ([place]) 05/07/2007    PCP: Cletis Athens NP  REFERRING PROVIDER: Dr. Christella Noa   REFERRING DIAG: S/P Fusion, Spondylolisthesis  Rationale for Evaluation and Treatment: Rehabilitation  THERAPY DIAG:  Other low back pain  Radiculopathy, thoracolumbar region  Muscle weakness (generalized)  ONSET DATE: 02/26/2022  SUBJECTIVE:  SUBJECTIVE STATEMENT: I am about the same. Back and knee is 7/10.   PERTINENT HISTORY:  Pre-diabetic, 06/28/2021: L4-L5 , L5-S1  Posterior lumbar interbody fusion  02/26/22:  L3-L4 PLIF See above   PAIN:  Are you having pain? Yes: NPRS scale: 8/10 Pain location: thoracolumbar spine and into posterior thighs  Pain description: vice turning  Aggravating factors: standing  Relieving factors: sitting and laying down  Ibuprofen, Tylenol, ice on back  PRECAUTIONS: None  WEIGHT BEARING RESTRICTIONS: No  FALLS:  Has patient fallen in last 6 months? No  LIVING ENVIRONMENT: Lives with: lives with their partner Lives in: House/apartment Stairs: No Has following equipment at home: None  OCCUPATION: Not working, was a patient transport Training and development officer)  PLOF: Independent  PATIENT GOALS: I want to feel better, get better and walk again. Lose wgt.   NEXT MD VISIT:   OBJECTIVE:   DIAGNOSTIC FINDINGS:  09/2022: CT   IMPRESSION:   1. Interval pedicle screw and interbody fusion at L3-4. Interval   development of bilateral pars defects at L3. No significant stenosis   2. Solid fusion L4-5 and L5-S1. Mild to moderate subarticular and   foraminal stenosis bilaterally  L5-S1 due to spurring.    PATIENT SURVEYS:  FOTO 36%  SCREENING FOR RED FLAGS: Bowel or bladder incontinence: No Spinal tumors: No Cauda equina syndrome: No Compression fracture: No Abdominal aneurysm: No  COGNITION: Overall cognitive status: Within functional limits for tasks assessed     SENSATION: Vague numbness in LEs, feet  MUSCLE LENGTH: NT    POSTURE: rounded shoulders, forward head, flexed trunk , and genu varus , knees flexed  PALPATION: L knee swollen> Rt.  Min tenderness in thoracolumbar spine Pain L L5-S1 and extending outward across superior glutes , Lt lateral hip and thigh   LUMBAR ROM:   AROM Eval 09/24/22  Flexion 80% limited pain  Extension 80% limited pain   Right lateral flexion 50% pain R side    Left lateral flexion 50% pain L side   Right rotation 25% min pain  Left rotation 25% min pain    (Blank rows = not tested)  LOWER EXTREMITY ROM:     Active  Right eval Left eval  Hip flexion    Hip extension    Hip abduction    Hip adduction    Hip internal rotation    Hip external rotation    Knee flexion 115 120+  Knee extension -10 -10  Ankle dorsiflexion    Ankle plantarflexion    Ankle inversion    Ankle eversion     (Blank rows = not tested)  LOWER EXTREMITY MMT:    MMT Right eval Left eval  Hip flexion 3-/5 3-/5  Hip extension NT NT   Hip abduction 3/5 3/5  Hip adduction    Hip internal rotation    Hip external rotation    Knee flexion 5/5 5/5  Knee extension 5/5 5/5  Ankle dorsiflexion 5/5 5/5  Ankle plantarflexion    Ankle inversion    Ankle eversion     (Blank rows = not tested)  LUMBAR SPECIAL TESTS:  Straight leg raise test: Positive Lt.   FUNCTIONAL TESTS:  5 times sit to stand: unable to complete > 2 reps due to pain in L back and bilateral knees, 2 reps about 30 sec with hands  09/29/22: 2 min walk test: 398 no rest break , o2 sats 97% and HR 125, knee pain limited her- 8/10 pain  GAIT: Distance  walked: 75 Assistive device utilized: None Level of assistance: Modified independence Comments: slow pace, antalgic gait    TODAY'S TREATMENT:      [facility] Adult PT Treatment:                                                DATE: 10/01/22 Therapeutic Exercise: Nustep Level 5 UE and LE for 6 min  Standing core: shoulder extension and row x 10 core focus with Pilates springboard 1 yellow  Palloff press slastix x 10  Supine overhead narrow grip x 15 green band for maintaining trunk stability  Horizontal pull with ab set greenband  Ball squeeze x  10 with TrA  Anterior/pelvic tilt x 10  PPT with march x 10  Bridge (legs on bolster) x 10 unable to do this with knees bent , had to do this with knees extended (glute set)  Hamstring stretch 3 x 15 -30 sec Lower trunk rotation x 10     [facility] Adult PT Treatment:                                                DATE: 09/29/22 Therapeutic Exercise: NuStep Level 5 , UE and LE for 6 min  Seated core/breathing with ball on thighs x 10  2 min walk test Supine Lower Trunk Rotation x 10  LTR with wide knees for hip ROM  Supine Transversus Abdominis Bracing  x 10  Supine march x 10 , pain LLE  Bent Knee Fallouts  -x  10 LLE pain  Hooklying Single Knee to Chest Stretch  - painful  Modalities: Cold pack 10 min                                                                                                                     DATE: 09/24/22   PT eval and Pt education.   Positioned in hooklying with legs supported for breathing/Tr A x 10  Lower trunk rotation x 10  Bent knee fall out x 10 each LE  Knee to chest x 1 , 30 sec with towel supporting LE    PATIENT EDUCATION:  Education details: PT, POC, HEP, core/breathing, pool , sit to stand  Person educated: Patient Education method: Consulting civil engineer, Demonstration, Verbal cues, and Handouts Education comprehension: verbalized understanding and needs further education  HOME EXERCISE PROGRAM: Access Code:  [email or web address] URL: [email or web address] Date: 09/24/2022 Prepared by: Raeford Razor  Exercises - Supine Lower Trunk Rotation  - 1-2 x daily - 7 x weekly - 2 sets - 10 reps - 15 hold - Bent Knee Fallouts  - 1-2 x daily - 7 x weekly - 2 sets - 10 reps - Supine Transversus Abdominis Bracing - Hands on Stomach  -  2-3 x daily - 7 x weekly - 2 sets - 10 reps - 5-10 hold - Hooklying Single Knee to Chest Stretch  - 1-2 x daily - 7 x weekly - 1 sets - 3 reps - 30 hold  ASSESSMENT:  CLINICAL IMPRESSION: Patient able to work on core strength and gentle hip strength throughout session with min increase in back pain  Pain reduced after session with gentle stretching. Bridging more effective when legs elevated but some back pain with this. Good log roll, body mechanics with sit to supine to sit. Asked her to prioritize HEP and core activation/bracing over the rest of the week. She is overall deconditioned but works hard during session.   OBJECTIVE IMPAIRMENTS: cardiopulmonary status limiting activity, decreased activity tolerance, decreased endurance, decreased knowledge of use of DME, decreased mobility, difficulty walking, decreased ROM, decreased strength, increased edema, increased fascial restrictions, impaired flexibility, impaired sensation, improper body mechanics, postural dysfunction, obesity, and pain.   ACTIVITY LIMITATIONS: carrying, lifting, bending, sitting, standing, squatting, sleeping, stairs, transfers, bed mobility, locomotion level, and caring for others  PARTICIPATION LIMITATIONS: meal prep, cleaning, laundry, interpersonal relationship, driving, shopping, and community activity  PERSONAL FACTORS: Time since onset of injury/illness/exacerbation and 3+ comorbidities: previous back surgery, obesity, diabetes   are also affecting patient's functional outcome.   REHAB POTENTIAL: Good  CLINICAL DECISION MAKING: Evolving/moderate complexity  EVALUATION COMPLEXITY:  Moderate   GOALS: Goals reviewed with patient? Yes  SHORT TERM GOALS: Target date: 10/22/2022    Pt will be I with HEP for core, posture and LE strength/mobility  Baseline: Goal status: MET   2.  Pt will be able to complete functional assessment and set goal (sit to stand, gait)  Baseline: unable to complete due to knee pain  Goal status: MET   3.  Pt will be able to perform bed mobility and chair/mat transfers with no increase in pain  Baseline:  Goal status: MET   4.  Pt will be able to stand for 10 min for light housework with pain <6/10 Baseline: 8/10+ Goal status: INITIAL   LONG TERM GOALS: Target date: 11/19/2022    Pt will be able to improve FOTO score to 47% or greater to demo improved functional mobility.  Baseline: 36% Goal status: INITIAL  2.  Pt will be able to improve hip strength to 4+/5 or better for improved back support with transfers and gait  Baseline:  Goal status: INITIAL  3.  Pt will be able to walk in the grocery store for 30 min with min increase in pain from baseline, using cart for support  Baseline: mod to severe  Goal status: INITIAL  4.  Pt will be able to perform proper hip hinge to improve safety with light home tasks (vs bending) and improved spine stability  Baseline:  Goal status: INITIAL  5.  Pt will be I with HEP upon discharge  Baseline:  Goal status: INITIAL  6.  Pt will be able to walk 450 feet in 2 min walk test with knee pain no more than 6/10, no increase in back pain .  Baseline: 398, knee 8/10 Goal status: INITIAL  PLAN:  PT FREQUENCY: 2x/week  PT DURATION: 8 weeks  PLANNED INTERVENTIONS: Therapeutic exercises, Therapeutic activity, Neuromuscular re-education, Balance training, Gait training, Patient/Family education, Self Care, Aquatic Therapy, Dry Needling, Electrical stimulation, Cryotherapy, Moist heat, Manual lymph drainage, Manual therapy, and Re-evaluation.  PLAN FOR NEXT SESSION : add to HEP.  Standing vs  Seated core, Nustep.  Sit to stand , (functional testing)   Jen Eppinger, PT 10/01/2022, 2:28 PM   Raeford Razor, PT 10/01/22 2:28 PM Phone: [phone] Fax: [phone]

## 2022-10-06 ENCOUNTER — Ambulatory Visit: Payer: 59 | Attending: Nurse Practitioner | Admitting: Physical Therapy

## 2022-10-06 DIAGNOSIS — M5415 Radiculopathy, thoracolumbar region: Secondary | ICD-10-CM | POA: Insufficient documentation

## 2022-10-06 DIAGNOSIS — M6281 Muscle weakness (generalized): Secondary | ICD-10-CM | POA: Insufficient documentation

## 2022-10-06 DIAGNOSIS — M5459 Other low back pain: Secondary | ICD-10-CM | POA: Insufficient documentation

## 2022-10-06 NOTE — Therapy (Signed)
OUTPATIENT PHYSICAL THERAPY THORACOLUMBAR EVALUATION   Patient Name: Shannon [name] MRN: [id number] DOB:Jul 05, 1956, 67 y.o., female Today's Date: 10/06/2022  END OF SESSION:  PT End of Session - 10/06/22 1507     Visit Number 4    Number of Visits 16    Date for PT Re-Evaluation 11/19/22    Authorization Type [employer] , MCD    PT Start Time 1503    PT Stop Time 1545    PT Time Calculation (min) 42 min    Activity Tolerance Patient tolerated treatment well    Behavior During Therapy WFL for tasks assessed/performed                Past Medical History:  Diagnosis Date   Arthritis    Back pain    Constipation    Depression    Fibromyalgia    Headache    Herniated disc    Hypertension    Joint pain    Knee pain    Lower extremity edema    Pre-diabetes    Prediabetes    Restless leg syndrome    Sarcoidosis 2000   Sciatica    Scoliosis    Snoring    SOB (shortness of breath)    Vitamin D deficiency    Past Surgical History:  Procedure Laterality Date   ABDOMINAL HYSTERECTOMY  1999   APPENDECTOMY  2019   BACK SURGERY     BREAST BIOPSY Right 12/2016   LAPAROSCOPIC APPENDECTOMY N/A 10/28/2017   Procedure: APPENDECTOMY LAPAROSCOPIC;  Surgeon: Leighton Ruff, MD;  Location: [facility];  Service: General;  Laterality: N/A;   TUBAL LIGATION     Patient Active Problem List   Diagnosis Date Noted   Lumbar adjacent segment disease with spondylolisthesis 02/26/2022   S/P lumbar spinal fusion 06/28/2021   Spondylolisthesis of lumbar region 06/28/2021   Constipation 02/21/2021   Diabetes mellitus ([place]) 11/26/2020   Chronic pain syndrome 11/26/2020   Appendicitis 10/28/2017   Cigarette smoker 07/26/2015   Dyspnea 05/29/2015   BACK PAIN, LUMBAR 05/29/2008   SARCOIDOSIS 12/21/2007   FATIGUE, CHRONIC 12/21/2007   RECTAL BLEEDING 12/14/2007   NECK PAIN, ACUTE 12/14/2007   ABDOMINAL PAIN, GENERALIZED 12/14/2007   DEPRESSION 05/13/2007   Hypertension associated with type  2 diabetes mellitus ([place]) 05/13/2007   PERIODONTAL DISEASE 05/13/2007   HEMATURIA 05/13/2007   ALLERGY 05/13/2007   Class 3 severe obesity with serious comorbidity and body mass index (BMI) of 40.0 to 44.9 in adult ([place]) 05/07/2007    PCP: Cletis Athens NP  REFERRING PROVIDER: Dr. Christella Noa   REFERRING DIAG: S/P Fusion, Spondylolisthesis  Rationale for Evaluation and Treatment: Rehabilitation  THERAPY DIAG:  Other low back pain  Radiculopathy, thoracolumbar region  Muscle weakness (generalized)  ONSET DATE: 02/26/2022  SUBJECTIVE:  SUBJECTIVE STATEMENT: I am sleepy.  Back pain 8/10.  Sugar was high too.  Knees also painful 8/10.   PERTINENT HISTORY:  Pre-diabetic, 06/28/2021: L4-L5 , L5-S1  Posterior lumbar interbody fusion  02/26/22:  L3-L4 PLIF See above   PAIN:  Are you having pain? Yes: NPRS scale: 8/10 Pain location: thoracolumbar spine and into posterior thighs  Pain description: vice turning  Aggravating factors: standing  Relieving factors: sitting and laying down  Ibuprofen, Tylenol, ice on back  PRECAUTIONS: None  WEIGHT BEARING RESTRICTIONS: No  FALLS:  Has patient fallen in last 6 months? No  LIVING ENVIRONMENT: Lives with: lives with their partner Lives in: House/apartment Stairs: No Has following equipment at home: None  OCCUPATION: Not working, was a patient transport Training and development officer)  PLOF: Independent  PATIENT GOALS: I want to feel better, get better and walk again. Lose wgt.   NEXT MD VISIT:   OBJECTIVE:   DIAGNOSTIC FINDINGS:  09/2022: CT   IMPRESSION:   1. Interval pedicle screw and interbody fusion at L3-4. Interval   development of bilateral pars defects at L3. No significant stenosis   2. Solid fusion L4-5 and L5-S1. Mild to moderate subarticular and    foraminal stenosis bilaterally L5-S1 due to spurring.    PATIENT SURVEYS:  FOTO 36%  SCREENING FOR RED FLAGS: Bowel or bladder incontinence: No Spinal tumors: No Cauda equina syndrome: No Compression fracture: No Abdominal aneurysm: No  COGNITION: Overall cognitive status: Within functional limits for tasks assessed     SENSATION: Vague numbness in LEs, feet  MUSCLE LENGTH: NT    POSTURE: rounded shoulders, forward head, flexed trunk , and genu varus , knees flexed  PALPATION: L knee swollen> Rt.  Min tenderness in thoracolumbar spine Pain L L5-S1 and extending outward across superior glutes , Lt lateral hip and thigh   LUMBAR ROM:   AROM Eval 09/24/22  Flexion 80% limited pain  Extension 80% limited pain   Right lateral flexion 50% pain R side    Left lateral flexion 50% pain L side   Right rotation 25% min pain  Left rotation 25% min pain    (Blank rows = not tested)  LOWER EXTREMITY ROM:     Active  Right eval Left eval  Hip flexion    Hip extension    Hip abduction    Hip adduction    Hip internal rotation    Hip external rotation    Knee flexion 115 120+  Knee extension -10 -10  Ankle dorsiflexion    Ankle plantarflexion    Ankle inversion    Ankle eversion     (Blank rows = not tested)  LOWER EXTREMITY MMT:    MMT Right eval Left eval  Hip flexion 3-/5 3-/5  Hip extension NT NT   Hip abduction 3/5 3/5  Hip adduction    Hip internal rotation    Hip external rotation    Knee flexion 5/5 5/5  Knee extension 5/5 5/5  Ankle dorsiflexion 5/5 5/5  Ankle plantarflexion    Ankle inversion    Ankle eversion     (Blank rows = not tested)  LUMBAR SPECIAL TESTS:  Straight leg raise test: Positive Lt.   FUNCTIONAL TESTS:  5 times sit to stand: unable to complete > 2 reps due to pain in L back and bilateral knees, 2 reps about 30 sec with hands  09/29/22: 2 min walk test: 398 no rest break , o2 sats 97% and HR 125, knee pain  limited her-  8/10 pain  GAIT: Distance walked: 75 Assistive device utilized: None Level of assistance: Modified independence Comments: slow pace, antalgic gait    TODAY'S TREATMENT:       [facility] Adult PT Treatment:                                                DATE: 10/06/22 Therapeutic Exercise: Nustep L5 UE and LE for 6 min  Hip extension x 2 x 10  Hip Abduction x 2 x 10  High march x 10 Seated pigeon x 3 , 30 sec  Abdominal Bracing  (used dyna disc under pelvis) Added march, bent knee fall out and UE overhead lift (6 lbs ) about 10 reps each , cues throughout  Lower trunk rotation x 10  LAD each LE x 3-4 reps   [facility] Adult PT Treatment:                                                DATE: 10/01/22 Therapeutic Exercise: Nustep Level 5 UE and LE for 6 min  Standing core: shoulder extension and row x 10 core focus with Pilates springboard 1 yellow  Palloff press slastix x 10  Supine overhead narrow grip x 15 green band for maintaining trunk stability  Horizontal pull with ab set greenband  Ball squeeze x  10 with TrA  Anterior/pelvic tilt x 10  PPT with march x 10  Bridge (legs on bolster) x 10 unable to do this with knees bent , had to do this with knees extended (glute set)  Hamstring stretch 3 x 15 -30 sec Lower trunk rotation x 10     [facility] Adult PT Treatment:                                                DATE: 09/29/22 Therapeutic Exercise: NuStep Level 5 , UE and LE for 6 min  Seated core/breathing with ball on thighs x 10  2 min walk test Supine Lower Trunk Rotation x 10  LTR with wide knees for hip ROM  Supine Transversus Abdominis Bracing  x 10  Supine march x 10 , pain LLE  Bent Knee Fallouts  -x  10 LLE pain  Hooklying Single Knee to Chest Stretch  - painful  Modalities: Cold pack 10 min                                                                                                                     DATE: 09/24/22   PT eval and Pt education.   Positioned in hooklying  with legs supported for breathing/Tr A x 10  Lower trunk rotation x 10  Bent knee fall out x 10 each LE  Knee to chest x 1 , 30 sec with towel supporting LE    PATIENT EDUCATION:  Education details: PT, POC, HEP, core/breathing, pool , sit to stand  Person educated: Patient Education method: Consulting civil engineer, Demonstration, Verbal cues, and Handouts Education comprehension: verbalized understanding and needs further education  HOME EXERCISE PROGRAM: Access Code: [email or web address] URL: [email or web address] Date: 09/24/2022 Prepared by: Raeford Razor  Exercises - Supine Lower Trunk Rotation  - 1-2 x daily - 7 x weekly - 2 sets - 10 reps - 15 hold - Bent Knee Fallouts  - 1-2 x daily - 7 x weekly - 2 sets - 10 reps - Supine Transversus Abdominis Bracing - Hands on Stomach  - 2-3 x daily - 7 x weekly - 2 sets - 10 reps - 5-10 hold - Hooklying Single Knee to Chest Stretch  - 1-2 x daily - 7 x weekly - 1 sets - 3 reps - 30 hold  ASSESSMENT:  CLINICAL IMPRESSION: Patient cont to have high levels of pain  Working on standing tolerance and core strength/stability today.  She has difficulty lifting her Rt leg (flexing hip)which is new for her, mostly due to pain.  Cont to progress as tolerated.  Radicular sx are more in LLE than Rt typically.   OBJECTIVE IMPAIRMENTS: cardiopulmonary status limiting activity, decreased activity tolerance, decreased endurance, decreased knowledge of use of DME, decreased mobility, difficulty walking, decreased ROM, decreased strength, increased edema, increased fascial restrictions, impaired flexibility, impaired sensation, improper body mechanics, postural dysfunction, obesity, and pain.   ACTIVITY LIMITATIONS: carrying, lifting, bending, sitting, standing, squatting, sleeping, stairs, transfers, bed mobility, locomotion level, and caring for others  PARTICIPATION LIMITATIONS: meal prep, cleaning, laundry, interpersonal relationship, driving, shopping, and  community activity  PERSONAL FACTORS: Time since onset of injury/illness/exacerbation and 3+ comorbidities: previous back surgery, obesity, diabetes   are also affecting patient's functional outcome.   REHAB POTENTIAL: Good  CLINICAL DECISION MAKING: Evolving/moderate complexity  EVALUATION COMPLEXITY: Moderate   GOALS: Goals reviewed with patient? Yes  SHORT TERM GOALS: Target date: 10/22/2022    Pt will be I with HEP for core, posture and LE strength/mobility  Baseline: Goal status: MET   2.  Pt will be able to complete functional assessment and set goal (sit to stand, gait)  Baseline: unable to complete due to knee pain  Goal status: MET   3.  Pt will be able to perform bed mobility and chair/mat transfers with no increase in pain  Baseline:  Goal status: MET   4.  Pt will be able to stand for 10 min for light housework with pain <6/10 Baseline: 8/10+ Goal status: INITIAL   LONG TERM GOALS: Target date: 11/19/2022    Pt will be able to improve FOTO score to 47% or greater to demo improved functional mobility.  Baseline: 36% Goal status: INITIAL  2.  Pt will be able to improve hip strength to 4+/5 or better for improved back support with transfers and gait  Baseline:  Goal status: INITIAL  3.  Pt will be able to walk in the grocery store for 30 min with min increase in pain from baseline, using cart for support  Baseline: mod to severe  Goal status: INITIAL  4.  Pt will be able to perform proper hip hinge to improve safety with light home tasks (vs bending) and improved spine stability  Baseline:  Goal status: INITIAL  5.  Pt will be I with HEP upon discharge  Baseline:  Goal status: INITIAL  6.  Pt will be able to walk 450 feet in 2 min walk test with knee pain no more than 6/10, no increase in back pain .  Baseline: 398, knee 8/10 Goal status: INITIAL  PLAN:  PT FREQUENCY: 2x/week  PT DURATION: 8 weeks  PLANNED INTERVENTIONS: Therapeutic exercises,  Therapeutic activity, Neuromuscular re-education, Balance training, Gait training, Patient/Family education, Self Care, Aquatic Therapy, Dry Needling, Electrical stimulation, Cryotherapy, Moist heat, Manual lymph drainage, Manual therapy, and Re-evaluation.  PLAN FOR NEXT SESSION : check goals. add to HEP.  Standing vs Seated core, Nustep.  Sit to stand , (functional testing)   Kahlyn Shippey, PT 10/06/2022, 3:07 PM   Raeford Razor, PT 10/06/22 3:07 PM Phone: [phone] Fax: [phone]

## 2022-10-07 NOTE — Therapy (Unsigned)
OUTPATIENT PHYSICAL THERAPY THORACOLUMBAR EVALUATION   Patient Name: Shannon [name] MRN: [id number] DOB:04-Jul-1956, 67 y.o., female Today's Date: 10/08/2022  END OF SESSION:  PT End of Session - 10/08/22 1454     Visit Number 5    Number of Visits 16    Date for PT Re-Evaluation 11/19/22    Authorization Type [employer] , MCD    PT Start Time 2956    PT Stop Time 1545    PT Time Calculation (min) 48 min    Activity Tolerance Patient tolerated treatment well    Behavior During Therapy WFL for tasks assessed/performed                 Past Medical History:  Diagnosis Date   Arthritis    Back pain    Constipation    Depression    Fibromyalgia    Headache    Herniated disc    Hypertension    Joint pain    Knee pain    Lower extremity edema    Pre-diabetes    Prediabetes    Restless leg syndrome    Sarcoidosis 2000   Sciatica    Scoliosis    Snoring    SOB (shortness of breath)    Vitamin D deficiency    Past Surgical History:  Procedure Laterality Date   ABDOMINAL HYSTERECTOMY  1999   APPENDECTOMY  2019   BACK SURGERY     BREAST BIOPSY Right 12/2016   LAPAROSCOPIC APPENDECTOMY N/A 10/28/2017   Procedure: APPENDECTOMY LAPAROSCOPIC;  Surgeon: Leighton Ruff, MD;  Location: [facility];  Service: General;  Laterality: N/A;   TUBAL LIGATION     Patient Active Problem List   Diagnosis Date Noted   Lumbar adjacent segment disease with spondylolisthesis 02/26/2022   S/P lumbar spinal fusion 06/28/2021   Spondylolisthesis of lumbar region 06/28/2021   Constipation 02/21/2021   Diabetes mellitus ([place]) 11/26/2020   Chronic pain syndrome 11/26/2020   Appendicitis 10/28/2017   Cigarette smoker 07/26/2015   Dyspnea 05/29/2015   BACK PAIN, LUMBAR 05/29/2008   SARCOIDOSIS 12/21/2007   FATIGUE, CHRONIC 12/21/2007   RECTAL BLEEDING 12/14/2007   NECK PAIN, ACUTE 12/14/2007   ABDOMINAL PAIN, GENERALIZED 12/14/2007   DEPRESSION 05/13/2007   Hypertension associated with  type 2 diabetes mellitus ([place]) 05/13/2007   PERIODONTAL DISEASE 05/13/2007   HEMATURIA 05/13/2007   ALLERGY 05/13/2007   Class 3 severe obesity with serious comorbidity and body mass index (BMI) of 40.0 to 44.9 in adult ([place]) 05/07/2007    PCP: Cletis Athens NP  REFERRING PROVIDER: Dr. Christella Noa   REFERRING DIAG: S/P Fusion, Spondylolisthesis  Rationale for Evaluation and Treatment: Rehabilitation  THERAPY DIAG:  Other low back pain  Radiculopathy, thoracolumbar region  Muscle weakness (generalized)  ONSET DATE: 02/26/2022  SUBJECTIVE:  SUBJECTIVE STATEMENT: Back is hurting I overdid it yesterday. Did laundry and was achy and tired.  I am  a 7/10 right now.   PERTINENT HISTORY:  Pre-diabetic, 06/28/2021: L4-L5 , L5-S1  Posterior lumbar interbody fusion  02/26/22:  L3-L4 PLIF See above   PAIN:  Are you having pain? Yes: NPRS scale: 7/10 Pain location: thoracolumbar spine and into posterior thighs  Pain description: vice turning  Aggravating factors: standing  Relieving factors: sitting and laying down  Ibuprofen, Tylenol, ice on back  PRECAUTIONS: None  WEIGHT BEARING RESTRICTIONS: No  FALLS:  Has patient fallen in last 6 months? No  LIVING ENVIRONMENT: Lives with: lives with their partner Lives in: House/apartment Stairs: No Has following equipment at home: None  OCCUPATION: Not working, was a patient transport Training and development officer)  PLOF: Independent  PATIENT GOALS: I want to feel better, get better and walk again. Lose wgt.   NEXT MD VISIT:   OBJECTIVE:   DIAGNOSTIC FINDINGS:  09/2022: CT   IMPRESSION:   1. Interval pedicle screw and interbody fusion at L3-4. Interval   development of bilateral pars defects at L3. No significant stenosis   2. Solid fusion L4-5 and L5-S1. Mild to  moderate subarticular and   foraminal stenosis bilaterally L5-S1 due to spurring.    PATIENT SURVEYS:  FOTO 36%  SCREENING FOR RED FLAGS: Bowel or bladder incontinence: No Spinal tumors: No Cauda equina syndrome: No Compression fracture: No Abdominal aneurysm: No  COGNITION: Overall cognitive status: Within functional limits for tasks assessed     SENSATION: Vague numbness in LEs, feet  MUSCLE LENGTH: NT    POSTURE: rounded shoulders, forward head, flexed trunk , and genu varus , knees flexed  PALPATION: L knee swollen> Rt.  Min tenderness in thoracolumbar spine Pain L L5-S1 and extending outward across superior glutes , Lt lateral hip and thigh   LUMBAR ROM:   AROM Eval 09/24/22  Flexion 80% limited pain  Extension 80% limited pain   Right lateral flexion 50% pain R side    Left lateral flexion 50% pain L side   Right rotation 25% min pain  Left rotation 25% min pain    (Blank rows = not tested)  LOWER EXTREMITY ROM:     Active  Right eval Left eval  Hip flexion    Hip extension    Hip abduction    Hip adduction    Hip internal rotation    Hip external rotation    Knee flexion 115 120+  Knee extension -10 -10  Ankle dorsiflexion    Ankle plantarflexion    Ankle inversion    Ankle eversion     (Blank rows = not tested)  LOWER EXTREMITY MMT:    MMT Right eval Left eval  Hip flexion 3-/5 3-/5  Hip extension NT NT   Hip abduction 3/5 3/5  Hip adduction    Hip internal rotation    Hip external rotation    Knee flexion 5/5 5/5  Knee extension 5/5 5/5  Ankle dorsiflexion 5/5 5/5  Ankle plantarflexion    Ankle inversion    Ankle eversion     (Blank rows = not tested)  LUMBAR SPECIAL TESTS:  Straight leg raise test: Positive Lt.   FUNCTIONAL TESTS:  5 times sit to stand: unable to complete > 2 reps due to pain in L back and bilateral knees, 2 reps about 30 sec with hands  09/29/22: 2 min walk test: 398 no rest break , o2 sats 97%  and HR  125, knee pain limited her- 8/10 pain  GAIT: Distance walked: 75 Assistive device utilized: None Level of assistance: Modified independence Comments: slow pace, antalgic gait    TODAY'S TREATMENT:      [facility] Adult PT Treatment:                                                DATE: 10/07/22 Therapeutic Exercise: Nustep L6 UE and UE for 6 min  Seated trunk flexion x 10 , x 3 laterals  Seated hamstring stretch 30 sec x 3 Supine LTR with physioball  Hamstring curl x 10 with ball  Bridge x 10  Supine core with Pilates circle press  March with and without green band  Unilateral bent knee fall out, double leg as well  Bridge in hooklying (back pain )  [facility] Adult PT Treatment:                                                DATE: 10/06/22 Therapeutic Exercise: Nustep L5 UE and LE for 6 min  Hip extension x 2 x 10  Hip Abduction x 2 x 10  High march x 10 Seated pigeon x 3 , 30 sec  Abdominal Bracing  (used dyna disc under pelvis) Added march, bent knee fall out and UE overhead lift (6 lbs ) about 10 reps each , cues throughout  Lower trunk rotation x 10  LAD each LE x 3-4 reps   [facility] Adult PT Treatment:                                                DATE: 10/01/22 Therapeutic Exercise: Nustep Level 5 UE and LE for 6 min  Standing core: shoulder extension and row x 10 core focus with Pilates springboard 1 yellow  Palloff press slastix x 10  Supine overhead narrow grip x 15 green band for maintaining trunk stability  Horizontal pull with ab set greenband  Ball squeeze x  10 with TrA  Anterior/pelvic tilt x 10  PPT with march x 10  Bridge (legs on bolster) x 10 unable to do this with knees bent , had to do this with knees extended (glute set)  Hamstring stretch 3 x 15 -30 sec Lower trunk rotation x 10     [facility] Adult PT Treatment:                                                DATE: 09/29/22 Therapeutic Exercise: NuStep Level 5 , UE and LE for 6 min  Seated core/breathing with ball on  thighs x 10  2 min walk test Supine Lower Trunk Rotation x 10  LTR with wide knees for hip ROM  Supine Transversus Abdominis Bracing  x 10  Supine march x 10 , pain LLE  Bent Knee Fallouts  -x  10 LLE pain  Hooklying Single Knee to Chest Stretch  - painful  Modalities: Cold pack  10 min                                                                                                                     DATE: 09/24/22   PT eval and Pt education.   Positioned in hooklying with legs supported for breathing/Tr A x 10  Lower trunk rotation x 10  Bent knee fall out x 10 each LE  Knee to chest x 1 , 30 sec with towel supporting LE    PATIENT EDUCATION:  Education details: PT, POC, HEP, core/breathing, pool , sit to stand  Person educated: Patient Education method: Consulting civil engineer, Demonstration, Verbal cues, and Handouts Education comprehension: verbalized understanding and needs further education  HOME EXERCISE PROGRAM: Access Code: [email or web address] URL: [email or web address] Date: 09/24/2022 Prepared by: Raeford Razor Access Code: [email or web address] URL: [email or web address] Date: 10/08/2022 Prepared by: Raeford Razor  Exercises - Supine Lower Trunk Rotation  - 1-2 x daily - 7 x weekly - 2 sets - 10 reps - 15 hold - Supine Transversus Abdominis Bracing - Hands on Stomach  - 2-3 x daily - 7 x weekly - 2 sets - 10 reps - 5-10 hold - Supine March  - 1 x daily - 7 x weekly - 2 sets - 10 reps - 5 hold - Hooklying Single Leg Bent Knee Fallouts with Resistance  - 1 x daily - 7 x weekly - 2 sets - 10 reps - 5 hold  ASSESSMENT:  CLINICAL IMPRESSION: Patient cont with have pain in central low back which is aggravated with standing, walking.  Used physioball to support legs in supine and for core, hip strength. Added to HEP today to increase core stability with bands. Cont POC.   OBJECTIVE IMPAIRMENTS: cardiopulmonary status limiting activity, decreased activity tolerance, decreased  endurance, decreased knowledge of use of DME, decreased mobility, difficulty walking, decreased ROM, decreased strength, increased edema, increased fascial restrictions, impaired flexibility, impaired sensation, improper body mechanics, postural dysfunction, obesity, and pain.   ACTIVITY LIMITATIONS: carrying, lifting, bending, sitting, standing, squatting, sleeping, stairs, transfers, bed mobility, locomotion level, and caring for others  PARTICIPATION LIMITATIONS: meal prep, cleaning, laundry, interpersonal relationship, driving, shopping, and community activity  PERSONAL FACTORS: Time since onset of injury/illness/exacerbation and 3+ comorbidities: previous back surgery, obesity, diabetes   are also affecting patient's functional outcome.   REHAB POTENTIAL: Good  CLINICAL DECISION MAKING: Evolving/moderate complexity  EVALUATION COMPLEXITY: Moderate   GOALS: Goals reviewed with patient? Yes  SHORT TERM GOALS: Target date: 10/22/2022    Pt will be I with HEP for core, posture and LE strength/mobility  Baseline: Goal status: MET   2.  Pt will be able to complete functional assessment and set goal (sit to stand, gait)  Baseline: unable to complete due to knee pain  Goal status: MET   3.  Pt will be able to perform bed mobility and chair/mat transfers with no increase in pain  Baseline:  Goal status: MET   4.  Pt will be able to stand for 10  min for light housework with pain <6/10 Baseline: 8/10+ Goal status: INITIAL   LONG TERM GOALS: Target date: 11/19/2022    Pt will be able to improve FOTO score to 47% or greater to demo improved functional mobility.  Baseline: 36% Goal status: INITIAL  2.  Pt will be able to improve hip strength to 4+/5 or better for improved back support with transfers and gait  Baseline:  Goal status: INITIAL  3.  Pt will be able to walk in the grocery store for 30 min with min increase in pain from baseline, using cart for support  Baseline: mod  to severe  Goal status: INITIAL  4.  Pt will be able to perform proper hip hinge to improve safety with light home tasks (vs bending) and improved spine stability  Baseline:  Goal status: INITIAL  5.  Pt will be I with HEP upon discharge  Baseline:  Goal status: INITIAL  6.  Pt will be able to walk 450 feet in 2 min walk test with knee pain no more than 6/10, no increase in back pain .  Baseline: 398, knee 8/10 Goal status: INITIAL  PLAN:  PT FREQUENCY: 2x/week  PT DURATION: 8 weeks  PLANNED INTERVENTIONS: Therapeutic exercises, Therapeutic activity, Neuromuscular re-education, Balance training, Gait training, Patient/Family education, Self Care, Aquatic Therapy, Dry Needling, Electrical stimulation, Cryotherapy, Moist heat, Manual lymph drainage, Manual therapy, and Re-evaluation.  PLAN FOR NEXT SESSION : check goals. add to HEP.  Standing vs Seated core, Nustep.  Sit to stand , (functional testing)   Lailana Shira, PT 10/08/2022, 3:01 PM   Raeford Razor, PT 10/08/22 3:01 PM Phone: [phone] Fax: [phone]

## 2022-10-08 ENCOUNTER — Ambulatory Visit: Payer: 59 | Admitting: Physical Therapy

## 2022-10-08 ENCOUNTER — Encounter: Payer: Self-pay | Admitting: Physical Therapy

## 2022-10-08 DIAGNOSIS — M5459 Other low back pain: Secondary | ICD-10-CM | POA: Diagnosis not present

## 2022-10-08 DIAGNOSIS — M6281 Muscle weakness (generalized): Secondary | ICD-10-CM

## 2022-10-08 DIAGNOSIS — M5415 Radiculopathy, thoracolumbar region: Secondary | ICD-10-CM

## 2022-10-13 NOTE — Therapy (Unsigned)
OUTPATIENT PHYSICAL THERAPY THORACOLUMBAR EVALUATION   Patient Name: Shannon [name] MRN: [id number] DOB:07-12-1956, 67 y.o., female Today's Date: 10/14/2022  END OF SESSION:  PT End of Session - 10/14/22 1139     Visit Number 6    Number of Visits 16    Date for PT Re-Evaluation 11/19/22    Authorization Type [employer] , MCD    PT Start Time [id number]    PT Stop Time 1230    PT Time Calculation (min) 45 min    Activity Tolerance Patient tolerated treatment well    Behavior During Therapy WFL for tasks assessed/performed                  Past Medical History:  Diagnosis Date   Arthritis    Back pain    Constipation    Depression    Fibromyalgia    Headache    Herniated disc    Hypertension    Joint pain    Knee pain    Lower extremity edema    Pre-diabetes    Prediabetes    Restless leg syndrome    Sarcoidosis 2000   Sciatica    Scoliosis    Snoring    SOB (shortness of breath)    Vitamin D deficiency    Past Surgical History:  Procedure Laterality Date   ABDOMINAL HYSTERECTOMY  1999   APPENDECTOMY  2019   BACK SURGERY     BREAST BIOPSY Right 12/2016   LAPAROSCOPIC APPENDECTOMY N/A 10/28/2017   Procedure: APPENDECTOMY LAPAROSCOPIC;  Surgeon: Leighton Ruff, MD;  Location: [facility];  Service: General;  Laterality: N/A;   TUBAL LIGATION     Patient Active Problem List   Diagnosis Date Noted   Lumbar adjacent segment disease with spondylolisthesis 02/26/2022   S/P lumbar spinal fusion 06/28/2021   Spondylolisthesis of lumbar region 06/28/2021   Constipation 02/21/2021   Diabetes mellitus ([place]) 11/26/2020   Chronic pain syndrome 11/26/2020   Appendicitis 10/28/2017   Cigarette smoker 07/26/2015   Dyspnea 05/29/2015   BACK PAIN, LUMBAR 05/29/2008   SARCOIDOSIS 12/21/2007   FATIGUE, CHRONIC 12/21/2007   RECTAL BLEEDING 12/14/2007   NECK PAIN, ACUTE 12/14/2007   ABDOMINAL PAIN, GENERALIZED 12/14/2007   DEPRESSION 05/13/2007   Hypertension associated with  type 2 diabetes mellitus ([place]) 05/13/2007   PERIODONTAL DISEASE 05/13/2007   HEMATURIA 05/13/2007   ALLERGY 05/13/2007   Class 3 severe obesity with serious comorbidity and body mass index (BMI) of 40.0 to 44.9 in adult ([place]) 05/07/2007    PCP: Cletis Athens NP  REFERRING PROVIDER: Dr. Christella Noa   REFERRING DIAG: S/P Fusion, Spondylolisthesis  Rationale for Evaluation and Treatment: Rehabilitation  THERAPY DIAG:  Other low back pain  Radiculopathy, thoracolumbar region  Muscle weakness (generalized)  ONSET DATE: 02/26/2022  SUBJECTIVE:  SUBJECTIVE STATEMENT: I am doing good. I have been trying to do my exercises .  When I get out of bed I struggle due to pain.  I got my weights out.   PERTINENT HISTORY:  Pre-diabetic, 06/28/2021: L4-L5 , L5-S1  Posterior lumbar interbody fusion  02/26/22:  L3-L4 PLIF See above   PAIN:  Are you having pain? Yes: NPRS scale: 6/10 Pain location: thoracolumbar spine and into posterior thighs  Pain description: vice, turning  Aggravating factors: standing  Relieving factors: sitting and laying down  Ibuprofen, Tylenol, ice on back  PRECAUTIONS: None  WEIGHT BEARING RESTRICTIONS: No  FALLS:  Has patient fallen in last 6 months? No  LIVING ENVIRONMENT: Lives with: lives with their partner Lives in: House/apartment Stairs: No Has following equipment at home: None  OCCUPATION: Not working, was a patient transport Training and development officer)  PLOF: Independent  PATIENT GOALS: I want to feel better, get better and walk again. Lose wgt.   NEXT MD VISIT:   OBJECTIVE:   DIAGNOSTIC FINDINGS:  09/2022: CT   IMPRESSION:   1. Interval pedicle screw and interbody fusion at L3-4. Interval   development of bilateral pars defects at L3. No significant stenosis   2. Solid  fusion L4-5 and L5-S1. Mild to moderate subarticular and   foraminal stenosis bilaterally L5-S1 due to spurring.    PATIENT SURVEYS:  FOTO 36%  SCREENING FOR RED FLAGS: Bowel or bladder incontinence: No Spinal tumors: No Cauda equina syndrome: No Compression fracture: No Abdominal aneurysm: No  COGNITION: Overall cognitive status: Within functional limits for tasks assessed     SENSATION: Vague numbness in LEs, feet  MUSCLE LENGTH: NT    POSTURE: rounded shoulders, forward head, flexed trunk , and genu varus , knees flexed  PALPATION: L knee swollen> Rt.  Min tenderness in thoracolumbar spine Pain L L5-S1 and extending outward across superior glutes , Lt lateral hip and thigh   LUMBAR ROM:   AROM Eval 09/24/22  Flexion 80% limited pain  Extension 80% limited pain   Right lateral flexion 50% pain R side    Left lateral flexion 50% pain L side   Right rotation 25% min pain  Left rotation 25% min pain    (Blank rows = not tested)  LOWER EXTREMITY ROM:     Active  Right eval Left eval  Hip flexion    Hip extension    Hip abduction    Hip adduction    Hip internal rotation    Hip external rotation    Knee flexion 115 120+  Knee extension -10 -10  Ankle dorsiflexion    Ankle plantarflexion    Ankle inversion    Ankle eversion     (Blank rows = not tested)  LOWER EXTREMITY MMT:    MMT Right eval Left eval  Hip flexion 3-/5 3-/5  Hip extension NT NT   Hip abduction 3/5 3/5  Hip adduction    Hip internal rotation    Hip external rotation    Knee flexion 5/5 5/5  Knee extension 5/5 5/5  Ankle dorsiflexion 5/5 5/5  Ankle plantarflexion    Ankle inversion    Ankle eversion     (Blank rows = not tested)  LUMBAR SPECIAL TESTS:  Straight leg raise test: Positive Lt.   FUNCTIONAL TESTS:  5 times sit to stand: unable to complete > 2 reps due to pain in L back and bilateral knees, 2 reps about 30 sec with hands  09/29/22: 2 min walk  test: 398 no  rest break , o2 sats 97% and HR 125, knee pain limited her- 8/10 pain  GAIT: Distance walked: 75 Assistive device utilized: None Level of assistance: Modified independence Comments: slow pace, antalgic gait    TODAY'S TREATMENT:       [facility] Adult PT Treatment:                                                DATE: 10/14/22 Therapeutic Exercise: NuStep L4 UE and LE for 6 min   Spine mobility in multiple positions for NM re-education  Seated on physio ball hands on table PPT Alt. UE lift then double arm  Upper arm rotation x 5 Standing core press with physioball on high mat x 10  Heel raise x 10 holding ball  Hip extension high mat  x 15  Therapeutic Activity: Bed mobility supine to sit to supine   [facility] Adult PT Treatment:                                                DATE: 10/07/22 Therapeutic Exercise: Nustep L6 UE and UE for 6 min  Seated trunk flexion x 10 , x 3 laterals  Seated hamstring stretch 30 sec x 3 Supine LTR with physioball  Hamstring curl x 10 with ball  Bridge x 10  Supine core with Pilates circle press  March with and without green band  Unilateral bent knee fall out, double leg as well  Bridge in hooklying (back pain )  [facility] Adult PT Treatment:                                                DATE: 10/06/22 Therapeutic Exercise: Nustep L5 UE and LE for 6 min  Hip extension x 2 x 10  Hip Abduction x 2 x 10  High march x 10 Seated pigeon x 3 , 30 sec  Abdominal Bracing  (used dyna disc under pelvis) Added march, bent knee fall out and UE overhead lift (6 lbs ) about 10 reps each , cues throughout  Lower trunk rotation x 10  LAD each LE x 3-4 reps   [facility] Adult PT Treatment:                                                DATE: 10/01/22 Therapeutic Exercise: Nustep Level 5 UE and LE for 6 min  Standing core: shoulder extension and row x 10 core focus with Pilates springboard 1 yellow  Palloff press slastix x 10  Supine overhead narrow grip x 15 green band for  maintaining trunk stability  Horizontal pull with ab set greenband  Ball squeeze x  10 with TrA  Anterior/pelvic tilt x 10  PPT with march x 10  Bridge (legs on bolster) x 10 unable to do this with knees bent , had to do this with knees extended (glute set)  Hamstring stretch 3 x 15 -30 sec Lower  trunk rotation x 10     [facility] Adult PT Treatment:                                                DATE: 09/29/22 Therapeutic Exercise: NuStep Level 5 , UE and LE for 6 min  Seated core/breathing with ball on thighs x 10  2 min walk test Supine Lower Trunk Rotation x 10  LTR with wide knees for hip ROM  Supine Transversus Abdominis Bracing  x 10  Supine march x 10 , pain LLE  Bent Knee Fallouts  -x  10 LLE pain  Hooklying Single Knee to Chest Stretch  - painful  Modalities: Cold pack 10 min                                                                                                                     DATE: 09/24/22   PT eval and Pt education.   Positioned in hooklying with legs supported for breathing/Tr A x 10  Lower trunk rotation x 10  Bent knee fall out x 10 each LE  Knee to chest x 1 , 30 sec with towel supporting LE    PATIENT EDUCATION:  Education details: PT, POC, HEP, core/breathing, pool , sit to stand  Person educated: Patient Education method: Consulting civil engineer, Demonstration, Verbal cues, and Handouts Education comprehension: verbalized understanding and needs further education  HOME EXERCISE PROGRAM: Access Code: [email or web address] URL: [email or web address] Date: 09/24/2022 Prepared by: Raeford Razor Access Code: [email or web address] URL: [email or web address] Date: 10/08/2022 Prepared by: Raeford Razor  Exercises - Supine Lower Trunk Rotation  - 1-2 x daily - 7 x weekly - 2 sets - 10 reps - 15 hold - Supine Transversus Abdominis Bracing - Hands on Stomach  - 2-3 x daily - 7 x weekly - 2 sets - 10 reps - 5-10 hold - Supine March  - 1 x daily - 7 x weekly - 2  sets - 10 reps - 5 hold - Hooklying Single Leg Bent Knee Fallouts with Resistance  - 1 x daily - 7 x weekly - 2 sets - 10 reps - 5 hold  ASSESSMENT:  CLINICAL IMPRESSION: Patient reports with moderate amount of pain today.  Continuing to work on core strength spine mobility and breathing to facilitate movement.  She has significant difficulty with spine mobility.  We chose to cancel her aquatics appointments as she does report a fear of water and does not feel herself doing water exercise after discharge.  She works hard during her session despite pain.  Offered soft tissue work but she reports in the past it has been ineffective and actually increases her pain post.  Will continue to provide exercises to maximize functional mobility and self care education and encouragement. OBJECTIVE IMPAIRMENTS: cardiopulmonary status limiting activity, decreased activity tolerance, decreased endurance, decreased knowledge of use of DME, decreased mobility, difficulty  walking, decreased ROM, decreased strength, increased edema, increased fascial restrictions, impaired flexibility, impaired sensation, improper body mechanics, postural dysfunction, obesity, and pain.   ACTIVITY LIMITATIONS: carrying, lifting, bending, sitting, standing, squatting, sleeping, stairs, transfers, bed mobility, locomotion level, and caring for others  PARTICIPATION LIMITATIONS: meal prep, cleaning, laundry, interpersonal relationship, driving, shopping, and community activity  PERSONAL FACTORS: Time since onset of injury/illness/exacerbation and 3+ comorbidities: previous back surgery, obesity, diabetes   are also affecting patient's functional outcome.   REHAB POTENTIAL: Good  CLINICAL DECISION MAKING: Evolving/moderate complexity  EVALUATION COMPLEXITY: Moderate   GOALS: Goals reviewed with patient? Yes  SHORT TERM GOALS: Target date: 10/22/2022    Pt will be I with HEP for core, posture and LE strength/mobility   Baseline: Goal status: MET   2.  Pt will be able to complete functional assessment and set goal (sit to stand, gait)  Baseline: unable to complete due to knee pain  Goal status: MET   3.  Pt will be able to perform bed mobility and chair/mat transfers with no increase in pain  Baseline:  Goal status: MET   4.  Pt will be able to stand for 10 min for light housework with pain <6/10 Baseline: 8/10+ Goal status: INITIAL   LONG TERM GOALS: Target date: 11/19/2022    Pt will be able to improve FOTO score to 47% or greater to demo improved functional mobility.  Baseline: 36% Goal status: INITIAL  2.  Pt will be able to improve hip strength to 4+/5 or better for improved back support with transfers and gait  Baseline:  Goal status: INITIAL  3.  Pt will be able to walk in the grocery store for 30 min with min increase in pain from baseline, using cart for support  Baseline: mod to severe  Goal status: INITIAL  4.  Pt will be able to perform proper hip hinge to improve safety with light home tasks (vs bending) and improved spine stability  Baseline:  Goal status: INITIAL  5.  Pt will be I with HEP upon discharge  Baseline:  Goal status: INITIAL  6.  Pt will be able to walk 450 feet in 2 min walk test with knee pain no more than 6/10, no increase in back pain .  Baseline: 398, knee 8/10 Goal status: INITIAL  PLAN:  PT FREQUENCY: 2x/week  PT DURATION: 8 weeks  PLANNED INTERVENTIONS: Therapeutic exercises, Therapeutic activity, Neuromuscular re-education, Balance training, Gait training, Patient/Family education, Self Care, Aquatic Therapy, Dry Needling, Electrical stimulation, Cryotherapy, Moist heat, Manual lymph drainage, Manual therapy, and Re-evaluation.  PLAN FOR NEXT SESSION : check goals. add to HEP.  Standing vs Seated core, Nustep.  Sit to stand , (functional testing)   Cartrell Bentsen, PT 10/14/2022, 12:29 PM   Raeford Razor, PT 10/14/22 12:29 PM Phone:  [phone] Fax: [phone]

## 2022-10-14 ENCOUNTER — Encounter: Payer: Self-pay | Admitting: Physical Therapy

## 2022-10-14 ENCOUNTER — Ambulatory Visit: Payer: 59 | Admitting: Physical Therapy

## 2022-10-14 DIAGNOSIS — M5415 Radiculopathy, thoracolumbar region: Secondary | ICD-10-CM

## 2022-10-14 DIAGNOSIS — M5459 Other low back pain: Secondary | ICD-10-CM | POA: Diagnosis not present

## 2022-10-14 DIAGNOSIS — M6281 Muscle weakness (generalized): Secondary | ICD-10-CM

## 2022-10-15 ENCOUNTER — Ambulatory Visit: Payer: 59 | Admitting: Physical Therapy

## 2022-10-19 NOTE — Therapy (Unsigned)
OUTPATIENT PHYSICAL THERAPY THORACOLUMBAR EVALUATION   Patient Name: Shannon [name] MRN: [id number] DOB:[birth year], 67 y.o., female Today's Date: 10/20/2022  END OF SESSION:  PT End of Session - 10/20/22 1440     Visit Number 7    Number of Visits 16    Date for PT Re-Evaluation 11/19/22    Authorization Type UHC , MCD    PT Start Time [id number]    PT Stop Time 1500    PT Time Calculation (min) 39 min    Activity Tolerance Patient tolerated treatment well    Behavior During Therapy WFL for tasks assessed/performed                   Past Medical History:  Diagnosis Date   Arthritis    Back pain    Constipation    Depression    Fibromyalgia    Headache    Herniated disc    Hypertension    Joint pain    Knee pain    Lower extremity edema    Pre-diabetes    Prediabetes    Restless leg syndrome    Sarcoidosis 2000   Sciatica    Scoliosis    Snoring    SOB (shortness of breath)    Vitamin D deficiency    Past Surgical History:  Procedure Laterality Date   ABDOMINAL HYSTERECTOMY  1999   APPENDECTOMY  2019   BACK SURGERY     BREAST BIOPSY Right 12/2016   LAPAROSCOPIC APPENDECTOMY N/A 10/28/2017   Procedure: APPENDECTOMY LAPAROSCOPIC;  Surgeon: Leighton Ruff, MD;  Location: [facility];  Service: General;  Laterality: N/A;   TUBAL LIGATION     Patient Active Problem List   Diagnosis Date Noted   Lumbar adjacent segment disease with spondylolisthesis 02/26/2022   S/P lumbar spinal fusion 06/28/2021   Spondylolisthesis of lumbar region 06/28/2021   Constipation 02/21/2021   Diabetes mellitus ([place]) 11/26/2020   Chronic pain syndrome 11/26/2020   Appendicitis 10/28/2017   Cigarette smoker 07/26/2015   Dyspnea 05/29/2015   BACK PAIN, LUMBAR 05/29/2008   SARCOIDOSIS 12/21/2007   FATIGUE, CHRONIC 12/21/2007   RECTAL BLEEDING 12/14/2007   NECK PAIN, ACUTE 12/14/2007   ABDOMINAL PAIN, GENERALIZED 12/14/2007   DEPRESSION 05/13/2007   Hypertension associated  with type 2 diabetes mellitus ([place]) 05/13/2007   PERIODONTAL DISEASE 05/13/2007   HEMATURIA 05/13/2007   ALLERGY 05/13/2007   Class 3 severe obesity with serious comorbidity and body mass index (BMI) of 40.0 to 44.9 in adult ([place]) 05/07/2007    PCP: Cletis Athens NP  REFERRING PROVIDER: Dr. Christella Noa   REFERRING DIAG: S/P Fusion, Spondylolisthesis  Rationale for Evaluation and Treatment: Rehabilitation  THERAPY DIAG:  Other low back pain  Radiculopathy, thoracolumbar region  Muscle weakness (generalized)  ONSET DATE: 02/26/2022  SUBJECTIVE:  SUBJECTIVE STATEMENT: I did a lot yesterday, cleaning up, ,laundry etc.  I walked to the store 2 days in a row.  15 min at a time. Back wasn't too bad.    PERTINENT HISTORY:  Pre-diabetic, 06/28/2021: L4-L5 , L5-S1  Posterior lumbar interbody fusion  02/26/22:  L3-L4 PLIF See above   PAIN:  Are you having pain? Yes: NPRS scale: 5/10 Pain location: thoracolumbar spine and into posterior thighs  Pain description: vice, turning  Aggravating factors: standing  Relieving factors: sitting and laying down  Ibuprofen, Tylenol, ice on back  PRECAUTIONS: None  WEIGHT BEARING RESTRICTIONS: No  FALLS:  Has patient fallen in last 6 months? No  LIVING ENVIRONMENT: Lives with: lives with their partner Lives in: House/apartment Stairs: No Has following equipment at home: None  OCCUPATION: Not working, was a patient transport Training and development officer)  PLOF: Independent  PATIENT GOALS: I want to feel better, get better and walk again. Lose wgt.   NEXT MD VISIT:   OBJECTIVE:   DIAGNOSTIC FINDINGS:  09/2022: CT   IMPRESSION:   1. Interval pedicle screw and interbody fusion at L3-4. Interval   development of bilateral pars defects at L3. No significant stenosis   2.  Solid fusion L4-5 and L5-S1. Mild to moderate subarticular and   foraminal stenosis bilaterally L5-S1 due to spurring.    PATIENT SURVEYS:  FOTO 36%  SCREENING FOR RED FLAGS: Bowel or bladder incontinence: No Spinal tumors: No Cauda equina syndrome: No Compression fracture: No Abdominal aneurysm: No  COGNITION: Overall cognitive status: Within functional limits for tasks assessed     SENSATION: Vague numbness in LEs, feet  MUSCLE LENGTH: NT    POSTURE: rounded shoulders, forward head, flexed trunk , and genu varus , knees flexed  PALPATION: L knee swollen> Rt.  Min tenderness in thoracolumbar spine Pain L L5-S1 and extending outward across superior glutes , Lt lateral hip and thigh   LUMBAR ROM:   AROM Eval 09/24/22  Flexion 80% limited pain  Extension 80% limited pain   Right lateral flexion 50% pain R side    Left lateral flexion 50% pain L side   Right rotation 25% min pain  Left rotation 25% min pain    (Blank rows = not tested)  LOWER EXTREMITY ROM:     Active  Right eval Left eval  Hip flexion    Hip extension    Hip abduction    Hip adduction    Hip internal rotation    Hip external rotation    Knee flexion 115 120+  Knee extension -10 -10  Ankle dorsiflexion    Ankle plantarflexion    Ankle inversion    Ankle eversion     (Blank rows = not tested)  LOWER EXTREMITY MMT:    MMT Right eval Left eval  Hip flexion 3-/5 3-/5  Hip extension NT NT   Hip abduction 3/5 3/5  Hip adduction    Hip internal rotation    Hip external rotation    Knee flexion 5/5 5/5  Knee extension 5/5 5/5  Ankle dorsiflexion 5/5 5/5  Ankle plantarflexion    Ankle inversion    Ankle eversion     (Blank rows = not tested)  LUMBAR SPECIAL TESTS:  Straight leg raise test: Positive Lt.   FUNCTIONAL TESTS:  5 times sit to stand: unable to complete > 2 reps due to pain in L back and bilateral knees, 2 reps about 30 sec with hands  09/29/22: 2 min walk  test: 398  no rest break , o2 sats 97% and HR 125, knee pain limited her- 8/10 pain  GAIT: Distance walked: 75 Assistive device utilized: None Level of assistance: Modified independence Comments: slow pace, antalgic gait    TODAY'S TREATMENT:       [facility] Adult PT Treatment:                                                DATE: 10/20/22 Therapeutic Exercise: NuStep L5 UE and LE , 7 min  Physioball: LTR x 10, hamstring curls  x 15 and bridges Hamstring stretch 3 x 20 sec  Piriformis stretch with towel to assist  Sit to stand 10 lbs x 8, then x 10 without wgt per pt request Hip hinge with blue band on feet  x 15 , min cues  [facility] Adult PT Treatment:                                                DATE: 10/14/22 Therapeutic Exercise: NuStep L4 UE and LE for 6 min   Spine mobility in multiple positions for NM re-education  Seated on physio ball hands on table PPT Alt. UE lift then double arm  Upper arm rotation x 5 Standing core press with physioball on high mat x 10  Heel raise x 10 holding ball  Hip extension high mat  x 15  Therapeutic Activity: Bed mobility supine to sit to supine   [facility] Adult PT Treatment:                                                DATE: 10/07/22 Therapeutic Exercise: Nustep L6 UE and UE for 6 min  Seated trunk flexion x 10 , x 3 laterals  Seated hamstring stretch 30 sec x 3 Supine LTR with physioball  Hamstring curl x 10 with ball  Bridge x 10  Supine core with Pilates circle press  March with and without green band  Unilateral bent knee fall out, double leg as well  Bridge in hooklying (back pain )   PT eval and Pt education.   Positioned in hooklying with legs supported for breathing/Tr A x 10  Lower trunk rotation x 10  Bent knee fall out x 10 each LE  Knee to chest x 1 , 30 sec with towel supporting LE    PATIENT EDUCATION:  Education details: PT, POC, HEP, core/breathing, pool , sit to stand  Person educated: Patient Education method: Consulting civil engineer,  Demonstration, Verbal cues, and Handouts Education comprehension: verbalized understanding and needs further education  HOME EXERCISE PROGRAM: Access Code: [email or web address] URL: [email or web address] Date: 09/24/2022 Prepared by: Raeford Razor  ASSESSMENT:  CLINICAL IMPRESSION: Patient continues to be limited by lower back pain with extension into her left hip.  She tolerated the exercises well other than sit to stand some mild knee pain.  Adding 10 pounds increase her back pain.  She was able to hinge effectively and not increase her knee pain.  Continue to challenge tolerance for exercise and provide education, core stability.     OBJECTIVE IMPAIRMENTS:  cardiopulmonary status limiting activity, decreased activity tolerance, decreased endurance, decreased knowledge of use of DME, decreased mobility, difficulty walking, decreased ROM, decreased strength, increased edema, increased fascial restrictions, impaired flexibility, impaired sensation, improper body mechanics, postural dysfunction, obesity, and pain.   ACTIVITY LIMITATIONS: carrying, lifting, bending, sitting, standing, squatting, sleeping, stairs, transfers, bed mobility, locomotion level, and caring for others  PARTICIPATION LIMITATIONS: meal prep, cleaning, laundry, interpersonal relationship, driving, shopping, and community activity  PERSONAL FACTORS: Time since onset of injury/illness/exacerbation and 3+ comorbidities: previous back surgery, obesity, diabetes   are also affecting patient's functional outcome.   REHAB POTENTIAL: Good  CLINICAL DECISION MAKING: Evolving/moderate complexity  EVALUATION COMPLEXITY: Moderate   GOALS: Goals reviewed with patient? Yes  SHORT TERM GOALS: Target date: 10/22/2022    Pt will be I with HEP for core, posture and LE strength/mobility  Baseline: Goal status: MET   2.  Pt will be able to complete functional assessment and set goal (sit to stand, gait)  Baseline: unable to  complete due to knee pain  Goal status: MET   3.  Pt will be able to perform bed mobility and chair/mat transfers with no increase in pain  Baseline:  Goal status: MET   4.  Pt will be able to stand for 10 min for light housework with pain <6/10 Baseline: 8/10+ Goal status: INITIAL   LONG TERM GOALS: Target date: 11/19/2022    Pt will be able to improve FOTO score to 47% or greater to demo improved functional mobility.  Baseline: 36% Goal status: INITIAL  2.  Pt will be able to improve hip strength to 4+/5 or better for improved back support with transfers and gait  Baseline:  Goal status: INITIAL  3.  Pt will be able to walk in the grocery store for 30 min with min increase in pain from baseline, using cart for support  Baseline: mod to severe  Goal status: INITIAL  4.  Pt will be able to perform proper hip hinge to improve safety with light home tasks (vs bending) and improved spine stability  Baseline:  Goal status: INITIAL  5.  Pt will be I with HEP upon discharge  Baseline:  Goal status: INITIAL  6.  Pt will be able to walk 450 feet in 2 min walk test with knee pain no more than 6/10, no increase in back pain .  Baseline: 398, knee 8/10 Goal status: INITIAL  PLAN:  PT FREQUENCY: 2x/week  PT DURATION: 8 weeks  PLANNED INTERVENTIONS: Therapeutic exercises, Therapeutic activity, Neuromuscular re-education, Balance training, Gait training, Patient/Family education, Self Care, Aquatic Therapy, Dry Needling, Electrical stimulation, Cryotherapy, Moist heat, Manual lymph drainage, Manual therapy, and Re-evaluation.  PLAN FOR NEXT SESSION : check goals. add to HEP.  Standing vs Seated core, Nustep.  Sit to stand , (functional testing)   Andrena Margerum, PT 10/20/2022, 7:16 PM   Raeford Razor, PT 10/20/22 7:16 PM Phone: [phone] Fax: [phone]

## 2022-10-20 ENCOUNTER — Ambulatory Visit: Payer: 59 | Admitting: Physical Therapy

## 2022-10-20 ENCOUNTER — Encounter: Payer: Self-pay | Admitting: Physical Therapy

## 2022-10-20 DIAGNOSIS — M5415 Radiculopathy, thoracolumbar region: Secondary | ICD-10-CM

## 2022-10-20 DIAGNOSIS — M5459 Other low back pain: Secondary | ICD-10-CM

## 2022-10-20 DIAGNOSIS — M6281 Muscle weakness (generalized): Secondary | ICD-10-CM

## 2022-10-22 ENCOUNTER — Ambulatory Visit: Payer: 59 | Admitting: Physical Therapy

## 2022-10-22 ENCOUNTER — Ambulatory Visit: Payer: 59

## 2022-10-22 DIAGNOSIS — M6281 Muscle weakness (generalized): Secondary | ICD-10-CM

## 2022-10-22 DIAGNOSIS — M5459 Other low back pain: Secondary | ICD-10-CM

## 2022-10-22 DIAGNOSIS — M5415 Radiculopathy, thoracolumbar region: Secondary | ICD-10-CM

## 2022-10-22 NOTE — Therapy (Signed)
OUTPATIENT PHYSICAL THERAPY THORACOLUMBAR EVALUATION   Patient Name: Shannon [name] MRN: [id number] DOB:October 03, 1955, 67 y.o., female Today's Date: 10/22/2022  END OF SESSION:  PT End of Session - 10/22/22 1432     Visit Number 8    Number of Visits 16    Date for PT Re-Evaluation 11/19/22    Authorization Type UHC , MCD    PT Start Time 1440    PT Stop Time 1520    PT Time Calculation (min) 40 min    Activity Tolerance Patient tolerated treatment well    Behavior During Therapy WFL for tasks assessed/performed              Past Medical History:  Diagnosis Date   Arthritis    Back pain    Constipation    Depression    Fibromyalgia    Headache    Herniated disc    Hypertension    Joint pain    Knee pain    Lower extremity edema    Pre-diabetes    Prediabetes    Restless leg syndrome    Sarcoidosis 2000   Sciatica    Scoliosis    Snoring    SOB (shortness of breath)    Vitamin D deficiency    Past Surgical History:  Procedure Laterality Date   ABDOMINAL HYSTERECTOMY  1999   APPENDECTOMY  2019   BACK SURGERY     BREAST BIOPSY Right 12/2016   LAPAROSCOPIC APPENDECTOMY N/A 10/28/2017   Procedure: APPENDECTOMY LAPAROSCOPIC;  Surgeon: Leighton Ruff, MD;  Location: [facility];  Service: General;  Laterality: N/A;   TUBAL LIGATION     Patient Active Problem List   Diagnosis Date Noted   Lumbar adjacent segment disease with spondylolisthesis 02/26/2022   S/P lumbar spinal fusion 06/28/2021   Spondylolisthesis of lumbar region 06/28/2021   Constipation 02/21/2021   Diabetes mellitus ([place]) 11/26/2020   Chronic pain syndrome 11/26/2020   Appendicitis 10/28/2017   Cigarette smoker 07/26/2015   Dyspnea 05/29/2015   BACK PAIN, LUMBAR 05/29/2008   SARCOIDOSIS 12/21/2007   FATIGUE, CHRONIC 12/21/2007   RECTAL BLEEDING 12/14/2007   NECK PAIN, ACUTE 12/14/2007   ABDOMINAL PAIN, GENERALIZED 12/14/2007   DEPRESSION 05/13/2007   Hypertension associated with type 2  diabetes mellitus ([place]) 05/13/2007   PERIODONTAL DISEASE 05/13/2007   HEMATURIA 05/13/2007   ALLERGY 05/13/2007   Class 3 severe obesity with serious comorbidity and body mass index (BMI) of 40.0 to 44.9 in adult ([place]) 05/07/2007    PCP: Cletis Athens NP  REFERRING PROVIDER: Dr. Christella Noa   REFERRING DIAG: S/P Fusion, Spondylolisthesis  Rationale for Evaluation and Treatment: Rehabilitation  THERAPY DIAG:  Other low back pain  Radiculopathy, thoracolumbar region  Muscle weakness (generalized)  ONSET DATE: 02/26/2022  SUBJECTIVE:  SUBJECTIVE STATEMENT: Patient reports that her pain persists, especially when she's doing housework "I do a little bit, then take a break, then do more."  PERTINENT HISTORY:  Pre-diabetic, 06/28/2021: L4-L5 , L5-S1  Posterior lumbar interbody fusion  02/26/22:  L3-L4 PLIF See above   PAIN:  Are you having pain? Yes: NPRS scale: 7/10 Pain location: thoracolumbar spine and into posterior thighs  Pain description: vice, turning  Aggravating factors: standing  Relieving factors: sitting and laying down Ibuprofen, Tylenol, ice on back  PRECAUTIONS: None  WEIGHT BEARING RESTRICTIONS: No  FALLS:  Has patient fallen in last 6 months? No  LIVING ENVIRONMENT: Lives with: lives with their partner Lives in: House/apartment Stairs: No Has following equipment at home: None  OCCUPATION: Not working, was a patient transport Training and development officer)  PLOF: Independent  PATIENT GOALS: I want to feel better, get better and walk again. Lose wgt.   NEXT MD VISIT:   OBJECTIVE:   DIAGNOSTIC FINDINGS:  09/2022: CT   IMPRESSION:   1. Interval pedicle screw and interbody fusion at L3-4. Interval   development of bilateral pars defects at L3. No significant stenosis   2. Solid fusion  L4-5 and L5-S1. Mild to moderate subarticular and   foraminal stenosis bilaterally L5-S1 due to spurring.    PATIENT SURVEYS:  FOTO 36%  SCREENING FOR RED FLAGS: Bowel or bladder incontinence: No Spinal tumors: No Cauda equina syndrome: No Compression fracture: No Abdominal aneurysm: No  COGNITION: Overall cognitive status: Within functional limits for tasks assessed     SENSATION: Vague numbness in LEs, feet  MUSCLE LENGTH: NT    POSTURE: rounded shoulders, forward head, flexed trunk , and genu varus , knees flexed  PALPATION: L knee swollen> Rt.  Min tenderness in thoracolumbar spine Pain L L5-S1 and extending outward across superior glutes , Lt lateral hip and thigh   LUMBAR ROM:   AROM Eval 09/24/22  Flexion 80% limited pain  Extension 80% limited pain   Right lateral flexion 50% pain R side    Left lateral flexion 50% pain L side   Right rotation 25% min pain  Left rotation 25% min pain    (Blank rows = not tested)  LOWER EXTREMITY ROM:     Active  Right eval Left eval  Hip flexion    Hip extension    Hip abduction    Hip adduction    Hip internal rotation    Hip external rotation    Knee flexion 115 120+  Knee extension -10 -10  Ankle dorsiflexion    Ankle plantarflexion    Ankle inversion    Ankle eversion     (Blank rows = not tested)  LOWER EXTREMITY MMT:    MMT Right eval Left eval  Hip flexion 3-/5 3-/5  Hip extension NT NT   Hip abduction 3/5 3/5  Hip adduction    Hip internal rotation    Hip external rotation    Knee flexion 5/5 5/5  Knee extension 5/5 5/5  Ankle dorsiflexion 5/5 5/5  Ankle plantarflexion    Ankle inversion    Ankle eversion     (Blank rows = not tested)  LUMBAR SPECIAL TESTS:  Straight leg raise test: Positive Lt.   FUNCTIONAL TESTS:  5 times sit to stand: unable to complete > 2 reps due to pain in L back and bilateral knees, 2 reps about 30 sec with hands  09/29/22: 2 min walk test: 398 no rest  break , o2 sats 97%  and HR 125, knee pain limited her- 8/10 pain  GAIT: Distance walked: 75 Assistive device utilized: None Level of assistance: Modified independence Comments: slow pace, antalgic gait    TODAY'S TREATMENT:     [facility] Adult PT Treatment:                                                DATE: 10/22/22 Therapeutic Exercise: NuStep L5 UE and LE x 7 min  Physioball: LTR x 10, hamstring curls  x 15  Bridge x10 Sit to stand no weight today x10 - pain in knees and back Supine PPT with alternating BKFO BlueTB x10 - cues for pacing and sequencing    [facility] Adult PT Treatment:                                                DATE: 10/20/22 Therapeutic Exercise: NuStep L5 UE and LE , 7 min  Physioball: LTR x 10, hamstring curls  x 15 and bridges Hamstring stretch 3 x 20 sec  Piriformis stretch with towel to assist  Sit to stand 10 lbs x 8, then x 10 without wgt per pt request Hip hinge with blue band on feet  x 15 , min cues    [facility] Adult PT Treatment:                                                DATE: 10/14/22 Therapeutic Exercise: NuStep L4 UE and LE for 6 min   Spine mobility in multiple positions for NM re-education  Seated on physio ball hands on table PPT Alt. UE lift then double arm  Upper arm rotation x 5 Standing core press with physioball on high mat x 10  Heel raise x 10 holding ball  Hip extension high mat  x 15  Therapeutic Activity: Bed mobility supine to sit to supine    PATIENT EDUCATION:  Education details: PT, POC, HEP, core/breathing, pool , sit to stand  Person educated: Patient Education method: Consulting civil engineer, Demonstration, Verbal cues, and Handouts Education comprehension: verbalized understanding and needs further education  HOME EXERCISE PROGRAM: Access Code: [email or web address] URL: [email or web address] Date: 09/24/2022 Prepared by: Raeford Razor  ASSESSMENT:  CLINICAL IMPRESSION: Patient presents to PT reporting continued back pain that  limits her household activities. Session today continued to focus on core, proximal hip, and knee strengthening. She has some limitation from pain throughout session and moves slowly, but is able to tolerate all exercises today. Patient continues to benefit from skilled PT services and should be progressed as able to improve functional independence.    OBJECTIVE IMPAIRMENTS: cardiopulmonary status limiting activity, decreased activity tolerance, decreased endurance, decreased knowledge of use of DME, decreased mobility, difficulty walking, decreased ROM, decreased strength, increased edema, increased fascial restrictions, impaired flexibility, impaired sensation, improper body mechanics, postural dysfunction, obesity, and pain.   ACTIVITY LIMITATIONS: carrying, lifting, bending, sitting, standing, squatting, sleeping, stairs, transfers, bed mobility, locomotion level, and caring for others  PARTICIPATION LIMITATIONS: meal prep, cleaning, laundry, interpersonal relationship, driving, shopping, and community activity  PERSONAL FACTORS: Time since onset of injury/illness/exacerbation and  3+ comorbidities: previous back surgery, obesity, diabetes   are also affecting patient's functional outcome.   REHAB POTENTIAL: Good  CLINICAL DECISION MAKING: Evolving/moderate complexity  EVALUATION COMPLEXITY: Moderate   GOALS: Goals reviewed with patient? Yes  SHORT TERM GOALS: Target date: 10/22/2022    Pt will be I with HEP for core, posture and LE strength/mobility  Baseline: Goal status: MET   2.  Pt will be able to complete functional assessment and set goal (sit to stand, gait)  Baseline: unable to complete due to knee pain  Goal status: MET   3.  Pt will be able to perform bed mobility and chair/mat transfers with no increase in pain  Baseline:  Goal status: MET   4.  Pt will be able to stand for 10 min for light housework with pain <6/10 Baseline: 8/10+ Goal status: Ongoing   LONG  TERM GOALS: Target date: 11/19/2022    Pt will be able to improve FOTO score to 47% or greater to demo improved functional mobility.  Baseline: 36% Goal status: INITIAL  2.  Pt will be able to improve hip strength to 4+/5 or better for improved back support with transfers and gait  Baseline:  Goal status: INITIAL  3.  Pt will be able to walk in the grocery store for 30 min with min increase in pain from baseline, using cart for support  Baseline: mod to severe  Goal status: INITIAL  4.  Pt will be able to perform proper hip hinge to improve safety with light home tasks (vs bending) and improved spine stability  Baseline:  Goal status: INITIAL  5.  Pt will be I with HEP upon discharge  Baseline:  Goal status: INITIAL  6.  Pt will be able to walk 450 feet in 2 min walk test with knee pain no more than 6/10, no increase in back pain .  Baseline: 398, knee 8/10 Goal status: INITIAL  PLAN:  PT FREQUENCY: 2x/week  PT DURATION: 8 weeks  PLANNED INTERVENTIONS: Therapeutic exercises, Therapeutic activity, Neuromuscular re-education, Balance training, Gait training, Patient/Family education, Self Care, Aquatic Therapy, Dry Needling, Electrical stimulation, Cryotherapy, Moist heat, Manual lymph drainage, Manual therapy, and Re-evaluation.  PLAN FOR NEXT SESSION : check goals. add to HEP.  Standing vs Seated core, Nustep.  Sit to stand , (functional testing)   Margarette Canada, PTA 10/22/2022, 2:33 PM

## 2022-10-31 ENCOUNTER — Ambulatory Visit: Payer: 59 | Attending: Neurosurgery | Admitting: Physical Therapy

## 2022-10-31 DIAGNOSIS — M5459 Other low back pain: Secondary | ICD-10-CM | POA: Diagnosis present

## 2022-10-31 DIAGNOSIS — M6281 Muscle weakness (generalized): Secondary | ICD-10-CM | POA: Diagnosis present

## 2022-10-31 DIAGNOSIS — M5415 Radiculopathy, thoracolumbar region: Secondary | ICD-10-CM | POA: Insufficient documentation

## 2022-10-31 NOTE — Therapy (Unsigned)
OUTPATIENT PHYSICAL THERAPY THORACOLUMBAR EVALUATION   Patient Name: Shannon [name] MRN: [id number] DOB:[birth year], 67 y.o., female Today's Date: 11/03/2022  END OF SESSION:  PT End of Session - 11/03/22 1048     Visit Number 10    Number of Visits 16    Date for PT Re-Evaluation 11/19/22    Authorization Type UHC , MCD    PT Start Time [id number]    PT Stop Time 1130    PT Time Calculation (min) 45 min    Activity Tolerance Patient tolerated treatment well    Behavior During Therapy WFL for tasks assessed/performed                Past Medical History:  Diagnosis Date   Arthritis    Back pain    Constipation    Depression    Fibromyalgia    Headache    Herniated disc    Hypertension    Joint pain    Knee pain    Lower extremity edema    Pre-diabetes    Prediabetes    Restless leg syndrome    Sarcoidosis 2000   Sciatica    Scoliosis    Snoring    SOB (shortness of breath)    Vitamin D deficiency    Past Surgical History:  Procedure Laterality Date   ABDOMINAL HYSTERECTOMY  1999   APPENDECTOMY  2019   BACK SURGERY     BREAST BIOPSY Right 12/2016   LAPAROSCOPIC APPENDECTOMY N/A 10/28/2017   Procedure: APPENDECTOMY LAPAROSCOPIC;  Surgeon: Leighton Ruff, MD;  Location: [facility];  Service: General;  Laterality: N/A;   TUBAL LIGATION     Patient Active Problem List   Diagnosis Date Noted   Lumbar adjacent segment disease with spondylolisthesis 02/26/2022   S/P lumbar spinal fusion 06/28/2021   Spondylolisthesis of lumbar region 06/28/2021   Constipation 02/21/2021   Diabetes mellitus ([place]) 11/26/2020   Chronic pain syndrome 11/26/2020   Appendicitis 10/28/2017   Cigarette smoker 07/26/2015   Dyspnea 05/29/2015   BACK PAIN, LUMBAR 05/29/2008   SARCOIDOSIS 12/21/2007   FATIGUE, CHRONIC 12/21/2007   RECTAL BLEEDING 12/14/2007   NECK PAIN, ACUTE 12/14/2007   ABDOMINAL PAIN, GENERALIZED 12/14/2007   DEPRESSION 05/13/2007   Hypertension associated with type  2 diabetes mellitus ([place]) 05/13/2007   PERIODONTAL DISEASE 05/13/2007   HEMATURIA 05/13/2007   ALLERGY 05/13/2007   Class 3 severe obesity with serious comorbidity and body mass index (BMI) of 40.0 to 44.9 in adult ([place]) 05/07/2007    PCP: Cletis Athens NP  REFERRING PROVIDER: Dr. Christella Noa   REFERRING DIAG: S/P Fusion, Spondylolisthesis  Rationale for Evaluation and Treatment: Rehabilitation  THERAPY DIAG:  Other low back pain  Muscle weakness (generalized)  Radiculopathy, thoracolumbar region  ONSET DATE: 02/26/2022  SUBJECTIVE:  SUBJECTIVE STATEMENT:  I am not having much back pain .  Its my knees and my ankle.     PERTINENT HISTORY:  Pre-diabetic, 06/28/2021: L4-L5 , L5-S1  Posterior lumbar interbody fusion  02/26/22:  L3-L4 PLIF See above   PAIN:  Are you having pain? Yes: NPRS scale: 2/10 Pain location: thoracolumbar spine and into posterior thighs  Pain description: vice, turning  Aggravating factors: standing  Relieving factors: sitting and laying down Ibuprofen, Tylenol, ice on back  PRECAUTIONS: None  WEIGHT BEARING RESTRICTIONS: No  FALLS:  Has patient fallen in last 6 months? No  LIVING ENVIRONMENT: Lives with: lives with their partner Lives in: House/apartment Stairs: No Has following equipment at home: None  OCCUPATION: Not working, was a patient transport Training and development officer)  PLOF: Independent  PATIENT GOALS: I want to feel better, get better and walk again. Lose wgt.   NEXT MD VISIT:   OBJECTIVE:   DIAGNOSTIC FINDINGS:  09/2022: CT   IMPRESSION:   1. Interval pedicle screw and interbody fusion at L3-4. Interval   development of bilateral pars defects at L3. No significant stenosis   2. Solid fusion L4-5 and L5-S1. Mild to moderate subarticular and   foraminal  stenosis bilaterally L5-S1 due to spurring.    PATIENT SURVEYS:  FOTO 36%  SCREENING FOR RED FLAGS: Bowel or bladder incontinence: No Spinal tumors: No Cauda equina syndrome: No Compression fracture: No Abdominal aneurysm: No  COGNITION: Overall cognitive status: Within functional limits for tasks assessed     SENSATION: Vague numbness in LEs, feet  MUSCLE LENGTH: NT    POSTURE: rounded shoulders, forward head, flexed trunk , and genu varus , knees flexed  PALPATION: L knee swollen> Rt.  Min tenderness in thoracolumbar spine Pain L L5-S1 and extending outward across superior glutes , Lt lateral hip and thigh   LUMBAR ROM:   AROM Eval 09/24/22 11/03/22  Flexion 80% limited pain Reaches just below knees, low back pain    Extension 80% limited pain  75% limited min pain   Right lateral flexion 50% pain R side   Better than to the L , still 25% or more limited   Left lateral flexion 50% pain L side  50% no pain   Right rotation 25% min pain No pain WNL   Left rotation 25% min pain  No pain  WNL    (Blank rows = not tested)  LOWER EXTREMITY ROM:     Active  Right eval Left eval  Hip flexion    Hip extension    Hip abduction    Hip adduction    Hip internal rotation    Hip external rotation    Knee flexion 115 120+  Knee extension -10 -10  Ankle dorsiflexion    Ankle plantarflexion    Ankle inversion    Ankle eversion     (Blank rows = not tested)  LOWER EXTREMITY MMT:    MMT Right eval Left eval  Hip flexion 3-/5 3-/5  Hip extension NT NT   Hip abduction 3/5 3/5  Hip adduction    Hip internal rotation    Hip external rotation    Knee flexion 5/5 5/5  Knee extension 5/5 5/5  Ankle dorsiflexion 5/5 5/5  Ankle plantarflexion    Ankle inversion    Ankle eversion     (Blank rows = not tested)  LUMBAR SPECIAL TESTS:  Straight leg raise test: Positive Lt.   FUNCTIONAL TESTS:  5 times sit to stand: unable  to complete > 2 reps due to pain in L  back and bilateral knees, 2 reps about 30 sec with hands  09/29/22: 2 min walk test: 398 no rest break , o2 sats 97% and HR 125, knee pain limited her- 8/10 pain  GAIT: Distance walked: 75 Assistive device utilized: None Level of assistance: Modified independence Comments: slow pace, antalgic gait    TODAY'S TREATMENT:      [facility] Adult PT Treatment:                                                DATE: 11/03/22 Therapeutic Exercise: Nustep 6 min L 5 UE and LE  Treadmill up to 1.5 mph and 2 % grade for 6 min  Leg press 1 plate double leg x 20 2 sets, added slight turnout  Knee extension 20 lbs x 2  Knee flexion 25 lbs x 15  Row 25 lbs x 10  Lat pull down x 25  x 10  Trunk motion standing and then seated rotation and sidebending  Self Care: Principles of progressive overload and gym exercises  Recommended machines for beginner and improved safety.   POC    [facility] Adult PT Treatment:                                                DATE: 10/31/22 Therapeutic Exercise: Nustep L5 UE and LE for 6 min  Calf stretch for lower leg 2 x 30 sec on wall Bilateral shoulder extension with ball squeeze x 10 blue band  Lower trunk rotation Physio ball hamstring curls Physio ball bridge  Core press with ball opposite arm on opp thigh x 5  Added SLR Rt LE painful Standing hip abduction /ext. X 10  Hip flexor stretch 30 sec x 2  FOTO done   [facility] Adult PT Treatment:                                                DATE: 10/22/22 Therapeutic Exercise: NuStep L5 UE and LE x 7 min  Physioball: LTR x 10, hamstring curls  x 15  Bridge x10 Sit to stand no weight today x10 - pain in knees and back Supine PPT with alternating BKFO BlueTB x10 - cues for pacing and sequencing    [facility] Adult PT Treatment:                                                DATE: 10/20/22 Therapeutic Exercise: NuStep L5 UE and LE , 7 min  Physioball: LTR x 10, hamstring curls  x 15 and bridges Hamstring stretch 3 x 20 sec   Piriformis stretch with towel to assist  Sit to stand 10 lbs x 8, then x 10 without wgt per pt request Hip hinge with blue band on feet  x 15 , min cues    [facility] Adult PT Treatment:  DATE: 10/14/22 Therapeutic Exercise: NuStep L4 UE and LE for 6 min   Spine mobility in multiple positions for NM re-education  Seated on physio ball hands on table PPT Alt. UE lift then double arm  Upper arm rotation x 5 Standing core press with physioball on high mat x 10  Heel raise x 10 holding ball  Hip extension high mat  x 15  Therapeutic Activity: Bed mobility supine to sit to supine    PATIENT EDUCATION:  Education details: PT, POC, HEP, core/breathing, pool , sit to stand  Person educated: Patient Education method: Consulting civil engineer, Demonstration, Verbal cues, and Handouts Education comprehension: verbalized understanding and needs further education  HOME EXERCISE PROGRAM: Access Code: [email or web address] URL: [email or web address] Date: 09/24/2022 Prepared by: Raeford Razor  ASSESSMENT:  CLINICAL IMPRESSION: Patient reports her long-term goal [facility].  Introduced her to weight machines today to get her comfortable with the process and see how her knees will respond. Overall she had no increased back ain but her knees were an issue with getting up from a low surface.  Will continue to reinforce the need for strengthening to improve her function. Cont POC 3/20  OBJECTIVE IMPAIRMENTS: cardiopulmonary status limiting activity, decreased activity tolerance, decreased endurance, decreased knowledge of use of DME, decreased mobility, difficulty walking, decreased ROM, decreased strength, increased edema, increased fascial restrictions, impaired flexibility, impaired sensation, improper body mechanics, postural dysfunction, obesity, and pain.   ACTIVITY LIMITATIONS: carrying, lifting, bending, sitting, standing, squatting,  sleeping, stairs, transfers, bed mobility, locomotion level, and caring for others  PARTICIPATION LIMITATIONS: meal prep, cleaning, laundry, interpersonal relationship, driving, shopping, and community activity  PERSONAL FACTORS: Time since onset of injury/illness/exacerbation and 3+ comorbidities: previous back surgery, obesity, diabetes   are also affecting patient's functional outcome.   REHAB POTENTIAL: Good  CLINICAL DECISION MAKING: Evolving/moderate complexity  EVALUATION COMPLEXITY: Moderate   GOALS: Goals reviewed with patient? Yes  SHORT TERM GOALS: Target date: 10/22/2022    Pt will be I with HEP for core, posture and LE strength/mobility  Baseline: Goal status: MET   2.  Pt will be able to complete functional assessment and set goal (sit to stand, gait)  Baseline: unable to complete due to knee pain  Goal status: MET   3.  Pt will be able to perform bed mobility and chair/mat transfers with no increase in pain  Baseline:  Goal status: MET   4.  Pt will be able to stand for 10 min for light housework with pain <6/10 Baseline: 8/10+ Goal status: Ongoing   LONG TERM GOALS: Target date: 11/19/2022    Pt will be able to improve FOTO score to 47% or greater to demo improved functional mobility.  Baseline: 36% Goal status: INITIAL  2.  Pt will be able to improve hip strength to 4+/5 or better for improved back support with transfers and gait  Baseline:  Goal status: INITIAL  3.  Pt will be able to walk in the grocery store for 30 min with min increase in pain from baseline, using cart for support  Baseline: mod to severe  Goal status: INITIAL  4.  Pt will be able to perform proper hip hinge to improve safety with light home tasks (vs bending) and improved spine stability  Baseline:  Goal status: INITIAL  5.  Pt will be I with HEP upon discharge  Baseline:  Goal status: INITIAL  6.  Pt will be able to walk 450 feet in  2 min walk test with knee pain no more  than 6/10, no increase in back pain .  Baseline: 398, knee 8/10 Goal status: INITIAL  PLAN:  PT FREQUENCY: 2x/week  PT DURATION: 8 weeks  PLANNED INTERVENTIONS: Therapeutic exercises, Therapeutic activity, Neuromuscular re-education, Balance training, Gait training, Patient/Family education, Self Care, Aquatic Therapy, Dry Needling, Electrical stimulation, Cryotherapy, Moist heat, Manual lymph drainage, Manual therapy, and Re-evaluation.  PLAN FOR NEXT SESSION : check goals. add to HEP.  Standing vs Seated core, Nustep.  Sit to stand , (functional testing)   Jostin Rue, PT 11/03/2022, 11:31 AM  Raeford Razor, PT 11/03/22 11:31 AM Phone: [phone] Fax: [phone]

## 2022-10-31 NOTE — Therapy (Signed)
OUTPATIENT PHYSICAL THERAPY THORACOLUMBAR EVALUATION   Patient Name: Shannon [name] MRN: [id number] DOB:10/19/1955, 67 y.o., female Today's Date: 10/31/2022  END OF SESSION:  PT End of Session - 10/31/22 1107     Visit Number 9    Number of Visits 16    Date for PT Re-Evaluation 11/19/22    Authorization Type UHC , MCD    PT Start Time 1105    PT Stop Time 1145    PT Time Calculation (min) 40 min    Activity Tolerance Patient tolerated treatment well    Behavior During Therapy WFL for tasks assessed/performed               Past Medical History:  Diagnosis Date   Arthritis    Back pain    Constipation    Depression    Fibromyalgia    Headache    Herniated disc    Hypertension    Joint pain    Knee pain    Lower extremity edema    Pre-diabetes    Prediabetes    Restless leg syndrome    Sarcoidosis 2000   Sciatica    Scoliosis    Snoring    SOB (shortness of breath)    Vitamin D deficiency    Past Surgical History:  Procedure Laterality Date   ABDOMINAL HYSTERECTOMY  1999   APPENDECTOMY  2019   BACK SURGERY     BREAST BIOPSY Right 12/2016   LAPAROSCOPIC APPENDECTOMY N/A 10/28/2017   Procedure: APPENDECTOMY LAPAROSCOPIC;  Surgeon: Leighton Ruff, MD;  Location: [facility];  Service: General;  Laterality: N/A;   TUBAL LIGATION     Patient Active Problem List   Diagnosis Date Noted   Lumbar adjacent segment disease with spondylolisthesis 02/26/2022   S/P lumbar spinal fusion 06/28/2021   Spondylolisthesis of lumbar region 06/28/2021   Constipation 02/21/2021   Diabetes mellitus ([place]) 11/26/2020   Chronic pain syndrome 11/26/2020   Appendicitis 10/28/2017   Cigarette smoker 07/26/2015   Dyspnea 05/29/2015   BACK PAIN, LUMBAR 05/29/2008   SARCOIDOSIS 12/21/2007   FATIGUE, CHRONIC 12/21/2007   RECTAL BLEEDING 12/14/2007   NECK PAIN, ACUTE 12/14/2007   ABDOMINAL PAIN, GENERALIZED 12/14/2007   DEPRESSION 05/13/2007   Hypertension associated with type 2  diabetes mellitus ([place]) 05/13/2007   PERIODONTAL DISEASE 05/13/2007   HEMATURIA 05/13/2007   ALLERGY 05/13/2007   Class 3 severe obesity with serious comorbidity and body mass index (BMI) of 40.0 to 44.9 in adult ([place]) 05/07/2007    PCP: Cletis Athens NP  REFERRING PROVIDER: Dr. Christella Noa   REFERRING DIAG: S/P Fusion, Spondylolisthesis  Rationale for Evaluation and Treatment: Rehabilitation  THERAPY DIAG:  Other low back pain  Muscle weakness (generalized)  Radiculopathy, thoracolumbar region  ONSET DATE: 02/26/2022  SUBJECTIVE:  SUBJECTIVE STATEMENT: I was walking a lot the other day.  Audry Pili was home and we went everywhere. My calves were so sore.   PERTINENT HISTORY:  Pre-diabetic, 06/28/2021: L4-L5 , L5-S1  Posterior lumbar interbody fusion  02/26/22:  L3-L4 PLIF See above   PAIN:  Are you having pain? Yes: NPRS scale: 7/10 Pain location: thoracolumbar spine and into posterior thighs  Pain description: vice, turning  Aggravating factors: standing  Relieving factors: sitting and laying down Ibuprofen, Tylenol, ice on back  PRECAUTIONS: None  WEIGHT BEARING RESTRICTIONS: No  FALLS:  Has patient fallen in last 6 months? No  LIVING ENVIRONMENT: Lives with: lives with their partner Lives in: House/apartment Stairs: No Has following equipment at home: None  OCCUPATION: Not working, was a patient transport Training and development officer)  PLOF: Independent  PATIENT GOALS: I want to feel better, get better and walk again. Lose wgt.   NEXT MD VISIT:   OBJECTIVE:   DIAGNOSTIC FINDINGS:  09/2022: CT   IMPRESSION:   1. Interval pedicle screw and interbody fusion at L3-4. Interval   development of bilateral pars defects at L3. No significant stenosis   2. Solid fusion L4-5 and L5-S1. Mild to moderate  subarticular and   foraminal stenosis bilaterally L5-S1 due to spurring.    PATIENT SURVEYS:  FOTO 36%  SCREENING FOR RED FLAGS: Bowel or bladder incontinence: No Spinal tumors: No Cauda equina syndrome: No Compression fracture: No Abdominal aneurysm: No  COGNITION: Overall cognitive status: Within functional limits for tasks assessed     SENSATION: Vague numbness in LEs, feet  MUSCLE LENGTH: NT    POSTURE: rounded shoulders, forward head, flexed trunk , and genu varus , knees flexed  PALPATION: L knee swollen> Rt.  Min tenderness in thoracolumbar spine Pain L L5-S1 and extending outward across superior glutes , Lt lateral hip and thigh   LUMBAR ROM:   AROM Eval 09/24/22  Flexion 80% limited pain  Extension 80% limited pain   Right lateral flexion 50% pain R side    Left lateral flexion 50% pain L side   Right rotation 25% min pain  Left rotation 25% min pain    (Blank rows = not tested)  LOWER EXTREMITY ROM:     Active  Right eval Left eval  Hip flexion    Hip extension    Hip abduction    Hip adduction    Hip internal rotation    Hip external rotation    Knee flexion 115 120+  Knee extension -10 -10  Ankle dorsiflexion    Ankle plantarflexion    Ankle inversion    Ankle eversion     (Blank rows = not tested)  LOWER EXTREMITY MMT:    MMT Right eval Left eval  Hip flexion 3-/5 3-/5  Hip extension NT NT   Hip abduction 3/5 3/5  Hip adduction    Hip internal rotation    Hip external rotation    Knee flexion 5/5 5/5  Knee extension 5/5 5/5  Ankle dorsiflexion 5/5 5/5  Ankle plantarflexion    Ankle inversion    Ankle eversion     (Blank rows = not tested)  LUMBAR SPECIAL TESTS:  Straight leg raise test: Positive Lt.   FUNCTIONAL TESTS:  5 times sit to stand: unable to complete > 2 reps due to pain in L back and bilateral knees, 2 reps about 30 sec with hands  09/29/22: 2 min walk test: 398 no rest break , o2 sats 97% and HR  125, knee  pain limited her- 8/10 pain  GAIT: Distance walked: 75 Assistive device utilized: None Level of assistance: Modified independence Comments: slow pace, antalgic gait    TODAY'S TREATMENT:      [facility] Adult PT Treatment:                                                DATE: 10/31/22 Therapeutic Exercise: Nustep L5 UE and LE for 6 min  Calf stretch for lower leg 2 x 30 sec on wall Bilateral shoulder extension with ball squeeze x 10 blue band  Lower trunk rotation Physio ball hamstring curls Physio ball bridge  Core press with ball opposite arm on opp thigh x 5  Added SLR Rt LE painful Standing hip abduction /ext. X 10  Hip flexor stretch 30 sec x 2  FOTO done   [facility] Adult PT Treatment:                                                DATE: 10/22/22 Therapeutic Exercise: NuStep L5 UE and LE x 7 min  Physioball: LTR x 10, hamstring curls  x 15  Bridge x10 Sit to stand no weight today x10 - pain in knees and back Supine PPT with alternating BKFO BlueTB x10 - cues for pacing and sequencing    [facility] Adult PT Treatment:                                                DATE: 10/20/22 Therapeutic Exercise: NuStep L5 UE and LE , 7 min  Physioball: LTR x 10, hamstring curls  x 15 and bridges Hamstring stretch 3 x 20 sec  Piriformis stretch with towel to assist  Sit to stand 10 lbs x 8, then x 10 without wgt per pt request Hip hinge with blue band on feet  x 15 , min cues    [facility] Adult PT Treatment:                                                DATE: 10/14/22 Therapeutic Exercise: NuStep L4 UE and LE for 6 min   Spine mobility in multiple positions for NM re-education  Seated on physio ball hands on table PPT Alt. UE lift then double arm  Upper arm rotation x 5 Standing core press with physioball on high mat x 10  Heel raise x 10 holding ball  Hip extension high mat  x 15  Therapeutic Activity: Bed mobility supine to sit to supine    PATIENT EDUCATION:  Education details: PT, POC, HEP,  core/breathing, pool , sit to stand  Person educated: Patient Education method: Consulting civil engineer, Demonstration, Verbal cues, and Handouts Education comprehension: verbalized understanding and needs further education  HOME EXERCISE PROGRAM: Access Code: [email or web address] URL: [email or web address] Date: 09/24/2022 Prepared by: Raeford Razor  ASSESSMENT:  CLINICAL IMPRESSION: Patient with increased pain today , soreness in general.  She has improved her FOTO score to 57%  despite her pain .  Fibromyalgia soreness plays a role in her overall symptoms as well.     OBJECTIVE IMPAIRMENTS: cardiopulmonary status limiting activity, decreased activity tolerance, decreased endurance, decreased knowledge of use of DME, decreased mobility, difficulty walking, decreased ROM, decreased strength, increased edema, increased fascial restrictions, impaired flexibility, impaired sensation, improper body mechanics, postural dysfunction, obesity, and pain.   ACTIVITY LIMITATIONS: carrying, lifting, bending, sitting, standing, squatting, sleeping, stairs, transfers, bed mobility, locomotion level, and caring for others  PARTICIPATION LIMITATIONS: meal prep, cleaning, laundry, interpersonal relationship, driving, shopping, and community activity  PERSONAL FACTORS: Time since onset of injury/illness/exacerbation and 3+ comorbidities: previous back surgery, obesity, diabetes   are also affecting patient's functional outcome.   REHAB POTENTIAL: Good  CLINICAL DECISION MAKING: Evolving/moderate complexity  EVALUATION COMPLEXITY: Moderate   GOALS: Goals reviewed with patient? Yes  SHORT TERM GOALS: Target date: 10/22/2022    Pt will be I with HEP for core, posture and LE strength/mobility  Baseline: Goal status: MET   2.  Pt will be able to complete functional assessment and set goal (sit to stand, gait)  Baseline: unable to complete due to knee pain  Goal status: MET   3.  Pt will be able to perform  bed mobility and chair/mat transfers with no increase in pain  Baseline:  Goal status: MET   4.  Pt will be able to stand for 10 min for light housework with pain <6/10 Baseline: 8/10+ Goal status: Ongoing   LONG TERM GOALS: Target date: 11/19/2022    Pt will be able to improve FOTO score to 47% or greater to demo improved functional mobility.  Baseline: 36% Goal status: INITIAL  2.  Pt will be able to improve hip strength to 4+/5 or better for improved back support with transfers and gait  Baseline:  Goal status: INITIAL  3.  Pt will be able to walk in the grocery store for 30 min with min increase in pain from baseline, using cart for support  Baseline: mod to severe  Goal status: INITIAL  4.  Pt will be able to perform proper hip hinge to improve safety with light home tasks (vs bending) and improved spine stability  Baseline:  Goal status: INITIAL  5.  Pt will be I with HEP upon discharge  Baseline:  Goal status: INITIAL  6.  Pt will be able to walk 450 feet in 2 min walk test with knee pain no more than 6/10, no increase in back pain .  Baseline: 398, knee 8/10 Goal status: INITIAL  PLAN:  PT FREQUENCY: 2x/week  PT DURATION: 8 weeks  PLANNED INTERVENTIONS: Therapeutic exercises, Therapeutic activity, Neuromuscular re-education, Balance training, Gait training, Patient/Family education, Self Care, Aquatic Therapy, Dry Needling, Electrical stimulation, Cryotherapy, Moist heat, Manual lymph drainage, Manual therapy, and Re-evaluation.  PLAN FOR NEXT SESSION : check goals. add to HEP.  Standing vs Seated core, Nustep.  Sit to stand , (functional testing)   Quamaine Webb, PT 10/31/2022, 12:50 PM  Raeford Razor, PT 10/31/22 12:51 PM Phone: [phone] Fax: [phone]

## 2022-11-03 ENCOUNTER — Encounter: Payer: Self-pay | Admitting: Physical Therapy

## 2022-11-03 ENCOUNTER — Ambulatory Visit: Payer: 59 | Admitting: Physical Therapy

## 2022-11-03 DIAGNOSIS — M5459 Other low back pain: Secondary | ICD-10-CM

## 2022-11-03 DIAGNOSIS — M5415 Radiculopathy, thoracolumbar region: Secondary | ICD-10-CM

## 2022-11-03 DIAGNOSIS — M6281 Muscle weakness (generalized): Secondary | ICD-10-CM

## 2022-11-05 ENCOUNTER — Encounter: Payer: Self-pay | Admitting: Physical Therapy

## 2022-11-05 ENCOUNTER — Ambulatory Visit: Payer: 59 | Admitting: Physical Therapy

## 2022-11-05 DIAGNOSIS — M5415 Radiculopathy, thoracolumbar region: Secondary | ICD-10-CM

## 2022-11-05 DIAGNOSIS — M5459 Other low back pain: Secondary | ICD-10-CM

## 2022-11-05 DIAGNOSIS — M6281 Muscle weakness (generalized): Secondary | ICD-10-CM

## 2022-11-05 NOTE — Therapy (Signed)
OUTPATIENT PHYSICAL THERAPY THORACOLUMBAR EVALUATION   Patient Name: Shannon [name] MRN: [id number] DOB:07/13/1956, 67 y.o., female Today's Date: 11/05/2022  END OF SESSION:  PT End of Session - 11/05/22 1104     Visit Number 11    Number of Visits 16    Date for PT Re-Evaluation 11/19/22    Authorization Type [employer] , MCD    PT Start Time 1100    PT Stop Time 1145    PT Time Calculation (min) 45 min    Activity Tolerance Patient tolerated treatment well    Behavior During Therapy WFL for tasks assessed/performed                 Past Medical History:  Diagnosis Date   Arthritis    Back pain    Constipation    Depression    Fibromyalgia    Headache    Herniated disc    Hypertension    Joint pain    Knee pain    Lower extremity edema    Pre-diabetes    Prediabetes    Restless leg syndrome    Sarcoidosis 2000   Sciatica    Scoliosis    Snoring    SOB (shortness of breath)    Vitamin D deficiency    Past Surgical History:  Procedure Laterality Date   ABDOMINAL HYSTERECTOMY  1999   APPENDECTOMY  2019   BACK SURGERY     BREAST BIOPSY Right 12/2016   LAPAROSCOPIC APPENDECTOMY N/A 10/28/2017   Procedure: APPENDECTOMY LAPAROSCOPIC;  Surgeon: Leighton Ruff, MD;  Location: [facility];  Service: General;  Laterality: N/A;   TUBAL LIGATION     Patient Active Problem List   Diagnosis Date Noted   Lumbar adjacent segment disease with spondylolisthesis 02/26/2022   S/P lumbar spinal fusion 06/28/2021   Spondylolisthesis of lumbar region 06/28/2021   Constipation 02/21/2021   Diabetes mellitus ([place]) 11/26/2020   Chronic pain syndrome 11/26/2020   Appendicitis 10/28/2017   Cigarette smoker 07/26/2015   Dyspnea 05/29/2015   BACK PAIN, LUMBAR 05/29/2008   SARCOIDOSIS 12/21/2007   FATIGUE, CHRONIC 12/21/2007   RECTAL BLEEDING 12/14/2007   NECK PAIN, ACUTE 12/14/2007   ABDOMINAL PAIN, GENERALIZED 12/14/2007   DEPRESSION 05/13/2007   Hypertension associated with  type 2 diabetes mellitus ([place]) 05/13/2007   PERIODONTAL DISEASE 05/13/2007   HEMATURIA 05/13/2007   ALLERGY 05/13/2007   Class 3 severe obesity with serious comorbidity and body mass index (BMI) of 40.0 to 44.9 in adult ([place]) 05/07/2007    PCP: Cletis Athens NP  REFERRING PROVIDER: Dr. Christella Noa   REFERRING DIAG: S/P Fusion, Spondylolisthesis  Rationale for Evaluation and Treatment: Rehabilitation  THERAPY DIAG:  Other low back pain  Muscle weakness (generalized)  Radiculopathy, thoracolumbar region  ONSET DATE: 02/26/2022  SUBJECTIVE:  SUBJECTIVE STATEMENT:  My back is good.  My knees were pretty sore after last time.    PERTINENT HISTORY:  Pre-diabetic, 06/28/2021: L4-L5 , L5-S1  Posterior lumbar interbody fusion  02/26/22:  L3-L4 PLIF See above   PAIN:  Are you having pain? Yes: NPRS scale: 2/10 Pain location: thoracolumbar spine and into posterior thighs  Pain description: vice, turning  Aggravating factors: standing  Relieving factors: sitting and laying down Ibuprofen, Tylenol, ice on back  PRECAUTIONS: None  WEIGHT BEARING RESTRICTIONS: No  FALLS:  Has patient fallen in last 6 months? No  LIVING ENVIRONMENT: Lives with: lives with their partner Lives in: House/apartment Stairs: No Has following equipment at home: None  OCCUPATION: Not working, was a patient transport Training and development officer)  PLOF: Independent  PATIENT GOALS: I want to feel better, get better and walk again. Lose wgt.   NEXT MD VISIT:   OBJECTIVE:   DIAGNOSTIC FINDINGS:  09/2022: CT   IMPRESSION:   1. Interval pedicle screw and interbody fusion at L3-4. Interval   development of bilateral pars defects at L3. No significant stenosis   2. Solid fusion L4-5 and L5-S1. Mild to moderate subarticular and    foraminal stenosis bilaterally L5-S1 due to spurring.    PATIENT SURVEYS:  FOTO 36%  SCREENING FOR RED FLAGS: Bowel or bladder incontinence: No Spinal tumors: No Cauda equina syndrome: No Compression fracture: No Abdominal aneurysm: No  COGNITION: Overall cognitive status: Within functional limits for tasks assessed     SENSATION: Vague numbness in LEs, feet  MUSCLE LENGTH: NT    POSTURE: rounded shoulders, forward head, flexed trunk , and genu varus , knees flexed  PALPATION: L knee swollen> Rt.  Min tenderness in thoracolumbar spine Pain L L5-S1 and extending outward across superior glutes , Lt lateral hip and thigh   LUMBAR ROM:   AROM Eval 09/24/22 11/03/22  Flexion 80% limited pain Reaches just below knees, low back pain    Extension 80% limited pain  75% limited min pain   Right lateral flexion 50% pain R side   Better than to the L , still 25% or more limited   Left lateral flexion 50% pain L side  50% no pain   Right rotation 25% min pain No pain WNL   Left rotation 25% min pain  No pain  WNL    (Blank rows = not tested)  LOWER EXTREMITY ROM:     Active  Right eval Left eval  Hip flexion    Hip extension    Hip abduction    Hip adduction    Hip internal rotation    Hip external rotation    Knee flexion 115 120+  Knee extension -10 -10  Ankle dorsiflexion    Ankle plantarflexion    Ankle inversion    Ankle eversion     (Blank rows = not tested)  LOWER EXTREMITY MMT:    MMT Right eval Left eval  Hip flexion 3-/5 3-/5  Hip extension NT NT   Hip abduction 3/5 3/5  Hip adduction    Hip internal rotation    Hip external rotation    Knee flexion 5/5 5/5  Knee extension 5/5 5/5  Ankle dorsiflexion 5/5 5/5  Ankle plantarflexion    Ankle inversion    Ankle eversion     (Blank rows = not tested)  LUMBAR SPECIAL TESTS:  Straight leg raise test: Positive Lt.   FUNCTIONAL TESTS:  5 times sit to stand: unable to complete >  2 reps due to  pain in L back and bilateral knees, 2 reps about 30 sec with hands  09/29/22: 2 min walk test: 398 no rest break , o2 sats 97% and HR 125, knee pain limited her- 8/10 pain  GAIT: Distance walked: 75 Assistive device utilized: None Level of assistance: Modified independence Comments: slow pace, antalgic gait    TODAY'S TREATMENT:      [facility] Adult PT Treatment:                                                DATE: 11/05/22 Therapeutic Exercise: Treadmill 1.5 mph for 8 min  Supine core :breathing/Pilates ring squeeze x 10  Supine march with ring x 10  Lower trunk rotation x 10 reset  Bridge in parallel x 15 , 3 sec hold  SLR  x 10  Sidelying x 10  Tandem on Airex for core, head turns  Narrow stance with single arm lateral and forward raise, 5 lbs    [facility] Adult PT Treatment:                                                DATE: 11/03/22 Therapeutic Exercise: Nustep 6 min L 5 UE and LE  Treadmill up to 1.5 mph and 2 % grade for 6 min  Leg press 1 plate double leg x 20 2 sets, added slight turnout  Knee extension 20 lbs x 2  Knee flexion 25 lbs x 15  Row 25 lbs x 10  Lat pull down x 25  x 10  Trunk motion standing and then seated rotation and sidebending  Self Care: Principles of progressive overload and gym exercises  Recommended machines for beginner and improved safety.   POC    [facility] Adult PT Treatment:                                                DATE: 10/31/22 Therapeutic Exercise: Nustep L5 UE and LE for 6 min  Calf stretch for lower leg 2 x 30 sec on wall Bilateral shoulder extension with ball squeeze x 10 blue band  Lower trunk rotation Physio ball hamstring curls Physio ball bridge  Core press with ball opposite arm on opp thigh x 5  Added SLR Rt LE painful Standing hip abduction /ext. X 10  Hip flexor stretch 30 sec x 2  FOTO done   [facility] Adult PT Treatment:                                                DATE: 10/22/22 Therapeutic Exercise: NuStep L5 UE and LE x 7  min  Physioball: LTR x 10, hamstring curls  x 15  Bridge x10 Sit to stand no weight today x10 - pain in knees and back Supine PPT with alternating BKFO BlueTB x10 - cues for pacing and sequencing    [facility] Adult PT Treatment:  DATE: 10/20/22 Therapeutic Exercise: NuStep L5 UE and LE , 7 min  Physioball: LTR x 10, hamstring curls  x 15 and bridges Hamstring stretch 3 x 20 sec  Piriformis stretch with towel to assist  Sit to stand 10 lbs x 8, then x 10 without wgt per pt request Hip hinge with blue band on feet  x 15 , min cues    [facility] Adult PT Treatment:                                                DATE: 10/14/22 Therapeutic Exercise: NuStep L4 UE and LE for 6 min   Spine mobility in multiple positions for NM re-education  Seated on physio ball hands on table PPT Alt. UE lift then double arm  Upper arm rotation x 5 Standing core press with physioball on high mat x 10  Heel raise x 10 holding ball  Hip extension high mat  x 15  Therapeutic Activity: Bed mobility supine to sit to supine    PATIENT EDUCATION:  Education details: PT, POC, HEP, core/breathing, pool , sit to stand  Person educated: Patient Education method: Consulting civil engineer, Demonstration, Verbal cues, and Handouts Education comprehension: verbalized understanding and needs further education  HOME EXERCISE PROGRAM: Access Code: [email or web address] URL: [email or web address] Date: 09/24/2022 Prepared by: Raeford Razor  ASSESSMENT:  CLINICAL IMPRESSION: Patient able to report no increased back pain following a gym-type workout Monday.  She has positive feedback with flexion based core exercises.  She declined squat and sit to stand because she didn't want to further aggravate her knees. Following up with Dr. Christella Noa next week.  Cont POC.   OBJECTIVE IMPAIRMENTS: cardiopulmonary status limiting activity, decreased activity tolerance, decreased endurance, decreased  knowledge of use of DME, decreased mobility, difficulty walking, decreased ROM, decreased strength, increased edema, increased fascial restrictions, impaired flexibility, impaired sensation, improper body mechanics, postural dysfunction, obesity, and pain.   ACTIVITY LIMITATIONS: carrying, lifting, bending, sitting, standing, squatting, sleeping, stairs, transfers, bed mobility, locomotion level, and caring for others  PARTICIPATION LIMITATIONS: meal prep, cleaning, laundry, interpersonal relationship, driving, shopping, and community activity  PERSONAL FACTORS: Time since onset of injury/illness/exacerbation and 3+ comorbidities: previous back surgery, obesity, diabetes   are also affecting patient's functional outcome.   REHAB POTENTIAL: Good  CLINICAL DECISION MAKING: Evolving/moderate complexity  EVALUATION COMPLEXITY: Moderate   GOALS: Goals reviewed with patient? Yes  SHORT TERM GOALS: Target date: 10/22/2022    Pt will be I with HEP for core, posture and LE strength/mobility  Baseline: Goal status: MET   2.  Pt will be able to complete functional assessment and set goal (sit to stand, gait)  Baseline: unable to complete due to knee pain  Goal status: MET   3.  Pt will be able to perform bed mobility and chair/mat transfers with no increase in pain  Baseline:  Goal status: MET   4.  Pt will be able to stand for 10 min for light housework with pain <6/10 Baseline: 8/10+ Goal status: Ongoing   LONG TERM GOALS: Target date: 11/19/2022    Pt will be able to improve FOTO score to 47% or greater to demo improved functional mobility.  Baseline: 36% Goal status: INITIAL  2.  Pt will be able to improve hip strength to 4+/5 or better for improved back support with transfers and gait  Baseline:  Goal status: INITIAL  3.  Pt will be able to walk in the grocery store for 30 min with min increase in pain from baseline, using cart for support  Baseline: mod to severe  Goal  status: INITIAL  4.  Pt will be able to perform proper hip hinge to improve safety with light home tasks (vs bending) and improved spine stability  Baseline:  Goal status: INITIAL  5.  Pt will be I with HEP upon discharge  Baseline:  Goal status: INITIAL  6.  Pt will be able to walk 450 feet in 2 min walk test with knee pain no more than 6/10, no increase in back pain .  Baseline: 398, knee 8/10 Goal status: INITIAL  PLAN:  PT FREQUENCY: 2x/week  PT DURATION: 8 weeks  PLANNED INTERVENTIONS: Therapeutic exercises, Therapeutic activity, Neuromuscular re-education, Balance training, Gait training, Patient/Family education, Self Care, Aquatic Therapy, Dry Needling, Electrical stimulation, Cryotherapy, Moist heat, Manual lymph drainage, Manual therapy, and Re-evaluation.  PLAN FOR NEXT SESSION : check goals. add to HEP.  Standing vs Seated core, Nustep.  Sit to stand , (functional testing)   Tanetta Fuhriman, PT 11/05/2022, 11:05 AM  Raeford Razor, PT 11/05/22 11:05 AM Phone: [phone] Fax: [phone]

## 2022-11-07 NOTE — Therapy (Signed)
OUTPATIENT PHYSICAL THERAPY THORACOLUMBAR TREATMENT   Patient Name: Shannon [name] MRN: [id number] DOB:[birth year], 67 y.o., female Today's Date: 11/07/2022  END OF SESSION:        Past Medical History:  Diagnosis Date   Arthritis    Back pain    Constipation    Depression    Fibromyalgia    Headache    Herniated disc    Hypertension    Joint pain    Knee pain    Lower extremity edema    Pre-diabetes    Prediabetes    Restless leg syndrome    Sarcoidosis 2000   Sciatica    Scoliosis    Snoring    SOB (shortness of breath)    Vitamin D deficiency    Past Surgical History:  Procedure Laterality Date   ABDOMINAL HYSTERECTOMY  1999   APPENDECTOMY  2019   BACK SURGERY     BREAST BIOPSY Right 12/2016   LAPAROSCOPIC APPENDECTOMY N/A 10/28/2017   Procedure: APPENDECTOMY LAPAROSCOPIC;  Surgeon: Leighton Ruff, MD;  Location: [facility];  Service: General;  Laterality: N/A;   TUBAL LIGATION     Patient Active Problem List   Diagnosis Date Noted   Lumbar adjacent segment disease with spondylolisthesis 02/26/2022   S/P lumbar spinal fusion 06/28/2021   Spondylolisthesis of lumbar region 06/28/2021   Constipation 02/21/2021   Diabetes mellitus ([place]) 11/26/2020   Chronic pain syndrome 11/26/2020   Appendicitis 10/28/2017   Cigarette smoker 07/26/2015   Dyspnea 05/29/2015   BACK PAIN, LUMBAR 05/29/2008   SARCOIDOSIS 12/21/2007   FATIGUE, CHRONIC 12/21/2007   RECTAL BLEEDING 12/14/2007   NECK PAIN, ACUTE 12/14/2007   ABDOMINAL PAIN, GENERALIZED 12/14/2007   DEPRESSION 05/13/2007   Hypertension associated with type 2 diabetes mellitus ([place]) 05/13/2007   PERIODONTAL DISEASE 05/13/2007   HEMATURIA 05/13/2007   ALLERGY 05/13/2007   Class 3 severe obesity with serious comorbidity and body mass index (BMI) of 40.0 to 44.9 in adult ([place]) 05/07/2007    PCP: Cletis Athens NP  REFERRING PROVIDER: Dr. Christella Noa   REFERRING DIAG: S/P Fusion, Spondylolisthesis  Rationale  for Evaluation and Treatment: Rehabilitation  THERAPY DIAG:  No diagnosis found.  ONSET DATE: 02/26/2022  SUBJECTIVE:                                                                                                                                                                                           SUBJECTIVE STATEMENT:  My back is good.  My knees were pretty sore after last time.    PERTINENT HISTORY:  Pre-diabetic, 06/28/2021: L4-L5 , L5-S1  Posterior lumbar interbody fusion  02/26/22:  L3-L4 PLIF See above   PAIN:  Are you having pain? Yes: NPRS scale: 2/10 Pain location: thoracolumbar spine and into posterior thighs  Pain description: vice, turning  Aggravating factors: standing  Relieving factors: sitting and laying down Ibuprofen, Tylenol, ice on back  PRECAUTIONS: None  WEIGHT BEARING RESTRICTIONS: No  FALLS:  Has patient fallen in last 6 months? No  LIVING ENVIRONMENT: Lives with: lives with their partner Lives in: House/apartment Stairs: No Has following equipment at home: None  OCCUPATION: Not working, was a patient transport Training and development officer)  PLOF: Independent  PATIENT GOALS: I want to feel better, get better and walk again. Lose wgt.   NEXT MD VISIT:   OBJECTIVE:   DIAGNOSTIC FINDINGS:  09/2022: CT   IMPRESSION:   1. Interval pedicle screw and interbody fusion at L3-4. Interval   development of bilateral pars defects at L3. No significant stenosis   2. Solid fusion L4-5 and L5-S1. Mild to moderate subarticular and   foraminal stenosis bilaterally L5-S1 due to spurring.    PATIENT SURVEYS:  FOTO 36%  SCREENING FOR RED FLAGS: Bowel or bladder incontinence: No Spinal tumors: No Cauda equina syndrome: No Compression fracture: No Abdominal aneurysm: No  COGNITION: Overall cognitive status: Within functional limits for tasks assessed     SENSATION: Vague numbness in LEs, feet  MUSCLE LENGTH: NT    POSTURE: rounded shoulders, forward head,  flexed trunk , and genu varus , knees flexed  PALPATION: L knee swollen> Rt.  Min tenderness in thoracolumbar spine Pain L L5-S1 and extending outward across superior glutes , Lt lateral hip and thigh   LUMBAR ROM:   AROM Eval 09/24/22 11/03/22  Flexion 80% limited pain Reaches just below knees, low back pain    Extension 80% limited pain  75% limited min pain   Right lateral flexion 50% pain R side   Better than to the L , still 25% or more limited   Left lateral flexion 50% pain L side  50% no pain   Right rotation 25% min pain No pain WNL   Left rotation 25% min pain  No pain  WNL    (Blank rows = not tested)  LOWER EXTREMITY ROM:     Active  Right eval Left eval  Hip flexion    Hip extension    Hip abduction    Hip adduction    Hip internal rotation    Hip external rotation    Knee flexion 115 120+  Knee extension -10 -10  Ankle dorsiflexion    Ankle plantarflexion    Ankle inversion    Ankle eversion     (Blank rows = not tested)  LOWER EXTREMITY MMT:    MMT Right eval Left eval  Hip flexion 3-/5 3-/5  Hip extension NT NT   Hip abduction 3/5 3/5  Hip adduction    Hip internal rotation    Hip external rotation    Knee flexion 5/5 5/5  Knee extension 5/5 5/5  Ankle dorsiflexion 5/5 5/5  Ankle plantarflexion    Ankle inversion    Ankle eversion     (Blank rows = not tested)  LUMBAR SPECIAL TESTS:  Straight leg raise test: Positive Lt.   FUNCTIONAL TESTS:  5 times sit to stand: unable to complete > 2 reps due to pain in L back and bilateral knees, 2 reps about 30 sec with hands  09/29/22: 2 min walk test: 398 no rest break , o2 sats 97% and HR 125,  knee pain limited her- 8/10 pain  GAIT: Distance walked: 75 Assistive device utilized: None Level of assistance: Modified independence Comments: slow pace, antalgic gait    TODAY'S TREATMENT:      [facility] Adult PT Treatment:                                                DATE: 11/05/22 Therapeutic  Exercise: Treadmill 1.5 mph for 8 min  Supine core :breathing/Pilates ring squeeze x 10  Supine march with ring x 10  Lower trunk rotation x 10 reset  Bridge in parallel x 15 , 3 sec hold  SLR  x 10  Sidelying x 10  Tandem on Airex for core, head turns  Narrow stance with single arm lateral and forward raise, 5 lbs    [facility] Adult PT Treatment:                                                DATE: 11/03/22 Therapeutic Exercise: Nustep 6 min L 5 UE and LE  Treadmill up to 1.5 mph and 2 % grade for 6 min  Leg press 1 plate double leg x 20 2 sets, added slight turnout  Knee extension 20 lbs x 2  Knee flexion 25 lbs x 15  Row 25 lbs x 10  Lat pull down x 25  x 10  Trunk motion standing and then seated rotation and sidebending  Self Care: Principles of progressive overload and gym exercises  Recommended machines for beginner and improved safety.   POC    [facility] Adult PT Treatment:                                                DATE: 10/31/22 Therapeutic Exercise: Nustep L5 UE and LE for 6 min  Calf stretch for lower leg 2 x 30 sec on wall Bilateral shoulder extension with ball squeeze x 10 blue band  Lower trunk rotation Physio ball hamstring curls Physio ball bridge  Core press with ball opposite arm on opp thigh x 5  Added SLR Rt LE painful Standing hip abduction /ext. X 10  Hip flexor stretch 30 sec x 2  FOTO done   [facility] Adult PT Treatment:                                                DATE: 10/22/22 Therapeutic Exercise: NuStep L5 UE and LE x 7 min  Physioball: LTR x 10, hamstring curls  x 15  Bridge x10 Sit to stand no weight today x10 - pain in knees and back Supine PPT with alternating BKFO BlueTB x10 - cues for pacing and sequencing    [facility] Adult PT Treatment:  DATE: 10/20/22 Therapeutic Exercise: NuStep L5 UE and LE , 7 min  Physioball: LTR x 10, hamstring curls  x 15 and bridges Hamstring stretch 3 x 20 sec  Piriformis  stretch with towel to assist  Sit to stand 10 lbs x 8, then x 10 without wgt per pt request Hip hinge with blue band on feet  x 15 , min cues    [facility] Adult PT Treatment:                                                DATE: 10/14/22 Therapeutic Exercise: NuStep L4 UE and LE for 6 min   Spine mobility in multiple positions for NM re-education  Seated on physio ball hands on table PPT Alt. UE lift then double arm  Upper arm rotation x 5 Standing core press with physioball on high mat x 10  Heel raise x 10 holding ball  Hip extension high mat  x 15  Therapeutic Activity: Bed mobility supine to sit to supine    PATIENT EDUCATION:  Education details: PT, POC, HEP, core/breathing, pool , sit to stand  Person educated: Patient Education method: Consulting civil engineer, Demonstration, Verbal cues, and Handouts Education comprehension: verbalized understanding and needs further education  HOME EXERCISE PROGRAM: Access Code: [email or web address] URL: [email or web address] Date: 09/24/2022 Prepared by: Raeford Razor  ASSESSMENT:  CLINICAL IMPRESSION: Patient able to report no increased back pain following a gym-type workout Monday.  She has positive feedback with flexion based core exercises.  She declined squat and sit to stand because she didn't want to further aggravate her knees. Following up with Dr. Christella Noa next week.  Cont POC.   OBJECTIVE IMPAIRMENTS: cardiopulmonary status limiting activity, decreased activity tolerance, decreased endurance, decreased knowledge of use of DME, decreased mobility, difficulty walking, decreased ROM, decreased strength, increased edema, increased fascial restrictions, impaired flexibility, impaired sensation, improper body mechanics, postural dysfunction, obesity, and pain.   ACTIVITY LIMITATIONS: carrying, lifting, bending, sitting, standing, squatting, sleeping, stairs, transfers, bed mobility, locomotion level, and caring for others  PARTICIPATION  LIMITATIONS: meal prep, cleaning, laundry, interpersonal relationship, driving, shopping, and community activity  PERSONAL FACTORS: Time since onset of injury/illness/exacerbation and 3+ comorbidities: previous back surgery, obesity, diabetes   are also affecting patient's functional outcome.   REHAB POTENTIAL: Good  CLINICAL DECISION MAKING: Evolving/moderate complexity  EVALUATION COMPLEXITY: Moderate   GOALS: Goals reviewed with patient? Yes  SHORT TERM GOALS: Target date: 10/22/2022    Pt will be I with HEP for core, posture and LE strength/mobility  Baseline: Goal status: MET   2.  Pt will be able to complete functional assessment and set goal (sit to stand, gait)  Baseline: unable to complete due to knee pain  Goal status: MET   3.  Pt will be able to perform bed mobility and chair/mat transfers with no increase in pain  Baseline:  Goal status: MET   4.  Pt will be able to stand for 10 min for light housework with pain <6/10 Baseline: 8/10+ Goal status: Ongoing   LONG TERM GOALS: Target date: 11/19/2022    Pt will be able to improve FOTO score to 47% or greater to demo improved functional mobility.  Baseline: 36% Goal status: INITIAL  2.  Pt will be able to improve hip strength to 4+/5 or better for improved back support with transfers and gait  Baseline:  Goal status: INITIAL  3.  Pt will be able to walk in the grocery store for 30 min with min increase in pain from baseline, using cart for support  Baseline: mod to severe  Goal status: INITIAL  4.  Pt will be able to perform proper hip hinge to improve safety with light home tasks (vs bending) and improved spine stability  Baseline:  Goal status: INITIAL  5.  Pt will be I with HEP upon discharge  Baseline:  Goal status: INITIAL  6.  Pt will be able to walk 450 feet in 2 min walk test with knee pain no more than 6/10, no increase in back pain .  Baseline: 398, knee 8/10 Goal status:  INITIAL  PLAN:  PT FREQUENCY: 2x/week  PT DURATION: 8 weeks  PLANNED INTERVENTIONS: Therapeutic exercises, Therapeutic activity, Neuromuscular re-education, Balance training, Gait training, Patient/Family education, Self Care, Aquatic Therapy, Dry Needling, Electrical stimulation, Cryotherapy, Moist heat, Manual lymph drainage, Manual therapy, and Re-evaluation.  PLAN FOR NEXT SESSION : check goals. add to HEP.  Standing vs Seated core, Nustep.  Sit to stand , (functional testing)   Yassir Enis, PT 11/07/2022, 12:08 PM  Raeford Razor, PT 11/07/22 12:08 PM Phone: [phone] Fax: [phone]

## 2022-11-08 ENCOUNTER — Other Ambulatory Visit: Payer: Self-pay | Admitting: Acute Care

## 2022-11-08 DIAGNOSIS — F17210 Nicotine dependence, cigarettes, uncomplicated: Secondary | ICD-10-CM

## 2022-11-08 DIAGNOSIS — Z122 Encounter for screening for malignant neoplasm of respiratory organs: Secondary | ICD-10-CM

## 2022-11-08 DIAGNOSIS — Z87891 Personal history of nicotine dependence: Secondary | ICD-10-CM

## 2022-11-10 ENCOUNTER — Ambulatory Visit: Payer: 59 | Admitting: Physical Therapy

## 2022-11-10 DIAGNOSIS — M5459 Other low back pain: Secondary | ICD-10-CM

## 2022-11-10 DIAGNOSIS — M5415 Radiculopathy, thoracolumbar region: Secondary | ICD-10-CM

## 2022-11-10 DIAGNOSIS — M6281 Muscle weakness (generalized): Secondary | ICD-10-CM

## 2022-11-11 NOTE — Therapy (Unsigned)
OUTPATIENT PHYSICAL THERAPY THORACOLUMBAR TREATMENT   Patient Name: Shannon [name] MRN: [id number] DOB:1955/09/26, 67 y.o., female Today's Date: 11/12/2022  END OF SESSION:  PT End of Session - 11/12/22 1057     Visit Number 13    Number of Visits 16    Date for PT Re-Evaluation 11/19/22    Authorization Type U[employer] , MCD    PT Start Time 12                   Past Medical History:  Diagnosis Date   Arthritis    Back pain    Constipation    Depression    Fibromyalgia    Headache    Herniated disc    Hypertension    Joint pain    Knee pain    Lower extremity edema    Pre-diabetes    Prediabetes    Restless leg syndrome    Sarcoidosis 2000   Sciatica    Scoliosis    Snoring    SOB (shortness of breath)    Vitamin D deficiency    Past Surgical History:  Procedure Laterality Date   ABDOMINAL HYSTERECTOMY  1999   APPENDECTOMY  2019   BACK SURGERY     BREAST BIOPSY Right 12/2016   LAPAROSCOPIC APPENDECTOMY N/A 10/28/2017   Procedure: APPENDECTOMY LAPAROSCOPIC;  Surgeon: Leighton Ruff, MD;  Location: [facility];  Service: General;  Laterality: N/A;   TUBAL LIGATION     Patient Active Problem List   Diagnosis Date Noted   Lumbar adjacent segment disease with spondylolisthesis 02/26/2022   S/P lumbar spinal fusion 06/28/2021   Spondylolisthesis of lumbar region 06/28/2021   Constipation 02/21/2021   Diabetes mellitus ([place]) 11/26/2020   Chronic pain syndrome 11/26/2020   Appendicitis 10/28/2017   Cigarette smoker 07/26/2015   Dyspnea 05/29/2015   BACK PAIN, LUMBAR 05/29/2008   SARCOIDOSIS 12/21/2007   FATIGUE, CHRONIC 12/21/2007   RECTAL BLEEDING 12/14/2007   NECK PAIN, ACUTE 12/14/2007   ABDOMINAL PAIN, GENERALIZED 12/14/2007   DEPRESSION 05/13/2007   Hypertension associated with type 2 diabetes mellitus ([place]) 05/13/2007   PERIODONTAL DISEASE 05/13/2007   HEMATURIA 05/13/2007   ALLERGY 05/13/2007   Class 3 severe obesity with serious comorbidity  and body mass index (BMI) of 40.0 to 44.9 in adult ([place]) 05/07/2007    PCP: Cletis Athens NP  REFERRING PROVIDER: Dr. Christella Noa   REFERRING DIAG: S/P Fusion, Spondylolisthesis  Rationale for Evaluation and Treatment: Rehabilitation  THERAPY DIAG:  Other low back pain  Muscle weakness (generalized)  Radiculopathy, thoracolumbar region  ONSET DATE: 02/26/2022  SUBJECTIVE:  SUBJECTIVE STATEMENT:  I did a lot of work yesterday in my house, cleaning out closets and walking around the house, moving things. My back is ok, 4/10.    PERTINENT HISTORY:  Pre-diabetic, 06/28/2021: L4-L5 , L5-S1  Posterior lumbar interbody fusion  02/26/22:  L3-L4 PLIF See above   PAIN:  Are you having pain? Yes: NPRS scale: 5/10 Pain location: thoracolumbar spine and into posterior thighs  Pain description: vice, turning  Aggravating factors: standing  Relieving factors: sitting and laying down Ibuprofen, Tylenol, ice on back  PRECAUTIONS: None  WEIGHT BEARING RESTRICTIONS: No  FALLS:  Has patient fallen in last 6 months? No  LIVING ENVIRONMENT: Lives with: lives with their partner Lives in: House/apartment Stairs: No Has following equipment at home: None  OCCUPATION: Not working, was a patient transport Training and development officer)  PLOF: Independent  PATIENT GOALS: I want to feel better, get better and walk again. Lose wgt.   NEXT MD VISIT:   OBJECTIVE:   DIAGNOSTIC FINDINGS:  09/2022: CT IMPRESSION:   1. Interval pedicle screw and interbody fusion at L3-4. Interval   development of bilateral pars defects at L3. No significant stenosis   2. Solid fusion L4-5 and L5-S1. Mild to moderate subarticular and   foraminal stenosis bilaterally L5-S1 due to spurring.    PATIENT SURVEYS:  FOTO 36% 10/31/22" 57% DC from  [place] FLAGS: Bowel or bladder incontinence: No Spinal tumors: No Cauda equina syndrome: No Compression fracture: No Abdominal aneurysm: No  COGNITION: Overall cognitive status: Within functional limits for tasks assessed     SENSATION: Vague numbness in LEs, feet  MUSCLE LENGTH: NT    POSTURE: rounded shoulders, forward head, flexed trunk , and genu varus , knees flexed  PALPATION: L knee swollen> Rt.  Min tenderness in thoracolumbar spine Pain L L5-S1 and extending outward across superior glutes , Lt lateral hip and thigh   LUMBAR ROM:   AROM Eval 09/24/22 11/03/22  Flexion 80% limited pain Reaches just below knees, low back pain    Extension 80% limited pain  75% limited min pain   Right lateral flexion 50% pain R side   Better than to the L , still 25% or more limited   Left lateral flexion 50% pain L side  50% no pain   Right rotation 25% min pain No pain WNL   Left rotation 25% min pain  No pain  WNL    (Blank rows = not tested)  LOWER EXTREMITY ROM:     Active  Right eval Left eval  Hip flexion    Hip extension    Hip abduction    Hip adduction    Hip internal rotation    Hip external rotation    Knee flexion 115 120+  Knee extension -10 -10  Ankle dorsiflexion    Ankle plantarflexion    Ankle inversion    Ankle eversion     (Blank rows = not tested)  LOWER EXTREMITY MMT:    MMT Right eval Left eval  Hip flexion 3-/5 3-/5  Hip extension NT NT   Hip abduction 3/5 3/5  Hip adduction    Hip internal rotation    Hip external rotation    Knee flexion 5/5 5/5  Knee extension 5/5 5/5  Ankle dorsiflexion 5/5 5/5  Ankle plantarflexion    Ankle inversion    Ankle eversion     (Blank rows = not tested)  LUMBAR SPECIAL TESTS:  Straight leg raise test: Positive  Lt.   FUNCTIONAL TESTS:  5 times sit to stand: unable to complete > 2 reps due to pain in L back and bilateral knees, 2 reps about 30 sec with hands  09/29/22: 2 min  walk test: 398 no rest break , o2 sats 97% and HR 125, knee pain limited her- 8/10 pain  GAIT: Distance walked: 75 Assistive device utilized: None Level of assistance: Modified independence Comments: slow pace, antalgic gait    TODAY'S TREATMENT:      [facility] Adult PT Treatment:                                                DATE: 11/12/22 Therapeutic Exercise: NuStep L6 UE and LE  Ambulation 2 laps with 10 lbs KB (suitcase carry)  Standing partial squat at sink x 15 Airex pad for balance: head turns, upper trunk rotation Side facing march with and without KB Standing core press out with 10 KB  x 10 on airex Posterior pelvic tilt  Supine LTR with physio ball  Partial bridge with physioball x 10 Hamstring curl with ball  Knee to chest with sheet x 45 sec , crossed foot onto opp leg (outer hip stretch) x 45 sec   [facility] Adult PT Treatment:                                                DATE: 11/10/22 Therapeutic Exercise: Nustep L5 UE and LE for 7 min  Sidelying upper trunk x 8  Clam x 15 Hip abduction x 10  Bridging  x 10 Lower trunk rotation x 10  Supine lower abs : Shoulder extension x 10 blue band added knee lift, added bridge (painful) Standing 10 lbs KB march x 15 Standing wall squat x with black physio ball small ROM x 15 Hip extension belly on ball on high mat table x 10 added opposite arm reach for core challenge x 5     [facility] Adult PT Treatment:                                                DATE: 11/05/22 Therapeutic Exercise: Treadmill 1.5 mph for 8 min  Supine core :breathing/Pilates ring squeeze x 10  Supine march with ring x 10  Lower trunk rotation x 10 reset  Bridge in parallel x 15 , 3 sec hold  SLR  x 10  Sidelying x 10  Tandem on Airex for core, head turns  Narrow stance with single arm lateral and forward raise, 5 lbs    [facility] Adult PT Treatment:                                                DATE: 11/03/22 Therapeutic Exercise: Nustep 6 min L 5 UE and LE   Treadmill up to 1.5 mph and 2 % grade for 6 min  Leg press 1 plate double leg x 20 2 sets, added slight turnout  Knee  extension 20 lbs x 2  Knee flexion 25 lbs x 15  Row 25 lbs x 10  Lat pull down x 25  x 10  Trunk motion standing and then seated rotation and sidebending  Self Care: Principles of progressive overload and gym exercises  Recommended machines for beginner and improved safety.   POC    [facility] Adult PT Treatment:                                                DATE: 10/31/22 Therapeutic Exercise: Nustep L5 UE and LE for 6 min  Calf stretch for lower leg 2 x 30 sec on wall Bilateral shoulder extension with ball squeeze x 10 blue band  Lower trunk rotation Physio ball hamstring curls Physio ball bridge  Core press with ball opposite arm on opp thigh x 5  Added SLR Rt LE painful Standing hip abduction /ext. X 10  Hip flexor stretch 30 sec x 2  FOTO done     PATIENT EDUCATION:  Education details: PT, POC, HEP, core/breathing, pool , sit to stand  Person educated: Patient Education method: Consulting civil engineer, Demonstration, Verbal cues, and Handouts Education comprehension: verbalized understanding and needs further education  HOME EXERCISE PROGRAM: Access Code: [email or web address] URL: [email or web address] Date: 09/24/2022 Prepared by: Raeford Razor  ASSESSMENT:  CLINICAL IMPRESSION: Patient tolerated session well.  Able to stand for 10-12 minutes with min increase in back pain for core work. She did have increased back pain when loaded unilaterally for gait. She reports no pain in her back after session.  Responds very well to flexion based core and stretching.     OBJECTIVE IMPAIRMENTS: cardiopulmonary status limiting activity, decreased activity tolerance, decreased endurance, decreased knowledge of use of DME, decreased mobility, difficulty walking, decreased ROM, decreased strength, increased edema, increased fascial restrictions, impaired flexibility, impaired  sensation, improper body mechanics, postural dysfunction, obesity, and pain.   ACTIVITY LIMITATIONS: carrying, lifting, bending, sitting, standing, squatting, sleeping, stairs, transfers, bed mobility, locomotion level, and caring for others  PARTICIPATION LIMITATIONS: meal prep, cleaning, laundry, interpersonal relationship, driving, shopping, and community activity  PERSONAL FACTORS: Time since onset of injury/illness/exacerbation and 3+ comorbidities: previous back surgery, obesity, diabetes   are also affecting patient's functional outcome.   REHAB POTENTIAL: Good  CLINICAL DECISION MAKING: Evolving/moderate complexity  EVALUATION COMPLEXITY: Moderate   GOALS: Goals reviewed with patient? Yes  SHORT TERM GOALS: Target date: 10/22/2022    Pt will be I with HEP for core, posture and LE strength/mobility  Baseline: Goal status: MET   2.  Pt will be able to complete functional assessment and set goal (sit to stand, gait)  Baseline: unable to complete due to knee pain  Goal status: MET   3.  Pt will be able to perform bed mobility and chair/mat transfers with no increase in pain  Baseline:  Goal status: MET   4.  Pt will be able to stand for 10 min for light housework with pain <6/10 Baseline: 8/10+ Goal status: Ongoing   LONG TERM GOALS: Target date: 11/19/2022    Pt will be able to improve FOTO score to 47% or greater to demo improved functional mobility.  Baseline: 36% Goal status: INITIAL  2.  Pt will be able to improve hip strength to 4+/5 or better for improved back support with transfers and gait  Baseline:  Goal status:  INITIAL  3.  Pt will be able to walk in the grocery store for 30 min with min increase in pain from baseline, using cart for support  Baseline: mod to severe  Goal status: INITIAL  4.  Pt will be able to perform proper hip hinge to improve safety with light home tasks (vs bending) and improved spine stability  Baseline:  Goal status:  INITIAL  5.  Pt will be I with HEP upon discharge  Baseline:  Goal status: INITIAL  6.  Pt will be able to walk 450 feet in 2 min walk test with knee pain no more than 6/10, no increase in back pain .  Baseline: 398, knee 8/10 Goal status: INITIAL  PLAN:  PT FREQUENCY: 2x/week  PT DURATION: 8 weeks  PLANNED INTERVENTIONS: Therapeutic exercises, Therapeutic activity, Neuromuscular re-education, Balance training, Gait training, Patient/Family education, Self Care, Aquatic Therapy, Dry Needling, Electrical stimulation, Cryotherapy, Moist heat, Manual lymph drainage, Manual therapy, and Re-evaluation.  PLAN FOR NEXT SESSION : progress towards goals. add to HEP.  Standing vs Seated core, Nustep.  Sit to stand , (functional testing)  Raeford Razor, PT 11/12/22 11:50 AM Phone: [phone] Fax: [phone]

## 2022-11-12 ENCOUNTER — Encounter: Payer: Self-pay | Admitting: Physical Therapy

## 2022-11-12 ENCOUNTER — Ambulatory Visit: Payer: 59 | Admitting: Physical Therapy

## 2022-11-12 DIAGNOSIS — M5459 Other low back pain: Secondary | ICD-10-CM | POA: Diagnosis not present

## 2022-11-12 DIAGNOSIS — M5415 Radiculopathy, thoracolumbar region: Secondary | ICD-10-CM

## 2022-11-12 DIAGNOSIS — M6281 Muscle weakness (generalized): Secondary | ICD-10-CM

## 2022-11-17 ENCOUNTER — Encounter: Payer: Self-pay | Admitting: Physical Therapy

## 2022-11-17 ENCOUNTER — Ambulatory Visit: Payer: 59 | Admitting: Physical Therapy

## 2022-11-17 DIAGNOSIS — M6281 Muscle weakness (generalized): Secondary | ICD-10-CM

## 2022-11-17 DIAGNOSIS — M5415 Radiculopathy, thoracolumbar region: Secondary | ICD-10-CM

## 2022-11-17 DIAGNOSIS — M5459 Other low back pain: Secondary | ICD-10-CM | POA: Diagnosis not present

## 2022-11-17 NOTE — Therapy (Signed)
OUTPATIENT PHYSICAL THERAPY THORACOLUMBAR TREATMENT   Patient Name: Shannon [name] MRN: [id number] DOB:[birth year], 67 y.o., female Today's Date: 11/17/2022  END OF SESSION:  PT End of Session - 11/17/22 1334     Visit Number 14    Number of Visits 16    Date for PT Re-Evaluation 11/19/22    Authorization Type UHC , MCD    PT Start Time 1330    PT Stop Time 1420    PT Time Calculation (min) 50 min    Activity Tolerance Patient tolerated treatment well    Behavior During Therapy WFL for tasks assessed/performed                    Past Medical History:  Diagnosis Date   Arthritis    Back pain    Constipation    Depression    Fibromyalgia    Headache    Herniated disc    Hypertension    Joint pain    Knee pain    Lower extremity edema    Pre-diabetes    Prediabetes    Restless leg syndrome    Sarcoidosis 2000   Sciatica    Scoliosis    Snoring    SOB (shortness of breath)    Vitamin D deficiency    Past Surgical History:  Procedure Laterality Date   ABDOMINAL HYSTERECTOMY  1999   APPENDECTOMY  2019   BACK SURGERY     BREAST BIOPSY Right 12/2016   LAPAROSCOPIC APPENDECTOMY N/A 10/28/2017   Procedure: APPENDECTOMY LAPAROSCOPIC;  Surgeon: Leighton Ruff, MD;  Location: [facility];  Service: General;  Laterality: N/A;   TUBAL LIGATION     Patient Active Problem List   Diagnosis Date Noted   Lumbar adjacent segment disease with spondylolisthesis 02/26/2022   S/P lumbar spinal fusion 06/28/2021   Spondylolisthesis of lumbar region 06/28/2021   Constipation 02/21/2021   Diabetes mellitus ([place]) 11/26/2020   Chronic pain syndrome 11/26/2020   Appendicitis 10/28/2017   Cigarette smoker 07/26/2015   Dyspnea 05/29/2015   BACK PAIN, LUMBAR 05/29/2008   SARCOIDOSIS 12/21/2007   FATIGUE, CHRONIC 12/21/2007   RECTAL BLEEDING 12/14/2007   NECK PAIN, ACUTE 12/14/2007   ABDOMINAL PAIN, GENERALIZED 12/14/2007   DEPRESSION 05/13/2007   Hypertension associated  with type 2 diabetes mellitus ([place]) 05/13/2007   PERIODONTAL DISEASE 05/13/2007   HEMATURIA 05/13/2007   ALLERGY 05/13/2007   Class 3 severe obesity with serious comorbidity and body mass index (BMI) of 40.0 to 44.9 in adult ([place]) 05/07/2007    PCP: Cletis Athens NP  REFERRING PROVIDER: Dr. Christella Noa   REFERRING DIAG: S/P Fusion, Spondylolisthesis  Rationale for Evaluation and Treatment: Rehabilitation  THERAPY DIAG:  Other low back pain  Muscle weakness (generalized)  Radiculopathy, thoracolumbar region  ONSET DATE: 02/26/2022  SUBJECTIVE:  SUBJECTIVE STATEMENT:  My back felt so good after last time.  I dont have to see Dr. Christella Noa for another 4 mos.  He talked to me about my knees. He wants me to go back to [facility] OR see the other doctors (gel injections). My back is still stiff but when I about to hurt a lot I sit.  I lift like I am supposed to and doing more.   PERTINENT HISTORY:  Pre-diabetic, 06/28/2021: L4-L5 , L5-S1  Posterior lumbar interbody fusion  02/26/22:  L3-L4 PLIF See above   PAIN:  Are you having pain? Yes: NPRS scale: 3/10.  NONE AT REST.   Pain location: thoracolumbar spine and into posterior thighs  Pain description: vice, turning  Aggravating factors: standing  Relieving factors: sitting and laying down Ibuprofen, Tylenol, ice on back  PRECAUTIONS: None  WEIGHT BEARING RESTRICTIONS: No  FALLS:  Has patient fallen in last 6 months? No  LIVING ENVIRONMENT: Lives with: lives with their partner Lives in: House/apartment Stairs: No Has following equipment at home: None  OCCUPATION: Not working, was a patient transport Training and development officer)  PLOF: Independent  PATIENT GOALS: I want to feel better, get better and walk again. Lose wgt.   NEXT MD VISIT:   OBJECTIVE:    DIAGNOSTIC FINDINGS:  09/2022: CT IMPRESSION:   1. Interval pedicle screw and interbody fusion at L3-4. Interval   development of bilateral pars defects at L3. No significant stenosis   2. Solid fusion L4-5 and L5-S1. Mild to moderate subarticular and   foraminal stenosis bilaterally L5-S1 due to spurring.    PATIENT SURVEYS:  FOTO 36% 10/31/22" 57% DC from [facility] FLAGS: Bowel or bladder incontinence: No Spinal tumors: No Cauda equina syndrome: No Compression fracture: No Abdominal aneurysm: No  COGNITION: Overall cognitive status: Within functional limits for tasks assessed     SENSATION: Vague numbness in LEs, feet  MUSCLE LENGTH: NT    POSTURE: rounded shoulders, forward head, flexed trunk , and genu varus , knees flexed  PALPATION: L knee swollen> Rt.  Min tenderness in thoracolumbar spine Pain L L5-S1 and extending outward across superior glutes , Lt lateral hip and thigh   LUMBAR ROM:   AROM Eval 09/24/22 11/03/22  Flexion 80% limited pain Reaches just below knees, low back pain    Extension 80% limited pain  75% limited min pain   Right lateral flexion 50% pain R side   Better than to the L , still 25% or more limited   Left lateral flexion 50% pain L side  50% no pain   Right rotation 25% min pain No pain WNL   Left rotation 25% min pain  No pain  WNL    (Blank rows = not tested)  LOWER EXTREMITY ROM:     Active  Right eval Left eval  Hip flexion    Hip extension    Hip abduction    Hip adduction    Hip internal rotation    Hip external rotation    Knee flexion 115 120+  Knee extension -10 -10  Ankle dorsiflexion    Ankle plantarflexion    Ankle inversion    Ankle eversion     (Blank rows = not tested)  LOWER EXTREMITY MMT:    MMT Right eval Left eval  Hip flexion 3-/5 3-/5  Hip extension NT NT   Hip abduction 3/5 3/5  Hip adduction    Hip internal rotation    Hip external  rotation    Knee flexion 5/5 5/5   Knee extension 5/5 5/5  Ankle dorsiflexion 5/5 5/5  Ankle plantarflexion    Ankle inversion    Ankle eversion     (Blank rows = not tested)  LUMBAR SPECIAL TESTS:  Straight leg raise test: Positive Lt.   FUNCTIONAL TESTS:  5 times sit to stand: unable to complete > 2 reps due to pain in L back and bilateral knees, 2 reps about 30 sec with hands  09/29/22: 2 min walk test: 398 no rest break , o2 sats 97% and HR 125, knee pain limited her- 8/10 pain  GAIT: Distance walked: 75 Assistive device utilized: None Level of assistance: Modified independence Comments: slow pace, antalgic gait    TODAY'S TREATMENT:     [facility] Adult PT Treatment:                                                DATE: 11/17/22 Therapeutic Exercise: Nu Step L 5 UE and LE for 8 min  Combo hip strengthening:  Hip abduction , extension and flexion 1 min each in standing with  1 UE assist  Deep supported squat  x 15 Unsupported squat  x 15  Toe taps 4 inch step downs glutes  x 15 Supine abdominal bracing  Added SLR with Pilates circle  Hamstring and ITB 60 sec x 2 each LE with strap Ball bridge x 10  LTR x 10    [facility] Adult PT Treatment:                                                DATE: 11/12/22 Therapeutic Exercise: NuStep L6 UE and LE  Ambulation 2 laps with 10 lbs KB (suitcase carry)  Standing partial squat at sink x 15 Airex pad for balance: head turns, upper trunk rotation Side facing march with and without KB Standing core press out with 10 KB  x 10 on airex Posterior pelvic tilt  Supine LTR with physio ball  Partial bridge with physioball x 10 Hamstring curl with ball  Knee to chest with sheet x 45 sec , crossed foot onto opp leg (outer hip stretch) x 45 sec   [facility] Adult PT Treatment:                                                DATE: 11/10/22 Therapeutic Exercise: Nustep L5 UE and LE for 7 min  Sidelying upper trunk x 8  Clam x 15 Hip abduction x 10  Bridging  x 10 Lower trunk rotation x  10  Supine lower abs : Shoulder extension x 10 blue band added knee lift, added bridge (painful) Standing 10 lbs KB march x 15 Standing wall squat x with black physio ball small ROM x 15 Hip extension belly on ball on high mat table x 10 added opposite arm reach for core challenge x 5     [facility] Adult PT Treatment:  DATE: 11/05/22 Therapeutic Exercise: Treadmill 1.5 mph for 8 min  Supine core :breathing/Pilates ring squeeze x 10  Supine march with ring x 10  Lower trunk rotation x 10 reset  Bridge in parallel x 15 , 3 sec hold  SLR  x 10  Sidelying x 10  Tandem on Airex for core, head turns  Narrow stance with single arm lateral and forward raise, 5 lbs    [facility] Adult PT Treatment:                                                DATE: 11/03/22 Therapeutic Exercise: Nustep 6 min L 5 UE and LE  Treadmill up to 1.5 mph and 2 % grade for 6 min  Leg press 1 plate double leg x 20 2 sets, added slight turnout  Knee extension 20 lbs x 2  Knee flexion 25 lbs x 15  Row 25 lbs x 10  Lat pull down x 25  x 10  Trunk motion standing and then seated rotation and sidebending  Self Care: Principles of progressive overload and gym exercises  Recommended machines for beginner and improved safety.   POC    [facility] Adult PT Treatment:                                                DATE: 10/31/22 Therapeutic Exercise: Nustep L5 UE and LE for 6 min  Calf stretch for lower leg 2 x 30 sec on wall Bilateral shoulder extension with ball squeeze x 10 blue band  Lower trunk rotation Physio ball hamstring curls Physio ball bridge  Core press with ball opposite arm on opp thigh x 5  Added SLR Rt LE painful Standing hip abduction /ext. X 10  Hip flexor stretch 30 sec x 2  FOTO done     PATIENT EDUCATION:  Education details: PT, POC, HEP, core/breathing, pool , sit to stand  Person educated: Patient Education method: Consulting civil engineer, Demonstration, Verbal cues,  and Handouts Education comprehension: verbalized understanding and needs further education  HOME EXERCISE PROGRAM: Access Code: [email or web address] URL: [email or web address] Date: 09/24/2022 Prepared by: Raeford Razor  ASSESSMENT:  CLINICAL IMPRESSION: Patient continues to be able to do more in her home and maintain a low level of pain overall.  She has no pain at rest, feels a stiffness in her back with certain activities in her home (standing).  She feels good relief with legs up on physio ball in hooklying. She has 1 more session in her POC and will consider seeing Ortho for her knees.   OBJECTIVE IMPAIRMENTS: cardiopulmonary status limiting activity, decreased activity tolerance, decreased endurance, decreased knowledge of use of DME, decreased mobility, difficulty walking, decreased ROM, decreased strength, increased edema, increased fascial restrictions, impaired flexibility, impaired sensation, improper body mechanics, postural dysfunction, obesity, and pain.   ACTIVITY LIMITATIONS: carrying, lifting, bending, sitting, standing, squatting, sleeping, stairs, transfers, bed mobility, locomotion level, and caring for others  PARTICIPATION LIMITATIONS: meal prep, cleaning, laundry, interpersonal relationship, driving, shopping, and community activity  PERSONAL FACTORS: Time since onset of injury/illness/exacerbation and 3+ comorbidities: previous back surgery, obesity, diabetes   are also affecting patient's functional outcome.   REHAB POTENTIAL: Good  CLINICAL DECISION MAKING: Evolving/moderate complexity  EVALUATION COMPLEXITY: Moderate   GOALS: Goals reviewed with patient? Yes  SHORT TERM GOALS: Target date: 10/22/2022    Pt will be I with HEP for core, posture and LE strength/mobility  Baseline: Goal status: MET   2.  Pt will be able to complete functional assessment and set goal (sit to stand, gait)  Baseline: unable to complete due to knee pain  Goal status: MET    3.  Pt will be able to perform bed mobility and chair/mat transfers with no increase in pain  Baseline:  Goal status: MET   4.  Pt will be able to stand for 10 min for light housework with pain <6/10 Baseline: 8/10+ Goal status: Ongoing   LONG TERM GOALS: Target date: 11/19/2022    Pt will be able to improve FOTO score to 47% or greater to demo improved functional mobility.  Baseline: 36% Goal status: INITIAL  2.  Pt will be able to improve hip strength to 4+/5 or better for improved back support with transfers and gait  Baseline:  Goal status: INITIAL  3.  Pt will be able to walk in the grocery store for 30 min with min increase in pain from baseline, using cart for support  Baseline: mod to severe  Goal status: INITIAL  4.  Pt will be able to perform proper hip hinge to improve safety with light home tasks (vs bending) and improved spine stability  Baseline:  Goal status: INITIAL  5.  Pt will be I with HEP upon discharge  Baseline:  Goal status: INITIAL  6.  Pt will be able to walk 450 feet in 2 min walk test with knee pain no more than 6/10, no increase in back pain .  Baseline: 398, knee 8/10 Goal status: INITIAL  PLAN:  PT FREQUENCY: 2x/week  PT DURATION: 8 weeks  PLANNED INTERVENTIONS: Therapeutic exercises, Therapeutic activity, Neuromuscular re-education, Balance training, Gait training, Patient/Family education, Self Care, Aquatic Therapy, Dry Needling, Electrical stimulation, Cryotherapy, Moist heat, Manual lymph drainage, Manual therapy, and Re-evaluation.  PLAN FOR NEXT SESSION : goals, finish POC , discharge   Raeford Razor, PT 11/17/22 2:42 PM Phone: [phone] Fax: [phone]

## 2022-11-18 NOTE — Therapy (Unsigned)
OUTPATIENT PHYSICAL THERAPY THORACOLUMBAR TREATMENT DISCHARGE   Patient Name: Shannon [name] MRN: [id number] DOB:1956-05-03, 67 y.o., female Today's Date: 11/19/2022  END OF SESSION:  PT End of Session - 11/19/22 1419     Visit Number 15    Number of Visits 16    Date for PT Re-Evaluation 11/19/22    Authorization Type [employer] , MCD    PT Start Time [id number]    PT Stop Time 1500    PT Time Calculation (min) 40 min    Activity Tolerance Patient tolerated treatment well    Behavior During Therapy WFL for tasks assessed/performed                     Past Medical History:  Diagnosis Date   Arthritis    Back pain    Constipation    Depression    Fibromyalgia    Headache    Herniated disc    Hypertension    Joint pain    Knee pain    Lower extremity edema    Pre-diabetes    Prediabetes    Restless leg syndrome    Sarcoidosis 2000   Sciatica    Scoliosis    Snoring    SOB (shortness of breath)    Vitamin D deficiency    Past Surgical History:  Procedure Laterality Date   ABDOMINAL HYSTERECTOMY  1999   APPENDECTOMY  2019   BACK SURGERY     BREAST BIOPSY Right 12/2016   LAPAROSCOPIC APPENDECTOMY N/A 10/28/2017   Procedure: APPENDECTOMY LAPAROSCOPIC;  Surgeon: Leighton Ruff, MD;  Location: [facility];  Service: General;  Laterality: N/A;   TUBAL LIGATION     Patient Active Problem List   Diagnosis Date Noted   Lumbar adjacent segment disease with spondylolisthesis 02/26/2022   S/P lumbar spinal fusion 06/28/2021   Spondylolisthesis of lumbar region 06/28/2021   Constipation 02/21/2021   Diabetes mellitus ([place]) 11/26/2020   Chronic pain syndrome 11/26/2020   Appendicitis 10/28/2017   Cigarette smoker 07/26/2015   Dyspnea 05/29/2015   BACK PAIN, LUMBAR 05/29/2008   SARCOIDOSIS 12/21/2007   FATIGUE, CHRONIC 12/21/2007   RECTAL BLEEDING 12/14/2007   NECK PAIN, ACUTE 12/14/2007   ABDOMINAL PAIN, GENERALIZED 12/14/2007   DEPRESSION 05/13/2007   Hypertension  associated with type 2 diabetes mellitus ([place]) 05/13/2007   PERIODONTAL DISEASE 05/13/2007   HEMATURIA 05/13/2007   ALLERGY 05/13/2007   Class 3 severe obesity with serious comorbidity and body mass index (BMI) of 40.0 to 44.9 in adult ([place]) 05/07/2007    PCP: Cletis Athens NP  REFERRING PROVIDER: Dr. Christella Noa   REFERRING DIAG: S/P Fusion, Spondylolisthesis  Rationale for Evaluation and Treatment: Rehabilitation  THERAPY DIAG:  Other low back pain  Muscle weakness (generalized)  Radiculopathy, thoracolumbar region  ONSET DATE: 02/26/2022  SUBJECTIVE:  SUBJECTIVE STATEMENT:  My knees are killing me.  My back is feeling OK.  Ready for DC.    PERTINENT HISTORY:  Pre-diabetic, 06/28/2021: L4-L5 , L5-S1  Posterior lumbar interbody fusion  02/26/22:  L3-L4 PLIF See above   PAIN:  Are you having pain? Yes: NPRS scale: 3/10.  NONE AT REST.   Pain location: thoracolumbar spine and into posterior thighs  Pain description: vice, turning  Aggravating factors: standing  Relieving factors: sitting and laying down Ibuprofen, Tylenol, ice on back  PRECAUTIONS: None  WEIGHT BEARING RESTRICTIONS: No  FALLS:  Has patient fallen in last 6 months? No  LIVING ENVIRONMENT: Lives with: lives with their partner Lives in: House/apartment Stairs: No Has following equipment at home: None  OCCUPATION: Not working, was a patient transport Training and development officer)  PLOF: Independent  PATIENT GOALS: I want to feel better, get better and walk again. Lose wgt.   NEXT MD VISIT:   OBJECTIVE:   DIAGNOSTIC FINDINGS:  09/2022: CT IMPRESSION:   1. Interval pedicle screw and interbody fusion at L3-4. Interval   development of bilateral pars defects at L3. No significant stenosis   2. Solid fusion L4-5 and L5-S1. Mild to  moderate subarticular and   foraminal stenosis bilaterally L5-S1 due to spurring.    PATIENT SURVEYS:  FOTO 36% 10/31/22" 57% DC from [place] FLAGS: Bowel or bladder incontinence: No Spinal tumors: No Cauda equina syndrome: No Compression fracture: No Abdominal aneurysm: No  COGNITION: Overall cognitive status: Within functional limits for tasks assessed     SENSATION: Vague numbness in LEs, feet  MUSCLE LENGTH: NT    POSTURE: rounded shoulders, forward head, flexed trunk , and genu varus , knees flexed  PALPATION: L knee swollen> Rt.  Min tenderness in thoracolumbar spine Pain L L5-S1 and extending outward across superior glutes , Lt lateral hip and thigh   LUMBAR ROM:   AROM Eval 09/24/22 11/03/22 11/19/22  Flexion 80% limited pain Reaches just below knees, low back pain   No pain , WFL   Extension 80% limited pain  75% limited min pain  50% felt good!  Right lateral flexion 50% pain R side   Better than to the L , still 25% or more limited  25% no pain   Left lateral flexion 50% pain L side  50% no pain  Pain, 25% limited  L mid back   Right rotation 25% min pain No pain WNL  Min stiffness and range but WFL  Left rotation 25% min pain  No pain  WNL  As above    (Blank rows = not tested)  LOWER EXTREMITY ROM:     Active  Right eval Left eval  Hip flexion    Hip extension    Hip abduction    Hip adduction    Hip internal rotation    Hip external rotation    Knee flexion 115 120+  Knee extension -10 -10  Ankle dorsiflexion    Ankle plantarflexion    Ankle inversion    Ankle eversion     (Blank rows = not tested)  LOWER EXTREMITY MMT:    MMT Right eval Left eval Rt./Lt.  11/19/22  Hip flexion 3-/5 3-/5 4/5  Hip extension NT NT    Hip abduction 3/5 3/5 4/5, 4-/5  Hip adduction     Hip internal rotation     Hip external rotation     Knee flexion 5/5 5/5 5/5  Knee extension 5/5 5/5  5/5  Ankle dorsiflexion 5/5 5/5 5/5  Ankle  plantarflexion     Ankle inversion     Ankle eversion      (Blank rows = not tested)  LUMBAR SPECIAL TESTS:  Straight leg raise test: Positive Lt.   FUNCTIONAL TESTS:  5 times sit to stand: unable to complete > 2 reps due to pain in L back and bilateral knees, 2 reps about 30 sec with hands  09/29/22: 2 min walk test: 398 no rest break , o2 sats 97% and HR 125, knee pain limited her- 8/10 pain   11/19/22: 2 min walk test 514 feet no knee pain, min back pain    GAIT: Distance walked: 75 Assistive device utilized: None Level of assistance: Modified independence Comments: slow pace, antalgic gait    TODAY'S TREATMENT:       [facility] Adult PT Treatment:                                                DATE: 11/19/22 Therapeutic Exercise: Trunk ROM, strength testing Sit to stand x 5 , 48 sec KNEE PAIN  2 min walk test 514 feet no knee pain but min back pain  Supine PPT x 10  PPT with march x 10  Lower trunk rotation  Supine partial bridge with band (green) x 10  Hip abduction x 10  Self Care: HEP, DC   [facility] Adult PT Treatment:                                                DATE: 11/17/22 Therapeutic Exercise: Nu Step L 5 UE and LE for 8 min  Combo hip strengthening:  Hip abduction , extension and flexion 1 min each in standing with  1 UE assist  Deep supported squat  x 15 Unsupported squat  x 15  Toe taps 4 inch step downs glutes  x 15 Supine abdominal bracing  Added SLR with Pilates circle  Hamstring and ITB 60 sec x 2 each LE with strap Ball bridge x 10  LTR x 10    [facility] Adult PT Treatment:                                                DATE: 11/12/22 Therapeutic Exercise: NuStep L6 UE and LE  Ambulation 2 laps with 10 lbs KB (suitcase carry)  Standing partial squat at sink x 15 Airex pad for balance: head turns, upper trunk rotation Side facing march with and without KB Standing core press out with 10 KB  x 10 on airex Posterior pelvic tilt  Supine LTR with physio ball   Partial bridge with physioball x 10 Hamstring curl with ball  Knee to chest with sheet x 45 sec , crossed foot onto opp leg (outer hip stretch) x 45 sec   [facility] Adult PT Treatment:  DATE: 11/10/22 Therapeutic Exercise: Nustep L5 UE and LE for 7 min  Sidelying upper trunk x 8  Clam x 15 Hip abduction x 10  Bridging  x 10 Lower trunk rotation x 10  Supine lower abs : Shoulder extension x 10 blue band added knee lift, added bridge (painful) Standing 10 lbs KB march x 15 Standing wall squat x with black physio ball small ROM x 15 Hip extension belly on ball on high mat table x 10 added opposite arm reach for core challenge x 5     [facility] Adult PT Treatment:                                                DATE: 11/05/22 Therapeutic Exercise: Treadmill 1.5 mph for 8 min  Supine core :breathing/Pilates ring squeeze x 10  Supine march with ring x 10  Lower trunk rotation x 10 reset  Bridge in parallel x 15 , 3 sec hold  SLR  x 10  Sidelying x 10  Tandem on Airex for core, head turns  Narrow stance with single arm lateral and forward raise, 5 lbs    [facility] Adult PT Treatment:                                                DATE: 11/03/22 Therapeutic Exercise: Nustep 6 min L 5 UE and LE  Treadmill up to 1.5 mph and 2 % grade for 6 min  Leg press 1 plate double leg x 20 2 sets, added slight turnout  Knee extension 20 lbs x 2  Knee flexion 25 lbs x 15  Row 25 lbs x 10  Lat pull down x 25  x 10  Trunk motion standing and then seated rotation and sidebending  Self Care: Principles of progressive overload and gym exercises  Recommended machines for beginner and improved safety.   POC    [facility] Adult PT Treatment:                                                DATE: 10/31/22 Therapeutic Exercise: Nustep L5 UE and LE for 6 min  Calf stretch for lower leg 2 x 30 sec on wall Bilateral shoulder extension with ball squeeze x 10 blue band  Lower trunk  rotation Physio ball hamstring curls Physio ball bridge  Core press with ball opposite arm on opp thigh x 5  Added SLR Rt LE painful Standing hip abduction /ext. X 10  Hip flexor stretch 30 sec x 2  FOTO done     PATIENT EDUCATION:  Education details: PT, POC, HEP, core/breathing, pool , sit to stand  Person educated: Patient Education method: Consulting civil engineer, Demonstration, Verbal cues, and Handouts Education comprehension: verbalized understanding and needs further education  HOME EXERCISE PROGRAM: Access Code: [email or web address] URL: [email or web address] Date: 09/24/2022 Prepared by: Raeford Razor  ASSESSMENT:  CLINICAL IMPRESSION: Patient    OBJECTIVE IMPAIRMENTS: cardiopulmonary status limiting activity, decreased activity tolerance, decreased endurance, decreased knowledge of use of DME, decreased mobility, difficulty walking, decreased ROM, decreased strength, increased edema, increased fascial restrictions, impaired flexibility,  impaired sensation, improper body mechanics, postural dysfunction, obesity, and pain.   ACTIVITY LIMITATIONS: carrying, lifting, bending, sitting, standing, squatting, sleeping, stairs, transfers, bed mobility, locomotion level, and caring for others  PARTICIPATION LIMITATIONS: meal prep, cleaning, laundry, interpersonal relationship, driving, shopping, and community activity  PERSONAL FACTORS: Time since onset of injury/illness/exacerbation and 3+ comorbidities: previous back surgery, obesity, diabetes   are also affecting patient's functional outcome.   REHAB POTENTIAL: Good  CLINICAL DECISION MAKING: Evolving/moderate complexity  EVALUATION COMPLEXITY: Moderate   GOALS: Goals reviewed with patient? Yes  SHORT TERM GOALS: Target date: 10/22/2022    Pt will be I with HEP for core, posture and LE strength/mobility  Baseline: Goal status: MET   2.  Pt will be able to complete functional assessment and set goal (sit to stand, gait)   Baseline: unable to complete due to knee pain  Goal status: MET   3.  Pt will be able to perform bed mobility and chair/mat transfers with no increase in pain  Baseline:  Goal status: MET   4.  Pt will be able to stand for 10 min for light housework with pain <6/10 Baseline: 8/10+, pain in back minimal recently  Goal status: MET   LONG TERM GOALS: Target date: 11/19/2022    Pt will be able to improve FOTO score to 47% or greater to demo improved functional mobility.  Baseline: 36%. DC 57%  Goal status:MET   2.  Pt will be able to improve hip strength to 4+/5 or better for improved back support with transfers and gait  Baseline: lateral hip 4-/5 to 4+/5 Goal status: partially met   3.  Pt will be able to walk in the grocery store for 30 min with min increase in pain from baseline, using cart for support  Baseline: mod to severe , 3/20 can do this  Goal status:MET   4.  Pt will be able to perform proper hip hinge to improve safety with light home tasks (vs bending) and improved spine stability  Baseline:  Goal status MET   5.  Pt will be I with HEP upon discharge  Baseline:  Goal status: MET   6.  Pt will be able to walk 450 feet in 2 min walk test with knee pain no more than 6/10, no increase in back pain .  Baseline: 398, knee 8/10 Goal status: MET   PLAN:  PT FREQUENCY: 2x/week  PT DURATION: 8 weeks  PLANNED INTERVENTIONS: Therapeutic exercises, Therapeutic activity, Neuromuscular re-education, Balance training, Gait training, Patient/Family education, Self Care, Aquatic Therapy, Dry Needling, Electrical stimulation, Cryotherapy, Moist heat, Manual lymph drainage, Manual therapy, and Re-evaluation.  PLAN FOR NEXT SESSION : goals, finish POC , discharge   Raeford Razor, PT 11/19/22 2:20 PM Phone: [phone] Fax: [phone]   PHYSICAL THERAPY DISCHARGE SUMMARY  Visits from Start of Care: 15  Current functional level related to goals / functional  outcomes: See above    Remaining deficits: None limiting functional mobility.  Knee pain.  Endurance    Education / Equipment: HEP, lifting, neutral spine    Patient agrees to discharge. Patient goals were met. Patient is being discharged due to being pleased with the current functional level.  Raeford Razor, PT 11/19/22 2:59 PM Phone: [phone] Fax: [phone]

## 2022-11-19 ENCOUNTER — Ambulatory Visit: Payer: 59 | Admitting: Physical Therapy

## 2022-11-19 ENCOUNTER — Encounter: Payer: Self-pay | Admitting: Physical Therapy

## 2022-11-19 DIAGNOSIS — M5415 Radiculopathy, thoracolumbar region: Secondary | ICD-10-CM

## 2022-11-19 DIAGNOSIS — M6281 Muscle weakness (generalized): Secondary | ICD-10-CM

## 2022-11-19 DIAGNOSIS — M5459 Other low back pain: Secondary | ICD-10-CM

## 2022-12-30 ENCOUNTER — Ambulatory Visit
Admission: RE | Admit: 2022-12-30 | Discharge: 2022-12-30 | Disposition: A | Discharge status: Home / Self Care | Payer: 59 | Source: Ambulatory Visit

## 2022-12-30 DIAGNOSIS — Z122 Encounter for screening for malignant neoplasm of respiratory organs: Secondary | ICD-10-CM

## 2022-12-30 DIAGNOSIS — Z87891 Personal history of nicotine dependence: Secondary | ICD-10-CM

## 2022-12-30 DIAGNOSIS — F17210 Nicotine dependence, cigarettes, uncomplicated: Secondary | ICD-10-CM

## 2023-01-02 ENCOUNTER — Other Ambulatory Visit: Payer: Self-pay

## 2023-01-02 DIAGNOSIS — Z122 Encounter for screening for malignant neoplasm of respiratory organs: Secondary | ICD-10-CM

## 2023-01-02 DIAGNOSIS — Z87891 Personal history of nicotine dependence: Secondary | ICD-10-CM

## 2023-01-02 DIAGNOSIS — F17210 Nicotine dependence, cigarettes, uncomplicated: Secondary | ICD-10-CM

## 2023-01-08 ENCOUNTER — Other Ambulatory Visit: Payer: Self-pay

## 2023-01-08 ENCOUNTER — Emergency Department (HOSPITAL_COMMUNITY)
Admission: EM | Admit: 2023-01-08 | Discharge: 2023-01-08 | Disposition: A | Discharge status: Home / Self Care | Payer: 59 | Attending: Emergency Medicine | Admitting: Emergency Medicine

## 2023-01-08 ENCOUNTER — Emergency Department (HOSPITAL_COMMUNITY): Payer: 59

## 2023-01-08 DIAGNOSIS — Z7982 Long term (current) use of aspirin: Secondary | ICD-10-CM | POA: Insufficient documentation

## 2023-01-08 DIAGNOSIS — K2900 Acute gastritis without bleeding: Secondary | ICD-10-CM | POA: Diagnosis not present

## 2023-01-08 DIAGNOSIS — E876 Hypokalemia: Secondary | ICD-10-CM

## 2023-01-08 DIAGNOSIS — R110 Nausea: Secondary | ICD-10-CM

## 2023-01-08 DIAGNOSIS — Z79899 Other long term (current) drug therapy: Secondary | ICD-10-CM | POA: Diagnosis not present

## 2023-01-08 DIAGNOSIS — R109 Unspecified abdominal pain: Secondary | ICD-10-CM | POA: Diagnosis present

## 2023-01-08 DIAGNOSIS — I10 Essential (primary) hypertension: Secondary | ICD-10-CM | POA: Diagnosis not present

## 2023-01-08 LAB — LIPASE, BLOOD: Lipase: 32 U/L (ref 11–51)

## 2023-01-08 LAB — CBC WITH DIFFERENTIAL/PLATELET
Abs Immature Granulocytes: 0.02 10*3/uL (ref 0.00–0.07)
Basophils Absolute: 0 10*3/uL (ref 0.0–0.1)
Basophils Relative: 1 %
Eosinophils Absolute: 0.1 10*3/uL (ref 0.0–0.5)
Eosinophils Relative: 2 %
HCT: 42.7 % (ref 36.0–46.0)
Hemoglobin: 13.4 g/dL (ref 12.0–15.0)
Immature Granulocytes: 0 %
Lymphocytes Relative: 21 %
Lymphs Abs: 1.8 10*3/uL (ref 0.7–4.0)
MCH: 26.7 pg (ref 26.0–34.0)
MCHC: 31.4 g/dL (ref 30.0–36.0)
MCV: 85.1 fL (ref 80.0–100.0)
Monocytes Absolute: 0.4 10*3/uL (ref 0.1–1.0)
Monocytes Relative: 5 %
Neutro Abs: 5.9 10*3/uL (ref 1.7–7.7)
Neutrophils Relative %: 71 %
Platelets: 304 10*3/uL (ref 150–400)
RBC: 5.02 MIL/uL (ref 3.87–5.11)
RDW: 14.9 % (ref 11.5–15.5)
WBC: 8.3 10*3/uL (ref 4.0–10.5)
nRBC: 0 % (ref 0.0–0.2)

## 2023-01-08 LAB — COMPREHENSIVE METABOLIC PANEL
ALT: 10 U/L (ref 0–44)
AST: 14 U/L — ABNORMAL LOW (ref 15–41)
Albumin: 2.8 g/dL — ABNORMAL LOW (ref 3.5–5.0)
Alkaline Phosphatase: 53 U/L (ref 38–126)
Anion gap: 8 (ref 5–15)
BUN: 8 mg/dL (ref 8–23)
CO2: 18 mmol/L — ABNORMAL LOW (ref 22–32)
Calcium: 6.6 mg/dL — ABNORMAL LOW (ref 8.9–10.3)
Chloride: 116 mmol/L — ABNORMAL HIGH (ref 98–111)
Creatinine, Ser: 0.58 mg/dL (ref 0.44–1.00)
GFR, Estimated: >60 mL/min (ref >60)
Glucose, Bld: 148 mg/dL — ABNORMAL HIGH (ref 70–99)
Potassium: 2.1 mmol/L — CL (ref 3.5–5.1)
Sodium: 142 mmol/L (ref 135–145)
Total Bilirubin: 0.2 mg/dL — ABNORMAL LOW (ref 0.3–1.2)
Total Protein: 5.6 g/dL — ABNORMAL LOW (ref 6.5–8.1)

## 2023-01-08 MED ORDER — LACTATED RINGERS IV BOLUS
1000.0000 mL | Freq: Once | INTRAVENOUS | Status: AC
  Administered 2023-01-08: 1000 mL via INTRAVENOUS

## 2023-01-08 MED ORDER — POTASSIUM CHLORIDE 10 MEQ/100ML IV SOLN
10.0000 meq | INTRAVENOUS | Status: AC
  Administered 2023-01-08 (×2): 10 meq via INTRAVENOUS
  Filled 2023-01-08 (×2): qty 100

## 2023-01-08 MED ORDER — SODIUM CHLORIDE (PF) 0.9 % IJ SOLN
INTRAMUSCULAR | Status: AC
  Filled 2023-01-08: qty 50

## 2023-01-08 MED ORDER — IOHEXOL 300 MG/ML  SOLN
100.0000 mL | Freq: Once | INTRAMUSCULAR | Status: AC | PRN
  Administered 2023-01-08: 100 mL via INTRAVENOUS

## 2023-01-08 MED ORDER — METOCLOPRAMIDE HCL 10 MG PO TABS: 10.0000 mg | ORAL_TABLET | Freq: Four times a day (QID) | ORAL | 0 refills | Status: AC | PRN

## 2023-01-08 MED ORDER — ALUM & MAG HYDROXIDE-SIMETH 200-200-20 MG/5ML PO SUSP
30.0000 mL | Freq: Once | ORAL | Status: AC
  Administered 2023-01-08: 30 mL via ORAL
  Filled 2023-01-08: qty 30

## 2023-01-08 MED ORDER — FAMOTIDINE IN NACL 20-0.9 MG/50ML-% IV SOLN
20.0000 mg | Freq: Once | INTRAVENOUS | Status: AC
  Administered 2023-01-08: 20 mg via INTRAVENOUS
  Filled 2023-01-08: qty 50

## 2023-01-08 MED ORDER — PANTOPRAZOLE SODIUM 20 MG PO TBEC: 20.0000 mg | DELAYED_RELEASE_TABLET | Freq: Every day | ORAL | 0 refills | Status: AC

## 2023-01-08 MED ORDER — METOCLOPRAMIDE HCL 5 MG/ML IJ SOLN
10.0000 mg | Freq: Once | INTRAMUSCULAR | Status: AC
  Administered 2023-01-08: 10 mg via INTRAVENOUS
  Filled 2023-01-08: qty 2

## 2023-01-08 NOTE — ED Provider Notes (Signed)
[facility]ERGENC[facility] DEPARTMENT AT [facility] Provider Note   CSN: [id number] Arrival date & time: 01/08/23  1210     History  Chief Complaint  Patient presents with   Abdominal Pain    Shannon [name] is a 67 y.o. female.  Patient is a 67 year old female with a history of hypertension, prediabetes, constipation, sarcoidosis who is presenting with complaints of abdominal pain.  Patient reports since December she has been struggling with a lot of constipation and even before that but it became more prominent in December.  She followed up with her PCP and they started her on lactulose.  Prior to that she was only having bowel movements every 2 weeks.  Initially the lactulose was helpful but then started to cause severe cramping and 2 weeks ago she took a dose and the cramping was so bad she has not taken anymore.  Unfortunately for the last 10 days she has had significant nausea, abdominal distention, waves of pain.  Last bowel movement was today it was hard but she was able to have a bowel movement is not affected her nausea or pain.  She has had no vomiting or urinary changes.  No fevers.  Status post appendectomy and abdominal hysterectomy but no other abdominal surgeries.  The history is provided by the patient.  Abdominal Pain      Home Medications Prior to Admission medications   Medication Sig Start Date End Date Taking? Authorizing Provider  metoCLOPramide (REGLAN) 10 MG tablet Take 1 tablet (10 mg total) by mouth every 6 (six) hours as needed for nausea. 01/08/23  Yes Kacey Vicuna, Alphonzo Lemmings, MD  pantoprazole (PROTONIX) 20 MG tablet Take 1 tablet (20 mg total) by mouth daily. 01/08/23  Yes Gwyneth Sprout, MD  acetaminophen (TYLENOL) 650 MG CR tablet Take 1,300 mg by mouth every 8 (eight) hours as needed for pain.    [provider]  Alpha-D-Galactosidase Charlyne Quale) TABS Take 1 tablet by mouth daily.    [provider]  amLODipine (NORVASC) 10 MG tablet Take  10 mg by mouth daily.    [provider]  amLODipine (NORVASC) 5 MG tablet Take 5 mg by mouth daily.    [provider]  aspirin EC 81 MG tablet Take 81 mg by mouth daily.    [provider]  Aspirin-Salicylamide-Caffeine (ARTHRITIS STRENGTH BC POWDER PO) Take 1 packet by mouth daily as needed (pain).    [provider]  Cholecalciferol (VITAMIN D) 125 MCG (5000 UT) CAPS Take 5,000 Units by mouth in the morning.    [provider]  diclofenac Sodium (ALEVE ARTHRITIS PAIN) 1 % GEL Apply 1 Application topically 4 (four) times daily as needed (pain).    [provider]  DULoxetine (CYMBALTA) 20 MG capsule Take 20 mg by mouth 2 (two) times daily. 09/21/17   [provider]  Ferrous Sulfate (SLOW FE) 142 (45 Fe) MG TBCR Take 1 tablet by mouth daily.    [provider]  fluticasone (FLONASE) 50 MCG/ACT nasal spray Place 1 spray into both nostrils daily as needed for allergies or rhinitis.    [provider]  gabapentin (NEURONTIN) 300 MG capsule Take 300 mg by mouth at bedtime.    [provider]  hydrALAZINE (APRESOLINE) 50 MG tablet Take 50 mg by mouth 2 (two) times daily.    [provider]  hydrochlorothiazide (MICROZIDE) 12.5 MG capsule Take 12.5 mg by mouth in the morning.    [provider]  lactulose (  CHRONULAC) 10 GM/15ML solution Take 20 g by mouth daily as needed for mild constipation.    [provider]  losartan (COZAAR) 100 MG tablet Take 100 mg by mouth every morning. 10/22/20   [provider]  Menthol-Camphor (TIGER BALM ARTHRITIS RUB EX) Apply 1 Application topically daily as needed (pain).    [provider]  pantoprazole (PROTONIX) 20 MG tablet Take 1 tablet (20 mg total) by mouth daily. 11/18/16   Arby Barrette, MD  potassium chloride SA (KLOR-CON) 20 MEQ tablet Take 20 mEq by mouth every evening. 10/22/20   [provider]  Probiotic Product  (PROBIOTIC PO) Take 1 capsule by mouth daily.    [provider]  rOPINIRole (REQUIP) 0.5 MG tablet Take 0.5 mg by mouth at bedtime. 06/03/15   [provider]  rosuvastatin (CRESTOR) 10 MG tablet Take 10 mg by mouth daily. 05/02/22   [provider]  Semaglutide,0.25 or 0.5MG /DOS, (OZEMPIC, 0.25 OR 0.5 MG/DOSE,) 2 MG/1.5ML SOPN Inject 0.5 mg into the skin every Sunday.    [provider]  tiZANidine (ZANAFLEX) 4 MG tablet Take 4 mg by mouth at bedtime. Patient not taking: Reported on 09/24/2022 07/08/22   [provider]      Allergies    Oxycodone, Propoxyphene n-acetaminophen, and Prednisone    Review of Systems   Review of Systems  Gastrointestinal:  Positive for abdominal pain.    Physical Exam Updated Vital Signs BP (!) 150/95   Pulse 72   Temp 98.4 F (36.9 C) (Oral)   Resp 18   Ht 5\' 6"  (1.676 m)   Wt 124 kg   SpO2 97%   BMI 44.12 kg/m  Physical Exam Vitals and nursing note reviewed.  Constitutional:      General: She is not in acute distress.    Appearance: She is well-developed.  HENT:     Head: Normocephalic and atraumatic.  Eyes:     Pupils: Pupils are equal, round, and reactive to light.  Cardiovascular:     Rate and Rhythm: Normal rate and regular rhythm.     Heart sounds: Normal heart sounds. No murmur heard.    No friction rub.  Pulmonary:     Effort: Pulmonary effort is normal.     Breath sounds: Normal breath sounds. No wheezing or rales.  Abdominal:     General: Bowel sounds are normal. There is distension.     Palpations: Abdomen is soft.     Tenderness: There is generalized abdominal tenderness. There is guarding. There is no rebound.  Musculoskeletal:        General: No tenderness. Normal range of motion.     Comments: No edema  Skin:    General: Skin is warm and dry.     Findings: No rash.  Neurological:     Mental Status: She is alert and oriented to person, place, and time. Mental status is at  baseline.     Cranial Nerves: No cranial nerve deficit.  Psychiatric:        Mood and Affect: Mood normal.        Behavior: Behavior normal.     ED Results / Procedures / Treatments   Labs (all labs ordered are listed, but only abnormal results are displayed) Labs Reviewed  COMPREHENSIVE METABOLIC PANEL - Abnormal; Notable for the following components:      Result Value   Potassium 2.1 (*)    Chloride 116 (*)    CO2 18 (*)  Glucose, Bld 148 (*)    Calcium 6.6 (*)    Total Protein 5.6 (*)    Albumin 2.8 (*)    AST 14 (*)    Total Bilirubin 0.2 (*)    All other components within normal limits  CBC WITH DIFFERENTIAL/PLATELET  LIPASE, BLOOD  URINALYSIS, ROUTINE W REFLEX MICROSCOPIC    EKG None  Radiology CT ABDOMEN PELVIS W CONTRAST  Result Date: 01/08/2023 CLINICAL DATA:  Constipation, nausea, headaches and abdominal pain. EXAM: CT ABDOMEN AND PELVIS WITH CONTRAST TECHNIQUE: Multidetector CT imaging of the abdomen and pelvis was performed using the standard protocol following bolus administration of intravenous contrast. RADIATION DOSE REDUCTION: This exam was performed according to the departmental dose-optimization program which includes automated exposure control, adjustment of the mA and/or kV according to patient size and/or use of iterative reconstruction technique. CONTRAST:  OMNIPAQUE IOHEXOL 300 MG/ML  SOLN COMPARISON:  Noncontrast CT 05/02/2022 FINDINGS: Lower chest: Mild linear opacity lung bases likely scar or atelectasis. No pleural effusion. 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 Hepatobiliary: No focal liver abnormality is seen. No gallstones, gallbladder wall thickening, or biliary dilatation. Pancreas: Unremarkable. No pancreatic ductal dilatation or surrounding inflammatory changes. Spleen: Normal in size without focal abnormality. Adrenals/Urinary Tract: No enhancing renal mass or collecting system dilatation.  The ureters have normal course and caliber down to the bladder. There are some tiny low-attenuation lesion seen in each kidney which are under a cm and too small to completely characterize but likely benign cysts. Bosniak 2 lesions. No specific imaging follow-up. There are bilateral adrenal nodules which are unchanged from previous and on the prior noncontrast study were felt to be adenomas. These are unchanged. No additional follow-up. Stomach/Bowel: On this non oral contrast exam, large bowel has a normal course and caliber with scattered stool. Surgical changes in the right lower quadrant. The appendix is not well seen. Please correlate with the history. Stomach and small bowel are nondilated. There are some small bowel stool appearance of the distal ileum, nonspecific. Vascular/Lymphatic: Aortic atherosclerosis. No enlarged abdominal or pelvic lymph nodes. Reproductive: Status post hysterectomy. No adnexal masses. Other: Small fat containing umbilical hernia. No free air or free fluid. Musculoskeletal: Significant streak artifact related to the patient's spinal fixation hardware along the lower lumbar spine with laminectomy. Scattered degenerative changes elsewhere. IMPRESSION: No bowel obstruction, free air or free fluid. Scattered stool. The appendix is not seen but there are surgical changes in the right lower quadrant. 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 Electronically Signed   By: Karen Kays M.D.   On: 01/08/2023 14:30    Procedures Procedures    Medications Ordered in ED Medications  potassium chloride 10 mEq in 100 mL IVPB (10 mEq Intravenous New Bag/Given 01/08/23 1522)  famotidine (PEPCID) IVPB 20 mg premix (has no administration in time range)  alum & mag hydroxide-simeth (MAALOX/MYLANTA) 200-200-20 MG/5ML suspension 30 mL (has no administration in time range)  metoCLOPramide (REGLAN) injection 10 mg (10 mg Intravenous Given 01/08/23 1258)  lactated ringers bolus 1,000  mL (0 mLs Intravenous Stopped 01/08/23 1423)  iohexol (OMNIPAQUE) 300 MG/ML solution 100 mL (100 mLs Intravenous Contrast Given 01/08/23 1357)    ED Course/ Medical Decision Making/ A&P                             Medical Decision Making Amount and/or Complexity of Data Reviewed External Data Reviewed: notes.  Labs: ordered. Decision-making details documented in ED Course. Radiology: ordered and independent interpretation performed. Decision-making details documented in ED Course.  Risk OTC drugs. Prescription drug management.   Pt with multiple medical problems and comorbidities and presenting today with a complaint that caries a high risk for morbidity and mortality.  Here today with worsening abdominal pain, bloating and nausea.  These episodes have been intermittent since December she was taking lactulose for constipation but the nausea and discomfort has now been present constantly for the last 10 days.  She denies any infectious symptoms but on exam has diffuse abdominal tenderness.  Concern for diverticulitis, obstruction, cholecystitis, pancreatitis, mass, peptic ulcer disease.  Low suspicion for cardiac or respiratory cause of her symptoms.  Lower suspicion for urinary cause.  Patient given antiemetics, labs and imaging are pending.  3:35 PM I independently interpreted patient's labs and CBC today within normal limits, CMP with hypokalemia with potassium of 2.1 and low calcium as well.  Patient's renal function is normal, LFTs and total bilirubin are normal and has a normal anion gap, lipase within normal limits.  I have independently visualized and interpreted pt's images today.  CT today without evidence of mass, obstruction, diverticulitis or cholecystitis.  Radiology reports no acute findings at this time.  Findings were discussed with the patient.  Potassium was replaced she does take oral potassium at home and is on hydrochlorothiazide and losartan which is most likely the cause of  her hypokalemia as well as decreased oral intake due to the nausea.  She does take 20 mg of pantoprazole daily but concern for possible ulcers based on her symptoms.  Patient is feeling better after Reglan will give prescription to use at home as needed but will also increase her pantoprazole to 40 mg daily for the next 2 weeks and encouraged her to follow-up with gastroenterology for further evaluation and possible endoscopy in the future if her symptoms continue.  She also is not interested in taking more potassium as the pills are not very palatable she was given a sheet with food she can eat to increase her potassium.  At this time there is no indication for further testing patient does not require admission and is stable for discharge home.  She is comfortable with this plan and there are no social barriers affecting her discharge today.         Final Clinical Impression(s) / ED Diagnoses Final diagnoses:  Acute gastritis without hemorrhage, unspecified gastritis type  Nausea    Rx / DC Orders ED Discharge Orders          Ordered    pantoprazole (PROTONIX) 20 MG tablet  Daily        01/08/23 1535    metoCLOPramide (REGLAN) 10 MG tablet  Every 6 hours PRN        01/08/23 1535              Gwyneth Sprout, MD 01/08/23 1535

## 2023-01-08 NOTE — ED Triage Notes (Signed)
Pt reports constipation, nausea and headaches. States had bm this morning but had to strain. Diffuse abd pain and "feeling bloated" for months

## 2023-01-08 NOTE — Discharge Instructions (Addendum)
I want you to start taking 2 with the pantoprazole so a total of 40 mg daily for the next 2 weeks, avoid any aspirin products or ibuprofen products.  Your CAT scan today looked good and your blood work other than having low potassium was reassuring.  Continue to take your potassium supplement but also increase foods with potassium to help your levels.  Your level will need to be rechecked by your doctor next week.  You are also given a prescription for some nausea medicine that you can use as needed.  Try to eat small frequent meals and avoid eating spicy foods, acidic foods or eating right before you go to bed.  You have seen [facility] gastroenterology in the past and you need to follow-up with them for them to see if you need to have any more testing or scope done to look for ulcers in your stomach.  If you start having vomiting, fever worsening pain return to the emergency room.

## 2023-02-03 ENCOUNTER — Encounter (HOSPITAL_COMMUNITY): Payer: Self-pay

## 2023-02-03 ENCOUNTER — Emergency Department (HOSPITAL_COMMUNITY)
Admission: EM | Admit: 2023-02-03 | Discharge: 2023-02-03 | Disposition: A | Discharge status: Home / Self Care | Payer: 59 | Attending: Student | Admitting: Student

## 2023-02-03 ENCOUNTER — Emergency Department (HOSPITAL_COMMUNITY): Payer: 59

## 2023-02-03 ENCOUNTER — Other Ambulatory Visit: Payer: Self-pay

## 2023-02-03 DIAGNOSIS — Z87891 Personal history of nicotine dependence: Secondary | ICD-10-CM | POA: Diagnosis not present

## 2023-02-03 DIAGNOSIS — R109 Unspecified abdominal pain: Secondary | ICD-10-CM | POA: Insufficient documentation

## 2023-02-03 DIAGNOSIS — Z7982 Long term (current) use of aspirin: Secondary | ICD-10-CM | POA: Diagnosis not present

## 2023-02-03 DIAGNOSIS — Z7984 Long term (current) use of oral hypoglycemic drugs: Secondary | ICD-10-CM | POA: Insufficient documentation

## 2023-02-03 DIAGNOSIS — K111 Hypertrophy of salivary gland: Secondary | ICD-10-CM | POA: Diagnosis not present

## 2023-02-03 DIAGNOSIS — E119 Type 2 diabetes mellitus without complications: Secondary | ICD-10-CM | POA: Diagnosis not present

## 2023-02-03 DIAGNOSIS — I10 Essential (primary) hypertension: Secondary | ICD-10-CM | POA: Insufficient documentation

## 2023-02-03 DIAGNOSIS — R600 Localized edema: Secondary | ICD-10-CM

## 2023-02-03 DIAGNOSIS — Z79899 Other long term (current) drug therapy: Secondary | ICD-10-CM | POA: Diagnosis not present

## 2023-02-03 LAB — URINALYSIS, ROUTINE W REFLEX MICROSCOPIC
Bilirubin Urine: NEGATIVE
Glucose, UA: NEGATIVE mg/dL
Hgb urine dipstick: NEGATIVE
Ketones, ur: NEGATIVE mg/dL
Leukocytes,Ua: NEGATIVE
Nitrite: NEGATIVE
Protein, ur: NEGATIVE mg/dL
Specific Gravity, Urine: 1.014 (ref 1.005–1.030)
pH: 5 (ref 5.0–8.0)

## 2023-02-03 LAB — CBC WITH DIFFERENTIAL/PLATELET
Abs Immature Granulocytes: 0.05 10*3/uL (ref 0.00–0.07)
Basophils Absolute: 0 10*3/uL (ref 0.0–0.1)
Basophils Relative: 0 %
Eosinophils Absolute: 0.2 10*3/uL (ref 0.0–0.5)
Eosinophils Relative: 1 %
HCT: 38.4 % (ref 36.0–46.0)
Hemoglobin: 12.3 g/dL (ref 12.0–15.0)
Immature Granulocytes: 0 %
Lymphocytes Relative: 13 %
Lymphs Abs: 1.8 10*3/uL (ref 0.7–4.0)
MCH: 27 pg (ref 26.0–34.0)
MCHC: 32 g/dL (ref 30.0–36.0)
MCV: 84.2 fL (ref 80.0–100.0)
Monocytes Absolute: 0.6 10*3/uL (ref 0.1–1.0)
Monocytes Relative: 4 %
Neutro Abs: 11.3 10*3/uL — ABNORMAL HIGH (ref 1.7–7.7)
Neutrophils Relative %: 82 %
Platelets: 290 10*3/uL (ref 150–400)
RBC: 4.56 MIL/uL (ref 3.87–5.11)
RDW: 14.3 % (ref 11.5–15.5)
WBC: 13.9 10*3/uL — ABNORMAL HIGH (ref 4.0–10.5)
nRBC: 0 % (ref 0.0–0.2)

## 2023-02-03 LAB — COMPREHENSIVE METABOLIC PANEL
ALT: 20 U/L (ref 0–44)
AST: 20 U/L (ref 15–41)
Albumin: 3.8 g/dL (ref 3.5–5.0)
Alkaline Phosphatase: 78 U/L (ref 38–126)
Anion gap: 10 (ref 5–15)
BUN: 16 mg/dL (ref 8–23)
CO2: 22 mmol/L (ref 22–32)
Calcium: 9.1 mg/dL (ref 8.9–10.3)
Chloride: 107 mmol/L (ref 98–111)
Creatinine, Ser: 0.71 mg/dL (ref 0.44–1.00)
GFR, Estimated: >60 mL/min (ref >60)
Glucose, Bld: 145 mg/dL — ABNORMAL HIGH (ref 70–99)
Potassium: 3.2 mmol/L — ABNORMAL LOW (ref 3.5–5.1)
Sodium: 139 mmol/L (ref 135–145)
Total Bilirubin: 0.3 mg/dL (ref 0.3–1.2)
Total Protein: 7.7 g/dL (ref 6.5–8.1)

## 2023-02-03 LAB — LIPASE, BLOOD: Lipase: 41 U/L (ref 11–51)

## 2023-02-03 LAB — TSH: TSH: 0.547 u[IU]/mL (ref 0.350–4.500)

## 2023-02-03 MED ORDER — LIDOCAINE 5 % EX PTCH
1.0000 | MEDICATED_PATCH | CUTANEOUS | Status: DC
  Administered 2023-02-03: 1 via TRANSDERMAL
  Filled 2023-02-03: qty 1

## 2023-02-03 MED ORDER — LIDOCAINE VISCOUS HCL 2 % MT SOLN
15.0000 mL | Freq: Once | OROMUCOSAL | Status: AC
  Administered 2023-02-03: 15 mL via OROMUCOSAL
  Filled 2023-02-03: qty 15

## 2023-02-03 MED ORDER — AMOXICILLIN-POT CLAVULANATE 875-125 MG PO TABS: 1.0000 | ORAL_TABLET | Freq: Two times a day (BID) | ORAL | 0 refills | Status: AC

## 2023-02-03 MED ORDER — NAPROXEN 375 MG PO TABS: 375.0000 mg | ORAL_TABLET | Freq: Two times a day (BID) | ORAL | 0 refills | Status: AC

## 2023-02-03 MED ORDER — KETOROLAC TROMETHAMINE 15 MG/ML IJ SOLN
15.0000 mg | Freq: Once | INTRAMUSCULAR | Status: AC
  Administered 2023-02-03: 15 mg via INTRAVENOUS
  Filled 2023-02-03: qty 1

## 2023-02-03 MED ORDER — CEPACOL 15-2.3 MG MT LOZG: 1.0000 | LOZENGE | Freq: Four times a day (QID) | OROMUCOSAL | 0 refills | Status: AC

## 2023-02-03 MED ORDER — IOHEXOL 300 MG/ML  SOLN
75.0000 mL | Freq: Once | INTRAMUSCULAR | Status: AC | PRN
  Administered 2023-02-03: 100 mL via INTRAVENOUS

## 2023-02-03 MED ORDER — AMOXICILLIN-POT CLAVULANATE 875-125 MG PO TABS
1.0000 | ORAL_TABLET | Freq: Once | ORAL | Status: AC
  Administered 2023-02-03: 1 via ORAL
  Filled 2023-02-03: qty 1

## 2023-02-03 MED ORDER — DEXAMETHASONE SODIUM PHOSPHATE 10 MG/ML IJ SOLN
10.0000 mg | Freq: Once | INTRAMUSCULAR | Status: AC
  Administered 2023-02-03: 10 mg via INTRAVENOUS
  Filled 2023-02-03: qty 1

## 2023-02-03 NOTE — ED Triage Notes (Addendum)
Patient arrived POV. Patient reports right side flank pain x1 week. Tender to touch. No known injury. Also reports left side jaw pain yesterday, then woke up with swelling near chin, left side. Denies SOB or any other symptoms. AAOX4, respirations even and unlabored. Airway patent. Able to speak in full sentences. NAD noted.

## 2023-02-03 NOTE — ED Provider Notes (Signed)
[place] EMERGENCY DEPARTMENT AT [facility] Provider Note  CSN: [id number] Arrival date & time: 02/03/23 1112  Chief Complaint(s) Flank Pain (States painful, sore to touch. Painful for a week)  HPI Shannon [name] is a 67 y.o. female with PMH fibromyalgia, herniated status post surgical repair, restless legs, sarcoidosis who presents emergency department for evaluation of multiple complaints including flank pain, abdominal pain and jaw swelling.  Patient states that over the last 1 week she has had progressive worsening right flank pain and right lower quadrant abdominal pain.  She states that she was unable to get her dentures out last night and slept with the men but this morning woke up with a painful swelling under the angle of the mandible on the left and she is having difficulty swallowing secondary to the pain.  Denies current chest pain, shortness of breath, fever, nausea, vomiting or other systemic symptoms.   Past Medical History Past Medical History:  Diagnosis Date   Arthritis    Back pain    Constipation    Depression    Fibromyalgia    Headache    Herniated disc    Hypertension    Joint pain    Knee pain    Lower extremity edema    Pre-diabetes    Prediabetes    Restless leg syndrome    Sarcoidosis 2000   Sciatica    Scoliosis    Snoring    SOB (shortness of breath)    Vitamin D deficiency    Patient Active Problem List   Diagnosis Date Noted   Lumbar adjacent segment disease with spondylolisthesis 02/26/2022   S/P lumbar spinal fusion 06/28/2021   Spondylolisthesis of lumbar region 06/28/2021   Constipation 02/21/2021   Diabetes mellitus (HCC) 11/26/2020   Chronic pain syndrome 11/26/2020   Appendicitis 10/28/2017   Cigarette smoker 07/26/2015   Dyspnea 05/29/2015   BACK PAIN, LUMBAR 05/29/2008   SARCOIDOSIS 12/21/2007   FATIGUE, CHRONIC 12/21/2007   RECTAL BLEEDING 12/14/2007   NECK PAIN, ACUTE 12/14/2007   ABDOMINAL PAIN, GENERALIZED  12/14/2007   DEPRESSION 05/13/2007   Hypertension associated with type 2 diabetes mellitus (HCC) 05/13/2007   PERIODONTAL DISEASE 05/13/2007   HEMATURIA 05/13/2007   ALLERGY 05/13/2007   Class 3 severe obesity with serious comorbidity and body mass index (BMI) of 40.0 to 44.9 in adult (HCC) 05/07/2007   Home Medication(s) Prior to Admission medications   Medication Sig Start Date End Date Taking? Authorizing Provider  amoxicillin-clavulanate (AUGMENTIN) 875-125 MG tablet Take 1 tablet by mouth every 12 (twelve) hours. 02/03/23  Yes Davonda Ausley, MD  Benzocaine-Menthol (CEPACOL) 15-2.3 MG LOZG Use as directed 1 lozenge in the mouth or throat every 6 (six) hours. 02/03/23  Yes Nohelani Benning, MD  naproxen (NAPROSYN) 375 MG tablet Take 1 tablet (375 mg total) by mouth 2 (two) times daily. 02/03/23  Yes Jaicee Michelotti, MD  acetaminophen (TYLENOL) 650 MG CR tablet Take 1,300 mg by mouth every 8 (eight) hours as needed for pain.    [provider]  Alpha-D-Galactosidase Charlyne Quale) TABS Take 1 tablet by mouth daily.    [provider]  amLODipine (NORVASC) 10 MG tablet Take 10 mg by mouth daily.    [provider]  amLODipine (NORVASC) 5 MG tablet Take 5 mg by mouth daily.    [provider]  aspirin EC 81 MG tablet Take 81 mg by mouth daily.    [provider]  Aspirin-Salicylamide-Caffeine (ARTHRITIS STRENGTH BC POWDER PO) Take  1 packet by mouth daily as needed (pain).    [provider]  Cholecalciferol (VITAMIN D) 125 MCG (5000 UT) CAPS Take 5,000 Units by mouth in the morning.    [provider]  diclofenac Sodium (ALEVE ARTHRITIS PAIN) 1 % GEL Apply 1 Application topically 4 (four) times daily as needed (pain).    [provider]  DULoxetine (CYMBALTA) 20 MG capsule Take 20 mg by mouth 2 (two) times daily. 09/21/17   [provider]  Ferrous Sulfate (SLOW FE) 142 (45 Fe) MG TBCR Take 1 tablet by mouth daily.     [provider]  fluticasone (FLONASE) 50 MCG/ACT nasal spray Place 1 spray into both nostrils daily as needed for allergies or rhinitis.    [provider]  gabapentin (NEURONTIN) 300 MG capsule Take 300 mg by mouth at bedtime.    [provider]  hydrALAZINE (APRESOLINE) 50 MG tablet Take 50 mg by mouth 2 (two) times daily.    [provider]  hydrochlorothiazide (MICROZIDE) 12.5 MG capsule Take 12.5 mg by mouth in the morning.    [provider]  lactulose (CHRONULAC) 10 GM/15ML solution Take 20 g by mouth daily as needed for mild constipation.    [provider]  losartan (COZAAR) 100 MG tablet Take 100 mg by mouth every morning. 10/22/20   [provider]  Menthol-Camphor (TIGER BALM ARTHRITIS RUB EX) Apply 1 Application topically daily as needed (pain).    [provider]  metoCLOPramide (REGLAN) 10 MG tablet Take 1 tablet (10 mg total) by mouth every 6 (six) hours as needed for nausea. 01/08/23   Gwyneth Sprout, MD  pantoprazole (PROTONIX) 20 MG tablet Take 1 tablet (20 mg total) by mouth daily. 11/18/16   Arby Barrette, MD  pantoprazole (PROTONIX) 20 MG tablet Take 1 tablet (20 mg total) by mouth daily. 01/08/23   Gwyneth Sprout, MD  potassium chloride SA (KLOR-CON) 20 MEQ tablet Take 20 mEq by mouth every evening. 10/22/20   [provider]  Probiotic Product (PROBIOTIC PO) Take 1 capsule by mouth daily.    [provider]  rOPINIRole (REQUIP) 0.5 MG tablet Take 0.5 mg by mouth at bedtime. 06/03/15   [provider]  rosuvastatin (CRESTOR) 10 MG tablet Take 10 mg by mouth daily. 05/02/22   [provider]  Semaglutide,0.25 or 0.5MG /DOS, (OZEMPIC, 0.25 OR 0.5 MG/DOSE,) 2 MG/1.5ML SOPN Inject 0.5 mg into the skin every Sunday.    [provider]  tiZANidine (ZANAFLEX) 4 MG tablet Take 4 mg by mouth at bedtime. Patient not taking: Reported on 09/24/2022 07/08/22   [provider]                                                                                                                                    Past Surgical History Past Surgical History:  Procedure Laterality Date   ABDOMINAL HYSTERECTOMY  1999  APPENDECTOMY  2019   BACK SURGERY     BREAST BIOPSY Right 12/2016   LAPAROSCOPIC APPENDECTOMY N/A 10/28/2017   Procedure: APPENDECTOMY LAPAROSCOPIC;  Surgeon: Romie Levee, MD;  Location: [facility];  Service: General;  Laterality: N/A;   TUBAL LIGATION     Family History Family History  Problem Relation Age of Onset   Hypertension Mother    Heart disease Mother    Diabetes Father    Hypertension Father     Social History Social History   Tobacco Use   Smoking status: Former    Packs/day: 0.50    Years: 46.00    Additional pack years: 0.00    Total pack years: 23.00    Types: Cigarettes    Quit date: 10/23/2017    Years since quitting: 5.2   Smokeless tobacco: Never  Vaping Use   Vaping Use: Never used  Substance Use Topics   Alcohol use: Yes    Comment: 1-2 beers a month   Drug use: No   Allergies Oxycodone, Propoxyphene n-acetaminophen, and Prednisone  Review of Systems Review of Systems  HENT:  Positive for facial swelling.   Genitourinary:  Positive for flank pain.    Physical Exam Vital Signs  I have reviewed the triage vital signs BP (!) 138/92   Pulse 82   Temp 98.8 F (37.1 C) (Oral)   Resp 18   Ht 5\' 6"  (1.676 m)   Wt 124 kg   SpO2 99%   BMI 44.12 kg/m   Physical Exam Vitals and nursing note reviewed.  Constitutional:      General: She is not in acute distress.    Appearance: She is well-developed.  HENT:     Head: Normocephalic and atraumatic.     Comments: Left-sided mandibular swelling, tender to palpation Eyes:     Conjunctiva/sclera: Conjunctivae normal.  Cardiovascular:     Rate and Rhythm: Normal rate and regular rhythm.     Heart sounds: No murmur heard. Pulmonary:     Effort:  Pulmonary effort is normal. No respiratory distress.     Breath sounds: Normal breath sounds.  Abdominal:     Palpations: Abdomen is soft.     Tenderness: There is abdominal tenderness. There is right CVA tenderness.  Musculoskeletal:        General: No swelling.     Cervical back: Neck supple.  Skin:    General: Skin is warm and dry.     Capillary Refill: Capillary refill takes less than 2 seconds.  Neurological:     Mental Status: She is alert.  Psychiatric:        Mood and Affect: Mood normal.     ED Results and Treatments Labs (all labs ordered are listed, but only abnormal results are displayed) Labs Reviewed  COMPREHENSIVE METABOLIC PANEL - Abnormal; Notable for the following components:      Result Value   Potassium 3.2 (*)    Glucose, Bld 145 (*)    All other components within normal limits  CBC WITH DIFFERENTIAL/PLATELET - Abnormal; Notable for the following components:   WBC 13.9 (*)    Neutro Abs 11.3 (*)    All other components within normal limits  URINALYSIS, ROUTINE W REFLEX MICROSCOPIC  TSH  LIPASE, BLOOD  Radiology CT Soft Tissue Neck W Contrast  Result Date: 02/03/2023 CLINICAL DATA:  Neck mass, nonpulsatile L sided submandibular swelling EXAM: CT NECK WITH CONTRAST TECHNIQUE: Multidetector CT imaging of the neck was performed using the standard protocol following the bolus administration of intravenous contrast. RADIATION DOSE REDUCTION: This exam was performed according to the departmental dose-optimization program which includes automated exposure control, adjustment of the mA and/or kV according to patient size and/or use of iterative reconstruction technique. CONTRAST:  OMNIPAQUE IOHEXOL 300 MG/ML  SOLN COMPARISON:  CT Neck 01/01/14 FINDINGS: Pharynx and larynx: Bilateral tonsils are normal in appearance. Bilateral parapharyngeal  spaces are normal in appearance. The epiglottis is normal in appearance. No finding to suggest pharyngitis. Salivary glands: No inflammation, mass, or stone. There is an approximate 2.9 x 1.7 x 0.8 cm region of asymmetric soft tissue in the left submandibular region (series 2, image 62). This is new compared to 2015. There is also mild asymmetric soft tissue stranding in the subcutaneous soft tissues surrounding this region (series 2, image 63). Thyroid: Subcentimeter right thyroid nodule. Lymph nodes: No cervical lymphadenopathy. Vascular: Negative. Limited intracranial: Negative. Visualized orbits: Negative. Mastoids and visualized paranasal sinuses: Clear. Skeleton: No acute or aggressive process. Upper chest: Negative. Other: None. IMPRESSION: 2.9 x 1.7 x 0.8 cm region of asymmetric soft tissue in the left submandibular space (anterior to the left submandibular gland) . This is new compared to 2015. There is also mild asymmetric soft tissue stranding in the subcutaneous soft tissues surrounding this region. This is nonspecific and could represent an inflamed sublingual gland, but underlying malignancy is not entirely excluded. Recommend direct visualization and consider 3-6 month follow up CT. No evidence of cervical lymphadenopathy. Electronically Signed   By: Lorenza Cambridge M.D.   On: 02/03/2023 14:59   CT ABDOMEN PELVIS W CONTRAST  Result Date: 02/03/2023 CLINICAL DATA:  Right lower quadrant abdominal pain EXAM: CT ABDOMEN AND PELVIS WITH CONTRAST TECHNIQUE: Multidetector CT imaging of the abdomen and pelvis was performed using the standard protocol following bolus administration of intravenous contrast. RADIATION DOSE REDUCTION: This exam was performed according to the departmental dose-optimization program which includes automated exposure control, adjustment of the mA and/or kV according to patient size and/or use of iterative reconstruction technique. CONTRAST:  OMNIPAQUE IOHEXOL 300 MG/ML  SOLN  COMPARISON:  CT abdomen and pelvis dated November 08, 2022 FINDINGS: Lower chest: No acute abnormality. Hepatobiliary: No focal liver abnormality is seen. No gallstones, gallbladder wall thickening, or biliary dilatation. Pancreas: Mild scattered pancreatic calcifications. No pancreatic ductal dilatation or surrounding inflammatory changes. Spleen: Normal in size without focal abnormality. Adrenals/Urinary Tract: Unchanged bilateral adrenal gland adenomas. No hydronephrosis nephrolithiasis. Bladder is unremarkable. Stomach/Bowel: Stomach is within normal limits. No evidence of bowel wall thickening, distention, or inflammatory changes. Vascular/Lymphatic: Aortic atherosclerosis. No enlarged abdominal or pelvic lymph nodes. Reproductive: Status post hysterectomy. No adnexal masses. Other: Stable tiny fat containing midline supraumbilical ventral hernia. No abdominopelvic ascites. Musculoskeletal: Posterior fusion hardware of the lumbar spine. No aggressive appearing osseous lesions. IMPRESSION: 1. No acute findings in the abdomen or pelvis. 2. Mild pancreatic calcifications, likely sequela of chronic pancreatitis. 3. Aortic Atherosclerosis (ICD10-I70.0). Electronically Signed   By: Allegra Lai M.D.   On: 02/03/2023 14:52    Pertinent labs & imaging results that were available during my care of the patient were reviewed by me and considered in my medical decision making (see MDM for details).  Medications Ordered in ED Medications  iohexol (OMNIPAQUE) 300 MG/ML  solution 75 mL (100 mLs Intravenous Contrast Given 02/03/23 1419)  dexamethasone (DECADRON) injection 10 mg (10 mg Intravenous Given 02/03/23 1520)  amoxicillin-clavulanate (AUGMENTIN) 875-125 MG per tablet 1 tablet (1 tablet Oral Given 02/03/23 1520)  lidocaine (XYLOCAINE) 2 % viscous mouth solution 15 mL (15 mLs Mouth/Throat Given 02/03/23 1520)  ketorolac (TORADOL) 15 MG/ML injection 15 mg (15 mg Intravenous Given 02/03/23 1521)                                                                                                                                      Procedures Procedures  (including critical care time)  Medical Decision Making / ED Course   This patient presents to the ED for concern of flank pain, facial swelling, this involves an extensive number of treatment options, and is a complaint that carries with it a high risk of complications and morbidity.  The differential diagnosis includes nephrolithiasis, muscle strain, pyelonephritis, parotitis, sublingual salivary gland swelling, dental infection, RPA  MDM: Patient seen emergency room for multiple complaints described above.  Physical exam with a tender swelling under the angle of the mandible on the left and right-sided CVA tenderness.  Laboratory evaluation with leukocytosis to 13.9, hypokalemia to 3.2 but is otherwise unremarkable.  Urinalysis unremarkable.  CT abdomen pelvis is unremarkable with no evidence of nephrolithiasis or pyelonephritis.  CT soft tissue neck showing a swelling of the sublingual gland on the left.  I spoke with Dr. Jearld Fenton of ENT who is recommending steroids, antibiotics and outpatient follow-up if symptoms not improved.  Suspect possible salivary blockage due to persistent nighttime use of dentures as the patient had trouble getting out of her mouth last night.  She was encouraged to keep her dentures out over the next 24 hours and take her antibiotics to completion.  At this time she does not meet inpatient criteria for admission she is safe for discharge with outpatient follow-up.   Additional history obtained:  -External records from outside source obtained and reviewed including: Chart review including previous notes, labs, imaging, consultation notes   Lab Tests: -I ordered, reviewed, and interpreted labs.   The pertinent results include:   Labs Reviewed  COMPREHENSIVE METABOLIC PANEL - Abnormal; Notable for the following components:      Result Value    Potassium 3.2 (*)    Glucose, Bld 145 (*)    All other components within normal limits  CBC WITH DIFFERENTIAL/PLATELET - Abnormal; Notable for the following components:   WBC 13.9 (*)    Neutro Abs 11.3 (*)    All other components within normal limits  URINALYSIS, ROUTINE W REFLEX MICROSCOPIC  TSH  LIPASE, BLOOD         Imaging Studies ordered: I ordered imaging studies including CT abdomen pelvis, CT soft tissue neck I independently visualized and interpreted imaging. I agree with the radiologist interpretation   Medicines ordered and prescription drug management: Meds  ordered this encounter  Medications   iohexol (OMNIPAQUE) 300 MG/ML solution 75 mL   dexamethasone (DECADRON) injection 10 mg   amoxicillin-clavulanate (AUGMENTIN) 875-125 MG per tablet 1 tablet   lidocaine (XYLOCAINE) 2 % viscous mouth solution 15 mL   DISCONTD: lidocaine (LIDODERM) 5 % 1 patch   ketorolac (TORADOL) 15 MG/ML injection 15 mg   amoxicillin-clavulanate (AUGMENTIN) 875-125 MG tablet    Sig: Take 1 tablet by mouth every 12 (twelve) hours.    Dispense:  14 tablet    Refill:  0   naproxen (NAPROSYN) 375 MG tablet    Sig: Take 1 tablet (375 mg total) by mouth 2 (two) times daily.    Dispense:  20 tablet    Refill:  0   Benzocaine-Menthol (CEPACOL) 15-2.3 MG LOZG    Sig: Use as directed 1 lozenge in the mouth or throat every 6 (six) hours.    Dispense:  16 lozenge    Refill:  0    -I have reviewed the patients home medicines and have made adjustments as needed  Critical interventions none  Consultations Obtained: I requested consultation with the ENT on-call Dr. Jearld Fenton,  and discussed lab and imaging findings as well as pertinent plan - they recommend: Antibiotics, steroids, outpatient follow-up   Cardiac Monitoring: The patient was maintained on a cardiac monitor.  I personally viewed and interpreted the cardiac monitored which showed an underlying rhythm of: NSR  Social Determinants  of Health:  Factors impacting patients care include: none   Reevaluation: After the interventions noted above, I reevaluated the patient and found that they have :improved  Co morbidities that complicate the patient evaluation  Past Medical History:  Diagnosis Date   Arthritis    Back pain    Constipation    Depression    Fibromyalgia    Headache    Herniated disc    Hypertension    Joint pain    Knee pain    Lower extremity edema    Pre-diabetes    Prediabetes    Restless leg syndrome    Sarcoidosis 2000   Sciatica    Scoliosis    Snoring    SOB (shortness of breath)    Vitamin D deficiency       Dispostion: I considered admission for this patient, but at this time she does not meet inpatient criteria for admission and she is safe for discharge with outpatient follow-up     Final Clinical Impression(s) / ED Diagnoses Final diagnoses:  Flank pain  Sublingual gland swelling     @PCDICTATION @    Glendora Score, MD 02/04/23 [phone]

## 2023-02-03 NOTE — Discharge Instructions (Addendum)
You were seen in the emergency room for evaluation of facial swelling and flank pain.  Your imaging regarding your flank pain was reassuringly negative today but the imaging of the face is showing some swelling around the sublingual gland on the left.  Our hope is that this is transient and we will treat with a single dose of steroids today and antibiotics for 1 week.  If you are still having persistent symptoms.  Please call the number above to follow-up outpatient with ENT.  If you have any additional worsening symptoms please return to the emergency department for further evaluation.

## 2023-07-04 ENCOUNTER — Emergency Department (HOSPITAL_COMMUNITY)
Admission: EM | Admit: 2023-07-04 | Discharge: 2023-07-04 | Disposition: A | Discharge status: Home / Self Care | Payer: Medicare HMO | Attending: Emergency Medicine | Admitting: Emergency Medicine

## 2023-07-04 ENCOUNTER — Emergency Department (HOSPITAL_COMMUNITY): Payer: Medicare HMO

## 2023-07-04 DIAGNOSIS — Z79899 Other long term (current) drug therapy: Secondary | ICD-10-CM | POA: Diagnosis not present

## 2023-07-04 DIAGNOSIS — I10 Essential (primary) hypertension: Secondary | ICD-10-CM | POA: Diagnosis not present

## 2023-07-04 DIAGNOSIS — Z7982 Long term (current) use of aspirin: Secondary | ICD-10-CM | POA: Insufficient documentation

## 2023-07-04 DIAGNOSIS — M25572 Pain in left ankle and joints of left foot: Secondary | ICD-10-CM | POA: Insufficient documentation

## 2023-07-04 MED ORDER — GABAPENTIN 100 MG PO CAPS: 100.0000 mg | ORAL_CAPSULE | Freq: Three times a day (TID) | ORAL | 0 refills | Status: AC

## 2023-07-04 MED ORDER — AMLODIPINE BESYLATE 5 MG PO TABS: 10.0000 mg | ORAL_TABLET | Freq: Once | ORAL | Status: DC

## 2023-07-04 NOTE — ED Triage Notes (Signed)
Patient reports left ankle pain x 1 month Denies injury Pain 10/10 Says it hurts worse at night, burning, shooting pain  Patient has elevated BP in triage Takes medication but did not take this morning Patient was provided education on importance of taking bp medications

## 2023-07-04 NOTE — ED Provider Notes (Signed)
[place] EMERGENCY DEPARTMENT AT [facility] Provider Note   CSN: [id number] Arrival date & time: 07/04/23  5732     History  Chief Complaint  Patient presents with   Ankle Pain    Shannon [name] is a 67 y.o. female.  The history is provided by the patient and medical records. No language interpreter was used.  Ankle Pain    67 year old female with significant history of hypertension, arthritis, scoliosis, fibromyalgia, joint pain, sarcoidosis presenting with complaint of ankle pain.  Patient report for the past month she has had recurrent pain primarily to her left foot and ankle.  She described pain as a burning sharp stabbing sensation more noticeable to the medial aspect of her foot and ankle worse with ambulation but more noticeable at night when she lays in bed.  She does not endorse any fever or chills no recent trauma no redness and no numbness.  She mention she has recurrent joint pain throughout her body including her back shoulders, legs for which she takes over-the-counter medication for with some improvement.  She does not endorse any bowel bladder incontinence or saddle anesthesia.  She denies any tenderness to her calf no history of PE or DVT.  Home Medications Prior to Admission medications   Medication Sig Start Date End Date Taking? Authorizing Provider  acetaminophen (TYLENOL) 650 MG CR tablet Take 1,300 mg by mouth every 8 (eight) hours as needed for pain.    [provider]  Alpha-D-Galactosidase Charlyne Quale) TABS Take 1 tablet by mouth daily.    [provider]  amLODipine (NORVASC) 10 MG tablet Take 10 mg by mouth daily.    [provider]  amLODipine (NORVASC) 5 MG tablet Take 5 mg by mouth daily.    [provider]  amoxicillin-clavulanate (AUGMENTIN) 875-125 MG tablet Take 1 tablet by mouth every 12 (twelve) hours. 02/03/23   Kommor, Wyn Forster, MD  aspirin EC 81 MG tablet Take 81 mg by mouth daily.    [provider]  Aspirin-Salicylamide-Caffeine (ARTHRITIS STRENGTH BC POWDER PO) Take 1 packet by mouth daily as needed (pain).    [provider]  Benzocaine-Menthol (CEPACOL) 15-2.3 MG LOZG Use as directed 1 lozenge in the mouth or throat every 6 (six) hours. 02/03/23   Kommor, Madison, MD  Cholecalciferol (VITAMIN D) 125 MCG (5000 UT) CAPS Take 5,000 Units by mouth in the morning.    [provider]  diclofenac Sodium (ALEVE ARTHRITIS PAIN) 1 % GEL Apply 1 Application topically 4 (four) times daily as needed (pain).    [provider]  DULoxetine (CYMBALTA) 20 MG capsule Take 20 mg by mouth 2 (two) times daily. 09/21/17   [provider]  Ferrous Sulfate (SLOW FE) 142 (45 Fe) MG TBCR Take 1 tablet by mouth daily.    [provider]  fluticasone (FLONASE) 50 MCG/ACT nasal spray Place 1 spray into both nostrils daily as needed for allergies or rhinitis.    [provider]  gabapentin (NEURONTIN) 300 MG capsule Take 300 mg by mouth at bedtime.    [provider]  hydrALAZINE (APRESOLINE) 50 MG tablet Take 50 mg by mouth 2 (two) times daily.    [provider]  hydrochlorothiazide (MICROZIDE) 12.5 MG capsule Take 12.5 mg by mouth in the morning.    [provider]  lactulose (CHRONULAC) 10 GM/15ML solution Take 20 g by mouth daily as needed for mild constipation.    [provider]  losartan (COZAAR) 100  MG tablet Take 100 mg by mouth every morning. 10/22/20   [provider]  Menthol-Camphor (TIGER BALM ARTHRITIS RUB EX) Apply 1 Application topically daily as needed (pain).    [provider]  metoCLOPramide (REGLAN) 10 MG tablet Take 1 tablet (10 mg total) by mouth every 6 (six) hours as needed for nausea. 01/08/23   Gwyneth Sprout, MD  naproxen (NAPROSYN) 375 MG tablet Take 1 tablet (375 mg total) by mouth 2 (two) times daily. 02/03/23   Kommor, Madison, MD  pantoprazole (PROTONIX) 20 MG tablet  Take 1 tablet (20 mg total) by mouth daily. 11/18/16   Arby Barrette, MD  pantoprazole (PROTONIX) 20 MG tablet Take 1 tablet (20 mg total) by mouth daily. 01/08/23   Gwyneth Sprout, MD  potassium chloride SA (KLOR-CON) 20 MEQ tablet Take 20 mEq by mouth every evening. 10/22/20   [provider]  Probiotic Product (PROBIOTIC PO) Take 1 capsule by mouth daily.    [provider]  rOPINIRole (REQUIP) 0.5 MG tablet Take 0.5 mg by mouth at bedtime. 06/03/15   [provider]  rosuvastatin (CRESTOR) 10 MG tablet Take 10 mg by mouth daily. 05/02/22   [provider]  Semaglutide,0.25 or 0.5MG /DOS, (OZEMPIC, 0.25 OR 0.5 MG/DOSE,) 2 MG/1.5ML SOPN Inject 0.5 mg into the skin every Sunday.    [provider]  tiZANidine (ZANAFLEX) 4 MG tablet Take 4 mg by mouth at bedtime. Patient not taking: Reported on 09/24/2022 07/08/22   [provider]      Allergies    Oxycodone, Propoxyphene n-acetaminophen, and Prednisone    Review of Systems   Review of Systems  All other systems reviewed and are negative.   Physical Exam Updated Vital Signs BP (!) 210/104 (BP Location: Left Arm)   Pulse 83   Temp 98 F (36.7 C) (Oral)   Resp 18   Ht 5\' 6"  (1.676 m)   Wt 119.3 kg   SpO2 99%   BMI 42.45 kg/m  Physical Exam Vitals and nursing note reviewed.  Constitutional:      General: She is not in acute distress.    Appearance: She is well-developed.  HENT:     Head: Atraumatic.  Eyes:     Conjunctiva/sclera: Conjunctivae normal.  Pulmonary:     Effort: Pulmonary effort is normal.  Musculoskeletal:        General: Tenderness (Left foot/ankle: Tenderness even to gentle palpation of the skin of both foot and ankle however there is no overlying skin changes, DP pulse 2+, brisk cap refill, no deformity, able to perform full range of motion to the ankle) present.     Cervical back: Neck supple.     Comments: No peripheral edema no calf tenderness  Skin:     Findings: No rash.  Neurological:     Mental Status: She is alert.  Psychiatric:        Mood and Affect: Mood normal.     ED Results / Procedures / Treatments   Labs (all labs ordered are listed, but only abnormal results are displayed) Labs Reviewed - No data to display  EKG None  Radiology DG Ankle Complete Left  Result Date: 07/04/2023 CLINICAL DATA:  Ankle pain for 1 month EXAM: LEFT ANKLE COMPLETE - 3+ VIEW COMPARISON:  None Available. FINDINGS: There is no acute fracture or dislocation. A well corticated ossific fragment adjacent to the medial malleolus may reflect sequela of prior trauma. Ankle alignment is maintained. The ankle mortise is intact. The  joint spaces are preserved. There is mild Achilles enthesopathy and inferior calcaneal spurring. The soft tissues are unremarkable. IMPRESSION: 1. No acute osseous finding. 2. Probable remote trauma of the medial malleolus. 3. Mild inferior calcaneal spurring and Achilles enthesopathy. Electronically Signed   By: Lesia Hausen M.D.   On: 07/04/2023 11:28    Procedures Procedures    Medications Ordered in ED Medications - No data to display  ED Course/ Medical Decision Making/ A&P                                 Medical Decision Making  BP (!) 154/87 (BP Location: Left Arm)   Pulse 76   Temp 98 F (36.7 C) (Oral)   Resp 17   Ht 5\' 6"  (1.676 m)   Wt 119.3 kg   SpO2 97%   BMI 42.45 kg/m   42:61 PM  67 year old female with significant history of hypertension, arthritis, scoliosis, fibromyalgia, joint pain, sarcoidosis presenting with complaint of ankle pain.  Patient report for the past month she has had recurrent pain primarily to her left foot and ankle.  She described pain as a burning sharp stabbing sensation more noticeable to the medial aspect of her foot and ankle worse with ambulation but more noticeable at night when she lays in bed.  She does not endorse any fever or chills no recent trauma no redness and no  numbness.  She mention she has recurrent joint pain throughout her body including her back shoulders, legs for which she takes over-the-counter medication for with some improvement.  She does not endorse any bowel bladder incontinence or saddle anesthesia.  She denies any tenderness to her calf no history of PE or DVT.  Exam remarkable for tenderness to gentle palpation about the left ankle and foot without any signs of cellulitis no obvious deformities to suggest fracture or dislocation no swelling to suggest gout or septic joint or cellulitis.  X-ray left ankle obtained independent viewed interpreted by me without any acute finding agree with radiology interpretation.  I suspect her discomfort is likely secondary to some had a neuropathic pain.  I will prescribe gabapentin for pain control but encouraged patient to follow-up with PCP or with orthopedic specialist for further care.  Patient was noted to have elevated blood pressure.  On recheck her blood pressure did improve.  Encouraged patient to monitor her blood pressure closely and take her medication as prescribed.  She attributed her elevated blood pressure due to her pain.  Patient overall well-appearing able to ambulate stable for discharge.        Final Clinical Impression(s) / ED Diagnoses Final diagnoses:  Acute left ankle pain    Rx / DC Orders ED Discharge Orders          Ordered    gabapentin (NEURONTIN) 100 MG capsule  3 times daily        07/04/23 1226              Fayrene Helper, PA-C 07/04/23 1227    Derwood Kaplan, MD 07/05/23 1204

## 2023-07-04 NOTE — Discharge Instructions (Addendum)
Your pain is likely nerve pain.  Please take gabapentin as prescribed for pain control.  Follow-up closely with your primary care doctor or with an orthopedist for further care.  Your blood pressure is elevated today, make sure to take your medication as prescribed and have your doctor recheck your blood pressure at your earliest convenience.

## 2023-09-30 ENCOUNTER — Other Ambulatory Visit: Payer: Self-pay | Admitting: Nurse Practitioner

## 2023-09-30 DIAGNOSIS — Z1231 Encounter for screening mammogram for malignant neoplasm of breast: Secondary | ICD-10-CM

## 2023-10-15 ENCOUNTER — Ambulatory Visit
Admission: RE | Admit: 2023-10-15 | Discharge: 2023-10-15 | Disposition: A | Discharge status: Home / Self Care | Payer: 59 | Source: Ambulatory Visit | Attending: Nurse Practitioner

## 2023-10-15 DIAGNOSIS — Z1231 Encounter for screening mammogram for malignant neoplasm of breast: Secondary | ICD-10-CM

## 2024-01-29 ENCOUNTER — Emergency Department (HOSPITAL_COMMUNITY)
Admission: EM | Admit: 2024-01-29 | Discharge: 2024-01-29 | Disposition: A | Discharge status: Home / Self Care | Attending: Emergency Medicine | Admitting: Emergency Medicine

## 2024-01-29 ENCOUNTER — Encounter (HOSPITAL_COMMUNITY): Payer: Self-pay

## 2024-01-29 ENCOUNTER — Emergency Department (HOSPITAL_COMMUNITY)

## 2024-01-29 ENCOUNTER — Other Ambulatory Visit: Payer: Self-pay

## 2024-01-29 DIAGNOSIS — E119 Type 2 diabetes mellitus without complications: Secondary | ICD-10-CM | POA: Diagnosis not present

## 2024-01-29 DIAGNOSIS — R42 Dizziness and giddiness: Secondary | ICD-10-CM | POA: Insufficient documentation

## 2024-01-29 DIAGNOSIS — I10 Essential (primary) hypertension: Secondary | ICD-10-CM | POA: Diagnosis not present

## 2024-01-29 DIAGNOSIS — E876 Hypokalemia: Secondary | ICD-10-CM | POA: Diagnosis not present

## 2024-01-29 DIAGNOSIS — R1084 Generalized abdominal pain: Secondary | ICD-10-CM | POA: Diagnosis not present

## 2024-01-29 DIAGNOSIS — Z79899 Other long term (current) drug therapy: Secondary | ICD-10-CM | POA: Diagnosis not present

## 2024-01-29 LAB — CBC WITH DIFFERENTIAL/PLATELET
Abs Immature Granulocytes: 0.03 10*3/uL (ref 0.00–0.07)
Basophils Absolute: 0 10*3/uL (ref 0.0–0.1)
Basophils Relative: 0 %
Eosinophils Absolute: 0.1 10*3/uL (ref 0.0–0.5)
Eosinophils Relative: 1 %
HCT: 42.4 % (ref 36.0–46.0)
Hemoglobin: 13.4 g/dL (ref 12.0–15.0)
Immature Granulocytes: 0 %
Lymphocytes Relative: 25 %
Lymphs Abs: 2.3 10*3/uL (ref 0.7–4.0)
MCH: 27.4 pg (ref 26.0–34.0)
MCHC: 31.6 g/dL (ref 30.0–36.0)
MCV: 86.7 fL (ref 80.0–100.0)
Monocytes Absolute: 0.5 10*3/uL (ref 0.1–1.0)
Monocytes Relative: 6 %
Neutro Abs: 6.1 10*3/uL (ref 1.7–7.7)
Neutrophils Relative %: 68 %
Platelets: 253 10*3/uL (ref 150–400)
RBC: 4.89 MIL/uL (ref 3.87–5.11)
RDW: 13.9 % (ref 11.5–15.5)
WBC: 9.1 10*3/uL (ref 4.0–10.5)
nRBC: 0 % (ref 0.0–0.2)

## 2024-01-29 LAB — COMPREHENSIVE METABOLIC PANEL WITH GFR
ALT: 19 U/L (ref 0–44)
AST: 21 U/L (ref 15–41)
Albumin: 3.7 g/dL (ref 3.5–5.0)
Alkaline Phosphatase: 79 U/L (ref 38–126)
Anion gap: 9 (ref 5–15)
BUN: 13 mg/dL (ref 8–23)
CO2: 25 mmol/L (ref 22–32)
Calcium: 9.4 mg/dL (ref 8.9–10.3)
Chloride: 108 mmol/L (ref 98–111)
Creatinine, Ser: 0.93 mg/dL (ref 0.44–1.00)
GFR, Estimated: >60 mL/min (ref >60)
Glucose, Bld: 98 mg/dL (ref 70–99)
Potassium: 2.8 mmol/L — ABNORMAL LOW (ref 3.5–5.1)
Sodium: 142 mmol/L (ref 135–145)
Total Bilirubin: 0.5 mg/dL (ref 0.0–1.2)
Total Protein: 7.6 g/dL (ref 6.5–8.1)

## 2024-01-29 LAB — URINALYSIS, ROUTINE W REFLEX MICROSCOPIC
Bilirubin Urine: NEGATIVE
Glucose, UA: NEGATIVE mg/dL
Ketones, ur: NEGATIVE mg/dL
Leukocytes,Ua: NEGATIVE
Nitrite: NEGATIVE
Protein, ur: NEGATIVE mg/dL
Specific Gravity, Urine: 1.015 (ref 1.005–1.030)
pH: 6 (ref 5.0–8.0)

## 2024-01-29 LAB — TROPONIN I (HIGH SENSITIVITY)
Troponin I (High Sensitivity): 18 ng/L — ABNORMAL HIGH (ref <18)
Troponin I (High Sensitivity): 22 ng/L — ABNORMAL HIGH (ref <18)

## 2024-01-29 LAB — CBG MONITORING, ED: Glucose-Capillary: 126 mg/dL — ABNORMAL HIGH (ref 70–99)

## 2024-01-29 LAB — LIPASE, BLOOD: Lipase: 30 U/L (ref 11–51)

## 2024-01-29 MED ORDER — MECLIZINE HCL 25 MG PO TABS: 25.0000 mg | ORAL_TABLET | Freq: Three times a day (TID) | ORAL | 0 refills | Status: AC | PRN

## 2024-01-29 MED ORDER — LORAZEPAM 2 MG/ML IJ SOLN
1.0000 mg | Freq: Once | INTRAMUSCULAR | Status: AC | PRN
  Administered 2024-01-29: 1 mg via INTRAVENOUS
  Filled 2024-01-29: qty 1

## 2024-01-29 MED ORDER — POTASSIUM CHLORIDE CRYS ER 20 MEQ PO TBCR
40.0000 meq | EXTENDED_RELEASE_TABLET | Freq: Once | ORAL | Status: AC
  Administered 2024-01-29: 40 meq via ORAL
  Filled 2024-01-29: qty 2

## 2024-01-29 MED ORDER — ONDANSETRON 8 MG PO TBDP: 8.0000 mg | ORAL_TABLET | Freq: Three times a day (TID) | ORAL | 0 refills | Status: AC | PRN

## 2024-01-29 MED ORDER — POTASSIUM CHLORIDE CRYS ER 20 MEQ PO TBCR: 20.0000 meq | EXTENDED_RELEASE_TABLET | Freq: Two times a day (BID) | ORAL | 0 refills | Status: AC

## 2024-01-29 MED ORDER — MORPHINE SULFATE (PF) 4 MG/ML IV SOLN
4.0000 mg | Freq: Once | INTRAVENOUS | Status: AC
  Administered 2024-01-29: 4 mg via INTRAVENOUS
  Filled 2024-01-29: qty 1

## 2024-01-29 MED ORDER — MECLIZINE HCL 25 MG PO TABS
25.0000 mg | ORAL_TABLET | Freq: Once | ORAL | Status: AC
  Administered 2024-01-29: 25 mg via ORAL
  Filled 2024-01-29: qty 1

## 2024-01-29 MED ORDER — POTASSIUM CHLORIDE 10 MEQ/100ML IV SOLN
10.0000 meq | INTRAVENOUS | Status: AC
  Administered 2024-01-29 (×2): 10 meq via INTRAVENOUS
  Filled 2024-01-29 (×2): qty 100

## 2024-01-29 NOTE — ED Notes (Signed)
 Dr. Isaiah Marc and Trifan notified of pt sx and requested to evaluate pt in triage

## 2024-01-29 NOTE — ED Notes (Signed)
 EDP at [facility]

## 2024-01-29 NOTE — ED Provider Triage Note (Signed)
 Emergency Medicine Provider Triage Evaluation Note  NYRAH DEMOS , a 68 y.o. female  was evaluated in triage.  Pt complains of dizziness, nausea, vomiting which has been ongoing for approximate the past 4 days.  Symptoms have been gradually worsening.  No acute change in the past 24 hours.  Denies any numbness, paresthesias or unilateral weakness.  Admits to associated abdominal pain.  Review of Systems  Positive: Dizziness, nausea vomiting, abdominal pain Negative: Chest pain, shortness of breath, palpitations  Physical Exam  BP (!) 190/110   Pulse 81   Temp 98.5 F (36.9 C) (Oral)   Resp 18   Ht 5\' 6"  (1.676 m)   Wt 119.3 kg   SpO2 98%   BMI 42.45 kg/m  Gen:   Awake, no distress   Resp:  Normal effort  MSK:   Moves extremities without difficulty  Other:  Strength 5 out of 5 in bilateral upper and lower extremities, difficulty with finger-to-nose, intact rapid alternating movements  Medical Decision Making  Medically screening exam initiated at 2:54 PM.  Appropriate orders placed.  Benna Brasher was informed that the remainder of the evaluation will be completed by another provider, this initial triage assessment does not replace that evaluation, and the importance of remaining in the ED until their evaluation is complete.  Labs, imaging ordered.  Awaiting bed in the back at this time.  Tearful but no signs of acute distress.  Outside the window for acute stroke   Roselynn Connors, [place] 01/29/24 1455

## 2024-01-29 NOTE — Discharge Instructions (Addendum)
 Take the medications as prescribed for your potassium.  Take the meclizine  and Zofran  to help with dizziness and nausea.  Your test did not show any acute abnormality in the brain such as a stroke.  Your dizziness is likely related to your vertigo.  Follow-up with your primary care doctor to recheck on your potassium and your blood pressure.

## 2024-01-29 NOTE — ED Provider Notes (Signed)
 [place] EMERGENCY DEPARTMENT AT [facility] Provider Note   CSN: [id number] Arrival date & time: 01/29/24  1413     History  Chief Complaint  Patient presents with  . Dizziness    Shannon [name] is a 68 y.o. female.   Dizziness    Patient has a history of hypertension sciatica fibromyalgia restless leg syndrome diabetes sarcoidosis.  She presents to the ED with complaints of dizziness.  Patient states she started having symptoms on Tuesday.  Initially she thought it was her vertigo which she has been diagnosed with in the past.  She takes an over-the-counter supplement to help with those symptoms.  It was not working.  Patient states she feels like her vision is blurry.  She is having a headache.  Patient has been vomiting. She also reports some abdominal pain.  It is in her upper abdomen.  She has had some loose stools.   Home Medications Prior to Admission medications   Medication Sig Start Date End Date Taking? Authorizing Provider  ALEVE  220 MG tablet Take 220-440 mg by mouth 2 (two) times daily as needed (for pain or headaches).   Yes [provider]  amLODipine  (NORVASC ) 5 MG tablet Take 5 mg by mouth daily.   Yes [provider]  Camphor-Eucalyptus-Menthol  (VICKS VAPORUB EX) Apply 1 application  topically See admin instructions. Vicks VapoFreeze: Apply as directed to temporarily relieve minor muscle & joint aches and pains   Yes [provider]  Cholecalciferol  (VITAMIN D3) 125 MCG (5000 UT) TABS Take 5,000 Units by mouth daily.   Yes [provider]  DULoxetine  (CYMBALTA ) 60 MG capsule Take 60 mg by mouth daily.   Yes [provider]  EXCEDRIN MIGRAINE RELIEF 250-250-65 MG tablet Take 2 tablets by mouth daily as needed for migraine.   Yes [provider]  fluticasone  (FLONASE ) 50 MCG/ACT nasal spray Place 1 spray into both nostrils 2 (two) times daily as needed for allergies or rhinitis.   Yes [provider]  hydrALAZINE (APRESOLINE) 50 MG tablet Take 50 mg by mouth 2 (two) times daily.   Yes [provider]  lactulose (CHRONULAC) 10 GM/15ML solution Take 20 g by mouth daily as needed for mild constipation.   Yes [provider]  meclizine  (ANTIVERT ) 25 MG tablet Take 1 tablet (25 mg total) by mouth 3 (three) times daily as needed for dizziness. 01/29/24  Yes Trish Furl, MD  metoCLOPramide  (REGLAN ) 10 MG tablet Take 1 tablet (10 mg total) by mouth every 6 (six) hours as needed for nausea. 01/08/23  Yes Almond Army, MD  NON FORMULARY Take 2 capsules by mouth See admin instructions. Lipo-Flavonoid Balance Support caplets: Take 1-2 caplets by mouth once a day for vertigo   Yes [provider]  ondansetron  (ZOFRAN -ODT) 8 MG disintegrating tablet Take 1 tablet (8 mg total) by mouth every 8 (eight) hours as needed for nausea or vomiting. 01/29/24  Yes Trish Furl, MD  potassium chloride  SA (KLOR-CON  M) 20 MEQ tablet Take 1 tablet (20 mEq total) by mouth 2 (two) times daily. 01/29/24  Yes Trish Furl, MD  pregabalin (LYRICA) 150 MG capsule Take 150 mg by mouth 2 (two) times daily.   Yes [provider]  Probiotic Product (PROBIOTIC PO) Take 1 capsule by mouth daily.   Yes [provider]  rOPINIRole  (REQUIP ) 0.5 MG tablet Take 0.5 mg by mouth at bedtime. 06/03/15  Yes [provider]  rosuvastatin (CRESTOR) 10 MG tablet  Take 10 mg by mouth daily. 05/02/22  Yes [provider]  Semaglutide ,0.25 or 0.5MG /DOS, (OZEMPIC , 0.25 OR 0.5 MG/DOSE,) 2 MG/1.5ML SOPN Inject 0.5 mg into the skin every Sunday.   Yes [provider]  tiZANidine  (ZANAFLEX ) 4 MG tablet Take 4 mg by mouth at bedtime.   Yes [provider]  amoxicillin -clavulanate (AUGMENTIN ) 875-125 MG tablet Take 1 tablet by mouth every 12 (twelve) hours. Patient not taking: Reported on 01/29/2024 02/03/23   Kommor, Alyse July, MD  Benzocaine-Menthol  (CEPACOL) 15-2.3 MG LOZG  Use as directed 1 lozenge in the mouth or throat every 6 (six) hours. Patient not taking: Reported on 01/29/2024 02/03/23   Kommor, Alyse July, MD  gabapentin  (NEURONTIN ) 100 MG capsule Take 1 capsule (100 mg total) by mouth 3 (three) times daily. Patient not taking: Reported on 01/29/2024 07/04/23   Debbra Fairy, PA-C  naproxen  (NAPROSYN ) 375 MG tablet Take 1 tablet (375 mg total) by mouth 2 (two) times daily. Patient not taking: Reported on 01/29/2024 02/03/23   Kommor, Alyse July, MD  pantoprazole  (PROTONIX ) 20 MG tablet Take 1 tablet (20 mg total) by mouth daily. Patient not taking: Reported on 01/29/2024 11/18/16   Wynetta Heckle, MD  pantoprazole  (PROTONIX ) 20 MG tablet Take 1 tablet (20 mg total) by mouth daily. Patient not taking: Reported on 01/29/2024 01/08/23   Almond Army, MD      Allergies    Propoxyphene n-acetaminophen  and Prednisone    Review of Systems   Review of Systems  Neurological:  Positive for dizziness.    Physical Exam Updated Vital Signs BP (!) 184/91   Pulse 77   Temp (!) 97.2 F (36.2 C) (Oral)   Resp 19   Ht 1.676 m (5\' 6" )   Wt 119.3 kg   SpO2 94%   BMI 42.45 kg/m  Physical Exam Vitals and nursing note reviewed.  Constitutional:      General: She is not in acute distress.    Appearance: She is well-developed. She is not diaphoretic.  HENT:     Head: Normocephalic and atraumatic.     Right Ear: External ear normal.     Left Ear: External ear normal.  Eyes:     General: No scleral icterus.       Right eye: No discharge.        Left eye: No discharge.     Conjunctiva/sclera: Conjunctivae normal.  Neck:     Trachea: No tracheal deviation.  Cardiovascular:     Rate and Rhythm: Normal rate and regular rhythm.  Pulmonary:     Effort: Pulmonary effort is normal. No respiratory distress.     Breath sounds: Normal breath sounds. No stridor. No wheezing or rales.  Abdominal:     General: Bowel sounds are normal. There is no distension.     Palpations:  Abdomen is soft.     Tenderness: There is generalized abdominal tenderness. There is no guarding or rebound.  Musculoskeletal:        General: No tenderness or deformity.     Cervical back: Neck supple.  Skin:    General: Skin is warm and dry.     Findings: No rash.  Neurological:     General: No focal deficit present.     Mental Status: She is alert and oriented to person, place, and time.     Cranial Nerves: No cranial nerve deficit, dysarthria or facial asymmetry.     Sensory: No sensory deficit.     Motor: No abnormal muscle tone,  seizure activity or pronator drift.     Coordination: Coordination normal.     Comments:  able to hold both legs off bed for 5 seconds, sensation intact in all extremities,  no left or right sided neglect, abnormal finger-nose exam , pass pointing, n  Psychiatric:        Mood and Affect: Mood normal.     ED Results / Procedures / Treatments   Labs (all labs ordered are listed, but only abnormal results are displayed) Labs Reviewed  COMPREHENSIVE METABOLIC PANEL WITH GFR - Abnormal; Notable for the following components:      Result Value   Potassium 2.8 (*)    All other components within normal limits  URINALYSIS, ROUTINE W REFLEX MICROSCOPIC - Abnormal; Notable for the following components:   Hgb urine dipstick SMALL (*)    Bacteria, UA RARE (*)    All other components within normal limits  CBG MONITORING, ED - Abnormal; Notable for the following components:   Glucose-Capillary 126 (*)    All other components within normal limits  TROPONIN I (HIGH SENSITIVITY) - Abnormal; Notable for the following components:   Troponin I (High Sensitivity) 18 (*)    All other components within normal limits  TROPONIN I (HIGH SENSITIVITY) - Abnormal; Notable for the following components:   Troponin I (High Sensitivity) 22 (*)    All other components within normal limits  CBC WITH DIFFERENTIAL/PLATELET  LIPASE, BLOOD    EKG EKG  Interpretation Date/Time:  Friday Jan 29 2024 14:47:50 EDT Ventricular Rate:  80 PR Interval:  165 QRS Duration:  160 QT Interval:  433 QTC Calculation: 500 R Axis:   -7  Text Interpretation: Sinus rhythm Right bundle branch block LVH by voltage Borderline prolonged QT interval No significant change since last tracing Confirmed by Trish Furl [id number]) on 01/29/2024 4:34:31 PM  Radiology MR BRAIN WO CONTRAST Result Date: 01/29/2024 CLINICAL DATA:  Initial evaluation for acute neuro deficit, stroke suspected. EXAM: MRI HEAD WITHOUT CONTRAST TECHNIQUE: Multiplanar, multiecho pulse sequences of the brain and surrounding structures were obtained without intravenous contrast. COMPARISON:  CT from earlier the same day. FINDINGS: Brain: Cerebral volume within normal limits for age. No focal parenchymal signal abnormality. No abnormal foci of restricted diffusion to suggest acute or subacute ischemia. Gray-white matter differentiation well maintained. No encephalomalacia to suggest chronic cortical infarction or other insult. No foci of susceptibility artifact indicative of acute or chronic intracranial blood products. No mass lesion, midline shift or mass effect. Ventricles normal in size and morphology without hydrocephalus. No extra-axial fluid collection. Pituitary gland and suprasellar region within normal limits. Vascular: Major intracranial vascular flow voids are well maintained. Skull and upper cervical spine: Craniocervical junction within normal limits. Visualized upper cervical spine demonstrates no significant finding. Bone marrow signal intensity within normal limits. No scalp soft tissue abnormality. Sinuses/Orbits: Globes and orbital soft tissues are within normal limits. Paranasal sinuses are largely clear. No significant mastoid effusion. Other: None. IMPRESSION: Normal brain MRI. No acute intracranial abnormality identified. Electronically Signed   By: Virgia Griffins M.D.   On: 01/29/2024  21:07   CT Head Wo Contrast Result Date: 01/29/2024 CLINICAL DATA:  Syncope/presyncope, dizziness, nausea and vomiting. EXAM: CT HEAD WITHOUT CONTRAST TECHNIQUE: Contiguous axial images were obtained from the base of the skull through the vertex without intravenous contrast. RADIATION DOSE REDUCTION: This exam was performed according to the departmental dose-optimization program which includes automated exposure control, adjustment of the mA and/or kV according to patient size  and/or use of iterative reconstruction technique. COMPARISON:  CT head 03/04/2019. FINDINGS: Brain: No acute intracranial hemorrhage. No CT evidence of acute infarct. No edema, mass effect, or midline shift. The basilar cisterns are patent. Multiple dural calcifications again noted. Ventricles: The ventricles are normal. Vascular: Atherosclerotic calcifications of the carotid siphons. No hyperdense vessel. Skull: No acute or aggressive finding. Orbits: Orbits are symmetric. Sinuses: Mucosal thickening in the ethmoid sinuses. Other: Mastoid air cells are clear. IMPRESSION: No CT evidence of acute intracranial abnormality. Electronically Signed   By: Denny Flack M.D.   On: 01/29/2024 17:47    Procedures Procedures    Medications Ordered in ED Medications  morphine  (PF) 4 MG/ML injection 4 mg (4 mg Intravenous Given 01/29/24 1744)  potassium chloride  SA (KLOR-CON  M) CR tablet 40 mEq (40 mEq Oral Given 01/29/24 1855)  potassium chloride  10 mEq in 100 mL IVPB (0 mEq Intravenous Stopped 01/29/24 2126)  meclizine  (ANTIVERT ) tablet 25 mg (25 mg Oral Given 01/29/24 1855)  LORazepam (ATIVAN) injection 1 mg (1 mg Intravenous Given 01/29/24 1915)    ED Course/ Medical Decision Making/ A&P Clinical Course as of 01/29/24 2156  Fri Jan 29, 2024  1825 CBC normal.  Lipase normal.  Troponin slightly increased at 18.  Metabolic panel normal other than potassium decreased to 2.8. [JK]  2134 Troponin I (High Sensitivity)(!) Troponins slightly  increased at 18 and 22 but flat and unchanged.  Suspect this may be related to her hypertension [JK]  2134 MRI without acute abnormality [JK]    Clinical Course User Index [JK] Trish Furl, MD                                 Medical Decision Making Problems Addressed: Hypertension, unspecified type: chronic illness or injury with exacerbation, progression, or side effects of treatment Hypokalemia: acute illness or injury that poses a threat to life or bodily functions Vertigo: acute illness or injury that poses a threat to life or bodily functions  Amount and/or Complexity of Data Reviewed Labs: ordered. Decision-making details documented in ED Course. Radiology: ordered and independent interpretation performed.  Risk Prescription drug management.   Patient presented to the ED for evaluation of acute dizziness.  Patient has history of prior vertigo..  Is concerned about the possibility of stroke with the patient's persistent symptoms and difficulty with finger-nose exam.  Head CT and MRI were performed.  Fortunately no signs of acute stroke.  Patient symptoms improved with treatment.  Patient also noted to be hypokalemic.  She was treated with IV and oral potassium.  I will discharge her home with medications for potassium.  Troponins noted be slightly increased.  Patient's not having symptoms suggesting ACS.  I suspect this likely related to her hypertension early.  Blood pressure has improved.  Recommend she follow-up with her primary care doctor closely.  Patient did mention some abdominal pain earlier but her workup is unremarkable.  No signs of hepatitis pancreatitis.  Abdominal exam is benign.  Evaluation and diagnostic testing in the emergency department does not suggest an emergent condition requiring admission or immediate intervention beyond what has been performed at this time.  The patient is safe for discharge and has been instructed to return immediately for worsening  symptoms, change in symptoms or any other concerns.         Final Clinical Impression(s) / ED Diagnoses Final diagnoses:  Vertigo  Hypokalemia  Hypertension, unspecified type  Rx / DC Orders ED Discharge Orders          Ordered    meclizine  (ANTIVERT ) 25 MG tablet  3 times daily PRN        01/29/24 2141    potassium chloride  SA (KLOR-CON  M) 20 MEQ tablet  2 times daily        01/29/24 2141    ondansetron  (ZOFRAN -ODT) 8 MG disintegrating tablet  Every 8 hours PRN        01/29/24 2141              Trish Furl, MD 01/29/24 2156

## 2024-01-29 NOTE — ED Triage Notes (Signed)
 Intermittent dizziness that is progressively worsening since Tuesday with intermittent headaches. C/o abdominal pain

## 2024-02-02 ENCOUNTER — Other Ambulatory Visit: Payer: Self-pay

## 2024-02-02 DIAGNOSIS — Z87891 Personal history of nicotine dependence: Secondary | ICD-10-CM

## 2024-02-02 DIAGNOSIS — Z122 Encounter for screening for malignant neoplasm of respiratory organs: Secondary | ICD-10-CM

## 2024-02-02 DIAGNOSIS — F17210 Nicotine dependence, cigarettes, uncomplicated: Secondary | ICD-10-CM

## 2024-02-10 ENCOUNTER — Ambulatory Visit
Admission: RE | Admit: 2024-02-10 | Discharge: 2024-02-10 | Disposition: A | Discharge status: Home / Self Care | Source: Ambulatory Visit | Attending: Nurse Practitioner | Admitting: Nurse Practitioner

## 2024-02-10 DIAGNOSIS — Z122 Encounter for screening for malignant neoplasm of respiratory organs: Secondary | ICD-10-CM

## 2024-02-10 DIAGNOSIS — Z87891 Personal history of nicotine dependence: Secondary | ICD-10-CM

## 2024-02-10 DIAGNOSIS — F17210 Nicotine dependence, cigarettes, uncomplicated: Secondary | ICD-10-CM

## 2024-02-24 ENCOUNTER — Other Ambulatory Visit: Payer: Self-pay

## 2024-02-24 DIAGNOSIS — Z122 Encounter for screening for malignant neoplasm of respiratory organs: Secondary | ICD-10-CM

## 2024-02-24 DIAGNOSIS — F17210 Nicotine dependence, cigarettes, uncomplicated: Secondary | ICD-10-CM

## 2024-02-24 DIAGNOSIS — Z87891 Personal history of nicotine dependence: Secondary | ICD-10-CM

## 2024-07-20 ENCOUNTER — Emergency Department (HOSPITAL_BASED_OUTPATIENT_CLINIC_OR_DEPARTMENT_OTHER): Admitting: Radiology

## 2024-07-20 ENCOUNTER — Other Ambulatory Visit: Payer: Self-pay

## 2024-07-20 ENCOUNTER — Emergency Department (HOSPITAL_BASED_OUTPATIENT_CLINIC_OR_DEPARTMENT_OTHER): Admission: EM | Admit: 2024-07-20 | Discharge: 2024-07-20 | Disposition: A | Discharge status: Home / Self Care

## 2024-07-20 ENCOUNTER — Emergency Department (HOSPITAL_BASED_OUTPATIENT_CLINIC_OR_DEPARTMENT_OTHER)

## 2024-07-20 ENCOUNTER — Encounter (HOSPITAL_BASED_OUTPATIENT_CLINIC_OR_DEPARTMENT_OTHER): Payer: Self-pay | Admitting: Emergency Medicine

## 2024-07-20 DIAGNOSIS — R059 Cough, unspecified: Secondary | ICD-10-CM | POA: Diagnosis present

## 2024-07-20 DIAGNOSIS — R051 Acute cough: Secondary | ICD-10-CM

## 2024-07-20 DIAGNOSIS — D3502 Benign neoplasm of left adrenal gland: Secondary | ICD-10-CM | POA: Diagnosis not present

## 2024-07-20 DIAGNOSIS — D3501 Benign neoplasm of right adrenal gland: Secondary | ICD-10-CM | POA: Diagnosis not present

## 2024-07-20 DIAGNOSIS — J0110 Acute frontal sinusitis, unspecified: Secondary | ICD-10-CM | POA: Diagnosis not present

## 2024-07-20 DIAGNOSIS — I4510 Unspecified right bundle-branch block: Secondary | ICD-10-CM | POA: Diagnosis not present

## 2024-07-20 DIAGNOSIS — J439 Emphysema, unspecified: Secondary | ICD-10-CM | POA: Diagnosis not present

## 2024-07-20 LAB — CBC
HCT: 40.9 % (ref 36.0–46.0)
Hemoglobin: 13.6 g/dL (ref 12.0–15.0)
MCH: 28 pg (ref 26.0–34.0)
MCHC: 33.3 g/dL (ref 30.0–36.0)
MCV: 84.2 fL (ref 80.0–100.0)
Platelets: 332 K/uL (ref 150–400)
RBC: 4.86 MIL/uL (ref 3.87–5.11)
RDW: 13.7 % (ref 11.5–15.5)
WBC: 11.7 K/uL — ABNORMAL HIGH (ref 4.0–10.5)
nRBC: 0 % (ref 0.0–0.2)

## 2024-07-20 LAB — BASIC METABOLIC PANEL WITH GFR
Anion gap: 13 (ref 5–15)
BUN: 13 mg/dL (ref 8–23)
CO2: 25 mmol/L (ref 22–32)
Calcium: 9.9 mg/dL (ref 8.9–10.3)
Chloride: 104 mmol/L (ref 98–111)
Creatinine, Ser: 0.77 mg/dL (ref 0.44–1.00)
GFR, Estimated: >60 mL/min (ref >60)
Glucose, Bld: 161 mg/dL — ABNORMAL HIGH (ref 70–99)
Potassium: 3.3 mmol/L — ABNORMAL LOW (ref 3.5–5.1)
Sodium: 142 mmol/L (ref 135–145)

## 2024-07-20 LAB — RESP PANEL BY RT-PCR (RSV, FLU A&B, COVID)  RVPGX2
Influenza A by PCR: NEGATIVE
Influenza B by PCR: NEGATIVE
Resp Syncytial Virus by PCR: NEGATIVE
SARS Coronavirus 2 by RT PCR: NEGATIVE

## 2024-07-20 LAB — D-DIMER, QUANTITATIVE: D-Dimer, Quant: 0.32 ug{FEU}/mL (ref 0.00–0.50)

## 2024-07-20 MED ORDER — ALBUTEROL SULFATE (2.5 MG/3ML) 0.083% IN NEBU
2.5000 mg | INHALATION_SOLUTION | Freq: Once | RESPIRATORY_TRACT | Status: AC
  Administered 2024-07-20: 2.5 mg via RESPIRATORY_TRACT
  Filled 2024-07-20: qty 3

## 2024-07-20 MED ORDER — ALBUTEROL SULFATE HFA 108 (90 BASE) MCG/ACT IN AERS: 1.0000 | INHALATION_SPRAY | Freq: Four times a day (QID) | RESPIRATORY_TRACT | 0 refills | Status: AC | PRN

## 2024-07-20 MED ORDER — AMOXICILLIN-POT CLAVULANATE 875-125 MG PO TABS: 1.0000 | ORAL_TABLET | Freq: Two times a day (BID) | ORAL | 0 refills | Status: AC

## 2024-07-20 NOTE — ED Provider Notes (Signed)
 [place] EMERGENCY DEPARTMENT AT [facility] Provider Note   CSN: [id number] Arrival date & time: 07/20/24  1502     Patient presents with: Cough and Headache   Shannon [name] is a 68 y.o. female.    Cough Associated symptoms: headaches   Headache Associated symptoms: cough    Patient presents because of cough, headaches, congestion.  Endorsing upper respiratory congestion over the past 10 days.  Endorsing her cough.  Somewhat productive of of sputum.  Endorses chills but no documented fevers.  No exertional chest pain.  Endorsing some chest pain when she coughs hard.  Denies any history of DVT or PE.  No unilateral leg pain or swelling.  No nausea vomit diarrhea.  No sick contacts that she is aware of. No exertional chest pain.   Previous medical history reviewed : Patient last seen in the ED 01/29/24 due to dizziness. Negative workup. CT and MRI negative.        Prior to Admission medications   Medication Sig Start Date End Date Taking? Authorizing Provider  amoxicillin -clavulanate (AUGMENTIN ) 875-125 MG tablet Take 1 tablet by mouth every 12 (twelve) hours. 07/20/24  Yes Simon Lavonia SAILOR, MD  ALEVE  220 MG tablet Take 220-440 mg by mouth 2 (two) times daily as needed (for pain or headaches).    [provider]  amLODipine  (NORVASC ) 5 MG tablet Take 5 mg by mouth daily.    [provider]  amoxicillin -clavulanate (AUGMENTIN ) 875-125 MG tablet Take 1 tablet by mouth every 12 (twelve) hours. Patient not taking: Reported on 01/29/2024 02/03/23   Kommor, Lum, MD  Benzocaine-Menthol  (CEPACOL) 15-2.3 MG LOZG Use as directed 1 lozenge in the mouth or throat every 6 (six) hours. Patient not taking: Reported on 01/29/2024 02/03/23   Kommor, Lum, MD  Camphor-Eucalyptus-Menthol  [facility] VAPORUB EX) Apply 1 application  topically See admin instructions. Vicks VapoFreeze: Apply as directed to temporarily relieve minor muscle & joint aches and pains    [provider]  Cholecalciferol  (VITAMIN D3) 125 MCG (5000 UT) TABS Take 5,000 Units by mouth daily.    [provider]  DULoxetine  (CYMBALTA ) 60 MG capsule Take 60 mg by mouth daily.    [provider]  EXCEDRIN MIGRAINE RELIEF 250-250-65 MG tablet Take 2 tablets by mouth daily as needed for migraine.    [provider]  fluticasone  (FLONASE ) 50 MCG/ACT nasal spray Place 1 spray into both nostrils 2 (two) times daily as needed for allergies or rhinitis.    [provider]  gabapentin  (NEURONTIN ) 100 MG capsule Take 1 capsule (100 mg total) by mouth 3 (three) times daily. Patient not taking: Reported on 01/29/2024 07/04/23   Nivia Colon, PA-C  hydrALAZINE (APRESOLINE) 50 MG tablet Take 50 mg by mouth 2 (two) times daily.    [provider]  lactulose (CHRONULAC) 10 GM/15ML solution Take 20 g by mouth daily as needed for mild constipation.    [provider]  meclizine  (ANTIVERT ) 25 MG tablet Take 1 tablet (25 mg total) by mouth 3 (three) times daily as needed for dizziness. 01/29/24   Randol Simmonds, MD  metoCLOPramide  (REGLAN ) 10 MG tablet Take 1 tablet (10 mg total) by mouth every 6 (six) hours as needed for nausea. 01/08/23   Doretha Folks, MD  naproxen  (NAPROSYN ) 375 MG tablet Take 1 tablet (375 mg total) by mouth 2 (two) times daily. Patient not taking: Reported on 01/29/2024 02/03/23   Albertina Lum, MD  NON FORMULARY Take 2 capsules by  mouth See admin instructions. Lipo-Flavonoid Balance Support caplets: Take 1-2 caplets by mouth once a day for vertigo    [provider]  ondansetron  (ZOFRAN -ODT) 8 MG disintegrating tablet Take 1 tablet (8 mg total) by mouth every 8 (eight) hours as needed for nausea or vomiting. 01/29/24   Randol Simmonds, MD  pantoprazole  (PROTONIX ) 20 MG tablet Take 1 tablet (20 mg total) by mouth daily. Patient not taking: Reported on 01/29/2024 11/18/16   Armenta Canning, MD  pantoprazole  (PROTONIX ) 20 MG tablet Take 1  tablet (20 mg total) by mouth daily. Patient not taking: Reported on 01/29/2024 01/08/23   Doretha Folks, MD  potassium chloride  SA (KLOR-CON  M) 20 MEQ tablet Take 1 tablet (20 mEq total) by mouth 2 (two) times daily. 01/29/24   Randol Simmonds, MD  pregabalin (LYRICA) 150 MG capsule Take 150 mg by mouth 2 (two) times daily.    [provider]  Probiotic Product (PROBIOTIC PO) Take 1 capsule by mouth daily.    [provider]  rOPINIRole  (REQUIP ) 0.5 MG tablet Take 0.5 mg by mouth at bedtime. 06/03/15   [provider]  rosuvastatin (CRESTOR) 10 MG tablet Take 10 mg by mouth daily. 05/02/22   [provider]  Semaglutide ,0.25 or 0.5MG /DOS, (OZEMPIC , 0.25 OR 0.5 MG/DOSE,) 2 MG/1.5ML SOPN Inject 0.5 mg into the skin every Sunday.    [provider]  tiZANidine  (ZANAFLEX ) 4 MG tablet Take 4 mg by mouth at bedtime.    [provider]    Allergies: Propoxyphene n-acetaminophen  and Prednisone    Review of Systems  Respiratory:  Positive for cough.   Neurological:  Positive for headaches.    Updated Vital Signs BP (!) 146/77   Pulse 87   Temp 98.3 F (36.8 C) (Oral)   Resp 20   Ht 5' 6 (1.676 m)   Wt 119.3 kg   SpO2 99%   BMI 42.45 kg/m   Physical Exam Vitals and nursing note reviewed.  Constitutional:      General: She is not in acute distress.    Appearance: She is well-developed.  HENT:     Head: Normocephalic and atraumatic.  Eyes:     Conjunctiva/sclera: Conjunctivae normal.  Cardiovascular:     Rate and Rhythm: Normal rate and regular rhythm.     Heart sounds: No murmur heard. Pulmonary:     Effort: Pulmonary effort is normal. No respiratory distress.     Breath sounds: Normal breath sounds.  Abdominal:     Palpations: Abdomen is soft.     Tenderness: There is no abdominal tenderness.  Musculoskeletal:        General: No swelling.     Cervical back: Neck supple.  Skin:    General: Skin is warm and dry.     Capillary  Refill: Capillary refill takes less than 2 seconds.  Neurological:     Mental Status: She is alert.  Psychiatric:        Mood and Affect: Mood normal.     (all labs ordered are listed, but only abnormal results are displayed) Labs Reviewed  BASIC METABOLIC PANEL WITH GFR - Abnormal; Notable for the following components:      Result Value   Potassium 3.3 (*)    Glucose, Bld 161 (*)    All other components within normal limits  CBC - Abnormal; Notable for the following components:   WBC 11.7 (*)    All other components within normal limits  RESP PANEL BY RT-PCR (  RSV, FLU A&B, COVID)  [id number]  D-DIMER, QUANTITATIVE    EKG: EKG Interpretation Date/Time:  Wednesday July 20 2024 15:12:34 EST Ventricular Rate:  92 PR Interval:  152 QRS Duration:  147 QT Interval:  400 QTC Calculation: 495 R Axis:   51  Text Interpretation: Sinus rhythm Right bundle branch block Confirmed by Simon Rea [phone]) on 07/20/2024 6:44:21 PM  Radiology: CT Chest Wo Contrast Result Date: 07/20/2024 CLINICAL DATA:  Evaluate for infiltrate. EXAM: CT CHEST WITHOUT CONTRAST TECHNIQUE: Multidetector CT imaging of the chest was performed following the standard protocol without IV contrast. RADIATION DOSE REDUCTION: This exam was performed according to the departmental dose-optimization program which includes automated exposure control, adjustment of the mA and/or kV according to patient size and/or use of iterative reconstruction technique. COMPARISON:  Chest CT 02/10/2024.  CT abdomen and pelvis 02/03/2023. FINDINGS: Cardiovascular: No significant vascular findings. Normal heart size. No pericardial effusion. There are atherosclerotic calcifications of the aorta and coronary arteries. Mediastinum/Nodes: There are no enlarged mediastinal, hilar or axillary lymph nodes. Visualized esophagus and thyroid  gland are within normal limits. Lungs/Pleura: Mild emphysema present. The lungs are clear. There is no pleural  effusion or pneumothorax. There is a small fat containing right Bochdalek's hernia. Trachea and central airways are within normal limits. Upper Abdomen: There are indeterminate bilateral adrenal nodules measuring up to 13 mm which are unchanged. These were previously characterized as adenomas. Musculoskeletal: No fracture is seen. IMPRESSION: 1. No acute cardiopulmonary process. 2. Mild emphysema. 3. Stable bilateral adrenal adenomas. Aortic Atherosclerosis (ICD10-I70.0) and Emphysema (ICD10-J43.9). Electronically Signed   By: Greig Pique M.D.   On: 07/20/2024 21:23   DG Chest 2 View Result Date: 07/20/2024 CLINICAL DATA:  Chills, body aches, cough and headache x1 week. EXAM: CHEST - 2 VIEW COMPARISON:  June 28, 2021 FINDINGS: The heart size and mediastinal contours are within normal limits. Both lungs are clear. The visualized skeletal structures are unremarkable. IMPRESSION: No active cardiopulmonary disease. Electronically Signed   By: Suzen Dials M.D.   On: 07/20/2024 16:29     Procedures   Medications Ordered in the ED  albuterol  (PROVENTIL ) (2.5 MG/3ML) 0.083% nebulizer solution 2.5 mg (2.5 mg Nebulization Given 07/20/24 1517)                                    Medical Decision Making Amount and/or Complexity of Data Reviewed Labs: ordered. Radiology: ordered.  Risk Prescription drug management.     HPI:    Patient presents because of cough, headaches, congestion.  Endorsing upper respiratory congestion over the past 10 days.  Endorsing her cough.  Somewhat productive of of sputum.  Endorses chills but no documented fevers.  No exertional chest pain.  Endorsing some chest pain when she coughs hard.  Denies any history of DVT or PE.  No unilateral leg pain or swelling.  No nausea vomit diarrhea.  No sick contacts that she is aware of. No exertional chest pain.   Previous medical history reviewed : Patient last seen in the ED 01/29/24 due to dizziness. Negative workup.  CT and MRI negative.   MDM:   Upon exam, patient hemodynamically stable.  ANO x 3 GCS 15.  Maps appropriate.  O2 saturation 99% on room air.  Maybe minimal pleuritic chest pain.  Therefore, we will obtain D-dimer.  Otherwise, no risk factors for DVT or PE.   Obtain laboratory workup to make  sure there is no significant electrolyte derangement.  Will obtain COVID RSV and flu.  Obtain chest x-ray to rule out pneumonia.  Also considered differential diagnosis for sinusitis, URI viral in nature.   Reevaluation:   Upon reexamination, patient hemodynamically stable.  Remains A&O x 3 with GCS 15.  Dimer unremarkable.  Chest x-ray unremarkable.  Therefore, did obtain CT chest without contrast assessment Underlying pneumonia that the x-ray of the chest missed.  CT chest unremarkable.  No evidence of pneumonia.  Negative COVID RSV and flu.  No large electrolyte derangements.  Speaking to the patient further, she has had quite a bit of sinus pressure and pain for at least 10 days.  Given this, I am concerned for possible bacterial sinusitis so we will start patient on Augmentin  outpatient.  I think this is reasonable based off of presentation.  Once again, no concerns for pneumonia.  She will follow-up with her PCP about her bilateral adrenal adenomas that been stable in size.   Interventions: augmentin    EKG Interpreted by Me: sinus    Cardiac Tele Interpreted by Me: sinus    I have independently interpreted the CXR  and CT  images and agree with the radiologist finding   Social Determinant of Health: none    Disposition and Follow Up: pcp       Final diagnoses:  Acute non-recurrent frontal sinusitis  Acute cough    ED Discharge Orders          Ordered    amoxicillin -clavulanate (AUGMENTIN ) 875-125 MG tablet  Every 12 hours        07/20/24 2140               Simon Lavonia SAILOR, MD 07/20/24 2142

## 2024-07-20 NOTE — Discharge Instructions (Signed)
 IMPRESSION:  1. No acute cardiopulmonary process.  2. Mild emphysema.  3. Stable bilateral adrenal adenomas    Please follow-up with your primary care physician about ongoing imaging and monitoring of the bilateral adrenal adenomas.  I will start you on Augmentin  given that you have been having this sinusitis symptoms for approximately 10 days.  If you have any kind of worsening fever, chills, chest pain or shortness of breath then please come back to the ED for further evaluation.

## 2024-07-20 NOTE — ED Notes (Signed)
 Pt d/c instructions, medications, and follow-up care reviewed with pt. Pt verbalized understanding and had no further questions at time of d/c. Pt CA&Ox4 and in NAD at time of d/c

## 2024-07-20 NOTE — ED Triage Notes (Signed)
 Pt via pov from home with chills, body aches, cough, headache x 1 week -10 days. Pt has been taking otc meds but is not getting any better. Pt states the cough makes her chest hurt. A&o x 4; nad noted.

## 2024-09-19 ENCOUNTER — Emergency Department (HOSPITAL_BASED_OUTPATIENT_CLINIC_OR_DEPARTMENT_OTHER): Admitting: Radiology

## 2024-09-19 ENCOUNTER — Other Ambulatory Visit: Payer: Self-pay

## 2024-09-19 ENCOUNTER — Encounter (HOSPITAL_BASED_OUTPATIENT_CLINIC_OR_DEPARTMENT_OTHER): Payer: Self-pay | Admitting: *Deleted

## 2024-09-19 ENCOUNTER — Emergency Department (HOSPITAL_BASED_OUTPATIENT_CLINIC_OR_DEPARTMENT_OTHER)
Admission: EM | Admit: 2024-09-19 | Discharge: 2024-09-19 | Disposition: A | Discharge status: Home / Self Care | Attending: Emergency Medicine | Admitting: Emergency Medicine

## 2024-09-19 DIAGNOSIS — I10 Essential (primary) hypertension: Secondary | ICD-10-CM | POA: Insufficient documentation

## 2024-09-19 DIAGNOSIS — M25511 Pain in right shoulder: Secondary | ICD-10-CM | POA: Diagnosis present

## 2024-09-19 DIAGNOSIS — Z79899 Other long term (current) drug therapy: Secondary | ICD-10-CM | POA: Diagnosis not present

## 2024-09-19 MED ORDER — KETOROLAC TROMETHAMINE 15 MG/ML IJ SOLN
15.0000 mg | Freq: Once | INTRAMUSCULAR | Status: AC
  Administered 2024-09-19: 15 mg via INTRAMUSCULAR
  Filled 2024-09-19: qty 1

## 2024-09-19 MED ORDER — TRAMADOL HCL 50 MG PO TABS: 50.0000 mg | ORAL_TABLET | Freq: Four times a day (QID) | ORAL | 0 refills | Status: AC | PRN

## 2024-09-19 MED ORDER — TRAMADOL HCL 50 MG PO TABS
50.0000 mg | ORAL_TABLET | Freq: Once | ORAL | Status: AC
  Administered 2024-09-19: 50 mg via ORAL
  Filled 2024-09-19: qty 1

## 2024-09-19 NOTE — ED Provider Notes (Signed)
 " [name] EMERGENCY DEPARTMENT AT [facility] Provider Note   CSN: [id number] Arrival date & time: 09/19/24  1124     Patient presents with: Shoulder Pain   Shannon [name] is a 69 y.o. female.  presents with right shoulder pain for the past few months.  Denies any falls or injury.  She states that she woke up 1 day with the right shoulder pain.  Patient notes the pain is worse with movement.  She states that the pain is diffuse and radiates to her neck.  Denies swelling of the shoulder.  Patient has taken ibuprofen  with her relief.  She has also applied a heating pad without relief.  Last took ibuprofen  yesterday.  Patient has a history of prediabetes and is taking Ozempic .  Denies fevers, chills, cough, shortness of breath, chest pain, back pain, abdominal pain, nausea, vomiting, diarrhea, urinary symptoms, numbness or tingling, headaches, dizziness, any other symptoms at this time.  No history of kidney disease.  Rates her pain a 9 out of 10.  Patient notes she always has high blood pressure and is taking 2 blood pressure medication.  Only noted allergy to prednisone.       Shoulder Pain      Prior to Admission medications  Medication Sig Start Date End Date Taking? Authorizing Provider  traMADol  (ULTRAM ) 50 MG tablet Take 1 tablet (50 mg total) by mouth every 6 (six) hours as needed for up to 3 days. 09/19/24 09/22/24 Yes Eathon Valade, PA-C  albuterol  (VENTOLIN  HFA) 108 (90 Base) MCG/ACT inhaler Inhale 1-2 puffs into the lungs every 6 (six) hours as needed for wheezing or shortness of breath. 07/20/24   Simon Lavonia SAILOR, MD  ALEVE  220 MG tablet Take 220-440 mg by mouth 2 (two) times daily as needed (for pain or headaches).    [provider]  amLODipine  (NORVASC ) 5 MG tablet Take 5 mg by mouth daily.    [provider]  amoxicillin -clavulanate (AUGMENTIN ) 875-125 MG tablet Take 1 tablet by mouth every 12 (twelve) hours. Patient not taking: Reported on  01/29/2024 02/03/23   Kommor, Lum, MD  amoxicillin -clavulanate (AUGMENTIN ) 875-125 MG tablet Take 1 tablet by mouth every 12 (twelve) hours. 07/20/24   Simon Lavonia SAILOR, MD  Benzocaine-Menthol  (CEPACOL) 15-2.3 MG LOZG Use as directed 1 lozenge in the mouth or throat every 6 (six) hours. Patient not taking: Reported on 01/29/2024 02/03/23   Kommor, Lum, MD  Camphor-Eucalyptus-Menthol  [facility] VAPORUB EX) Apply 1 application  topically See admin instructions. Vicks VapoFreeze: Apply as directed to temporarily relieve minor muscle & joint aches and pains    [provider]  Cholecalciferol  (VITAMIN D3) 125 MCG (5000 UT) TABS Take 5,000 Units by mouth daily.    [provider]  DULoxetine  (CYMBALTA ) 60 MG capsule Take 60 mg by mouth daily.    [provider]  EXCEDRIN MIGRAINE RELIEF 250-250-65 MG tablet Take 2 tablets by mouth daily as needed for migraine.    [provider]  fluticasone  (FLONASE ) 50 MCG/ACT nasal spray Place 1 spray into both nostrils 2 (two) times daily as needed for allergies or rhinitis.    [provider]  gabapentin  (NEURONTIN ) 100 MG capsule Take 1 capsule (100 mg total) by mouth 3 (three) times daily. Patient not taking: Reported on 01/29/2024 07/04/23   Nivia Colon, PA-C  hydrALAZINE (APRESOLINE) 50 MG tablet Take 50 mg by mouth 2 (two) times daily.    [provider]  lactulose (CHRONULAC) 10 GM/15ML solution  Take 20 g by mouth daily as needed for mild constipation.    [provider]  meclizine  (ANTIVERT ) 25 MG tablet Take 1 tablet (25 mg total) by mouth 3 (three) times daily as needed for dizziness. 01/29/24   Randol Simmonds, MD  metoCLOPramide  (REGLAN ) 10 MG tablet Take 1 tablet (10 mg total) by mouth every 6 (six) hours as needed for nausea. 01/08/23   Doretha Folks, MD  naproxen  (NAPROSYN ) 375 MG tablet Take 1 tablet (375 mg total) by mouth 2 (two) times daily. Patient not taking: Reported on 01/29/2024 02/03/23    Kommor, Lum, MD  NON FORMULARY Take 2 capsules by mouth See admin instructions. Lipo-Flavonoid Balance Support caplets: Take 1-2 caplets by mouth once a day for vertigo    [provider]  ondansetron  (ZOFRAN -ODT) 8 MG disintegrating tablet Take 1 tablet (8 mg total) by mouth every 8 (eight) hours as needed for nausea or vomiting. 01/29/24   Randol Simmonds, MD  pantoprazole  (PROTONIX ) 20 MG tablet Take 1 tablet (20 mg total) by mouth daily. Patient not taking: Reported on 01/29/2024 11/18/16   Armenta Canning, MD  pantoprazole  (PROTONIX ) 20 MG tablet Take 1 tablet (20 mg total) by mouth daily. Patient not taking: Reported on 01/29/2024 01/08/23   Doretha Folks, MD  potassium chloride  SA (KLOR-CON  M) 20 MEQ tablet Take 1 tablet (20 mEq total) by mouth 2 (two) times daily. 01/29/24   Randol Simmonds, MD  pregabalin (LYRICA) 150 MG capsule Take 150 mg by mouth 2 (two) times daily.    [provider]  Probiotic Product (PROBIOTIC PO) Take 1 capsule by mouth daily.    [provider]  rOPINIRole  (REQUIP ) 0.5 MG tablet Take 0.5 mg by mouth at bedtime. 06/03/15   [provider]  rosuvastatin (CRESTOR) 10 MG tablet Take 10 mg by mouth daily. 05/02/22   [provider]  Semaglutide ,0.25 or 0.5MG /DOS, (OZEMPIC , 0.25 OR 0.5 MG/DOSE,) 2 MG/1.5ML SOPN Inject 0.5 mg into the skin every Sunday.    [provider]  tiZANidine  (ZANAFLEX ) 4 MG tablet Take 4 mg by mouth at bedtime.    [provider]    Allergies: Propoxyphene n-acetaminophen  and Prednisone    Review of Systems  Musculoskeletal:        Shoulder pain    Updated Vital Signs BP (!) 195/109 (BP Location: Right Wrist)   Pulse 87   Temp 98.2 F (36.8 C) (Oral)   Resp 18   SpO2 96%   Physical Exam Vitals and nursing note reviewed.  Constitutional:      General: She is not in acute distress.    Appearance: She is well-developed. She is obese. She is not ill-appearing, toxic-appearing or  diaphoretic.  HENT:     Head: Normocephalic and atraumatic.  Eyes:     Conjunctiva/sclera: Conjunctivae normal.  Cardiovascular:     Rate and Rhythm: Normal rate and regular rhythm.     Heart sounds: No murmur heard. Pulmonary:     Effort: Pulmonary effort is normal. No respiratory distress.     Breath sounds: Normal breath sounds.  Abdominal:     Palpations: Abdomen is soft.     Tenderness: There is no abdominal tenderness.  Musculoskeletal:        General: No swelling.     Right shoulder: Tenderness present. No swelling, deformity, effusion, laceration, bony tenderness or crepitus. Decreased range of motion. Decreased strength. Normal pulse.     Left shoulder: Normal.     Right upper  arm: Normal.     Left upper arm: Normal.     Right elbow: Normal.     Left elbow: Normal.     Right forearm: Normal.     Left forearm: Normal.     Right wrist: Normal.     Left wrist: Normal.     Right hand: Normal.     Left hand: Normal.     Cervical back: Normal, full passive range of motion without pain, normal range of motion and neck supple. No edema or torticollis. No pain with movement, spinous process tenderness or muscular tenderness. Normal range of motion.     Thoracic back: Normal.     Comments: Diffuse tenderness of the right anterior shoulder, range of motion limited due to pain.  5 out of 5 muscle strength to abduction. Sensation intact.  2+ radial pulse bilaterally.  Skin:    General: Skin is warm and dry.     Capillary Refill: Capillary refill takes less than 2 seconds.  Neurological:     Mental Status: She is alert.  Psychiatric:        Mood and Affect: Mood normal.     (all labs ordered are listed, but only abnormal results are displayed) Labs Reviewed - No data to display  EKG: None  Radiology: DG Shoulder Right Result Date: 09/19/2024 CLINICAL DATA:  Ongoing shoulder pain. EXAM: RIGHT SHOULDER - 2+ VIEW COMPARISON:  None Available. FINDINGS: No acute fracture or  dislocation. 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 Mild degenerative changes of the glenohumeral joint with inferior glenoid osteophytosis. Soft tissues are unremarkable. IMPRESSION: 1. No acute osseous abnormality. 2. 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 3. Mild osteoarthritis of the glenohumeral joint. Electronically Signed   By: Harrietta Sherry M.D.   On: 09/19/2024 14:12     Procedures   Medications Ordered in the ED  ketorolac  (TORADOL ) 15 MG/ML injection 15 mg (15 mg Intramuscular Given 09/19/24 1334)  traMADol  (ULTRAM ) tablet 50 mg (50 mg Oral Given 09/19/24 1445)                                    Medical Decision Making Amount and/or Complexity of Data Reviewed Radiology: ordered.  Risk Prescription drug management.   69 year old patient with a history of prediabetes and hypertension presenting today with right shoulder pain that started a few months ago.  She notes the pain is worse with movement.  Denies falls or any injuries.  On exam she is alert and oriented with no apparent distress.  There is diffuse tenderness to the anterior right shoulder.  Active range of motion is limited due to pain however passive range of motion is intact in all motions.  No signs of warmth, erythema of the shoulder.  Normal range of motion of the neck, back, wrist, elbow.  Sensation intact.  Abdomen soft nontender.  2+ radial pulse bilaterally.  Hypertensive 195/109 upon initial evaluation.  She noted she always has high blood pressure.  Other vital signs stable.  Differential diagnoses include arthritis, strain, fracture, dislocation.   Right shoulder x-ray did reveal osteoarthritis and degenerative changes without any signs of fractures or dislocation.  Discussed this with the patient.  Patient was given Toradol  here in here which did not provide relief.  She was also given tramadol  which helped her pain.  Given her chronic  pain, will refer patient to orthopedic for further evaluation.  Will also prescribe a short course of pain medication.  Recommended patient to apply heat to the area of pain and take ibuprofen  as needed.  Hemodynamically stable.  I recommended patient to follow-up with PCP for further evaluation of high blood pressure.  Patient is in agreement with plan and is stable for discharge.    Case discussed with Dr. Jerrol, MD, who is in agreement with plan.      Final diagnoses:  Right shoulder pain, unspecified chronicity  Primary hypertension    ED Discharge Orders          Ordered    traMADol  (ULTRAM ) 50 MG tablet  Every 6 hours PRN        09/19/24 1456               Braxton Dubois, PA-C 09/19/24 1501    Jerrol Agent, MD 09/19/24 1517  "

## 2024-09-19 NOTE — Discharge Instructions (Addendum)
 Your workup is reassuring here.  X-ray did not show any signs of fractures or dislocation.  Please schedule an appointment with orthopedic for further evaluation.  Take Tramadol  as needed for pain every 4-6 hours.  Do not drink alcohol while taking tramadol .  Can take Tylenol  or ibuprofen .  Apply heat or ice to the area of pain.  Rest the affected shoulder and avoid activities that worsen pain.  Avoid overhead activities and heavy lifting.  Please follow-up with primary care doctor for evaluation of high blood pressure.  Return for worsening pain, swelling, fevers, numbness or tingling.

## 2024-09-19 NOTE — ED Triage Notes (Signed)
 Pt is here for evaluation of right shoulder pain x several months.  Pt denies any injury to this shoulder.  She states that she has had lots of popping in her shoulder and it has been difficult to do certain things such as putting on her bra.  Pain increases when she moves her arm.

## 2024-09-19 NOTE — ED Notes (Signed)
 Pt d/c instructions, medications, and follow-up care reviewed with pt. Pt verbalized understanding and had no further questions at time of d/c. Pt CA&Ox4, ambulatory, and in NAD at time of d/c
